# Patient Record
Sex: Female | Born: 1975
Health system: Southern US, Community
[De-identification: ages and names within clinical notes are randomized; demographics above are authoritative.]

## PROBLEM LIST (undated history)

## (undated) DIAGNOSIS — M8580 Other specified disorders of bone density and structure, unspecified site: Secondary | ICD-10-CM

## (undated) DIAGNOSIS — G8929 Other chronic pain: Secondary | ICD-10-CM

## (undated) DIAGNOSIS — F32A Depression, unspecified: Secondary | ICD-10-CM

## (undated) DIAGNOSIS — R32 Unspecified urinary incontinence: Secondary | ICD-10-CM

## (undated) DIAGNOSIS — N319 Neuromuscular dysfunction of bladder, unspecified: Secondary | ICD-10-CM

## (undated) DIAGNOSIS — M549 Dorsalgia, unspecified: Secondary | ICD-10-CM

## (undated) DIAGNOSIS — F329 Major depressive disorder, single episode, unspecified: Secondary | ICD-10-CM

## (undated) DIAGNOSIS — G8220 Paraplegia, unspecified: Secondary | ICD-10-CM

## (undated) DIAGNOSIS — F419 Anxiety disorder, unspecified: Secondary | ICD-10-CM

## (undated) DIAGNOSIS — S3992XA Unspecified injury of lower back, initial encounter: Secondary | ICD-10-CM

## (undated) HISTORY — PX: KNEE ARTHROSCOPY: SUR90

## (undated) HISTORY — DX: Other specified disorders of bone density and structure, unspecified site: M85.80

## (undated) HISTORY — DX: Unspecified urinary incontinence: R32

## (undated) HISTORY — PX: JOINT REPLACEMENT: SHX530

---

## 2001-02-01 ENCOUNTER — Ambulatory Visit (HOSPITAL_COMMUNITY): Admission: RE | Admit: 2001-02-01 | Discharge: 2001-02-01 | Payer: Self-pay | Admitting: *Deleted

## 2001-02-01 ENCOUNTER — Encounter: Payer: Self-pay | Admitting: *Deleted

## 2001-02-21 ENCOUNTER — Emergency Department (HOSPITAL_COMMUNITY): Admission: EM | Admit: 2001-02-21 | Discharge: 2001-02-21 | Payer: Self-pay | Admitting: Emergency Medicine

## 2001-03-11 ENCOUNTER — Other Ambulatory Visit: Admission: RE | Admit: 2001-03-11 | Discharge: 2001-03-11 | Payer: Self-pay | Admitting: Obstetrics and Gynecology

## 2001-08-19 ENCOUNTER — Inpatient Hospital Stay (HOSPITAL_COMMUNITY): Admission: AD | Admit: 2001-08-19 | Discharge: 2001-08-21 | Payer: Self-pay | Admitting: Obstetrics and Gynecology

## 2002-03-21 ENCOUNTER — Encounter: Payer: Self-pay | Admitting: *Deleted

## 2002-03-21 ENCOUNTER — Emergency Department (HOSPITAL_COMMUNITY): Admission: EM | Admit: 2002-03-21 | Discharge: 2002-03-21 | Payer: Self-pay | Admitting: *Deleted

## 2003-03-29 ENCOUNTER — Emergency Department (HOSPITAL_COMMUNITY): Admission: EM | Admit: 2003-03-29 | Discharge: 2003-03-29 | Payer: Self-pay | Admitting: Emergency Medicine

## 2003-07-18 ENCOUNTER — Encounter (HOSPITAL_COMMUNITY): Admission: RE | Admit: 2003-07-18 | Discharge: 2003-08-17 | Payer: Self-pay | Admitting: Emergency Medicine

## 2003-07-18 ENCOUNTER — Emergency Department (HOSPITAL_COMMUNITY): Admission: EM | Admit: 2003-07-18 | Discharge: 2003-07-18 | Payer: Self-pay | Admitting: Emergency Medicine

## 2003-08-03 ENCOUNTER — Encounter (HOSPITAL_COMMUNITY): Admission: RE | Admit: 2003-08-03 | Discharge: 2003-09-02 | Payer: Self-pay | Admitting: Podiatry

## 2003-09-05 ENCOUNTER — Encounter (HOSPITAL_COMMUNITY): Admission: RE | Admit: 2003-09-05 | Discharge: 2003-10-05 | Payer: Self-pay | Admitting: Podiatry

## 2003-10-26 ENCOUNTER — Emergency Department (HOSPITAL_COMMUNITY): Admission: EM | Admit: 2003-10-26 | Discharge: 2003-10-27 | Payer: Self-pay | Admitting: Emergency Medicine

## 2003-11-22 ENCOUNTER — Inpatient Hospital Stay (HOSPITAL_COMMUNITY): Admission: EM | Admit: 2003-11-22 | Discharge: 2003-11-26 | Payer: Self-pay | Admitting: Psychiatry

## 2005-05-05 ENCOUNTER — Ambulatory Visit: Admission: RE | Admit: 2005-05-05 | Discharge: 2005-05-05 | Payer: Self-pay | Admitting: Internal Medicine

## 2005-07-27 ENCOUNTER — Ambulatory Visit: Payer: Self-pay | Admitting: Orthopedic Surgery

## 2005-08-14 ENCOUNTER — Ambulatory Visit: Payer: Self-pay | Admitting: Orthopedic Surgery

## 2005-08-14 ENCOUNTER — Ambulatory Visit (HOSPITAL_COMMUNITY): Admission: RE | Admit: 2005-08-14 | Discharge: 2005-08-14 | Payer: Self-pay | Admitting: Orthopedic Surgery

## 2005-08-17 ENCOUNTER — Ambulatory Visit: Payer: Self-pay | Admitting: Orthopedic Surgery

## 2005-08-19 ENCOUNTER — Ambulatory Visit: Payer: Self-pay | Admitting: Orthopedic Surgery

## 2005-08-21 ENCOUNTER — Ambulatory Visit: Payer: Self-pay | Admitting: Orthopedic Surgery

## 2005-08-21 ENCOUNTER — Ambulatory Visit (HOSPITAL_COMMUNITY): Admission: RE | Admit: 2005-08-21 | Discharge: 2005-08-21 | Payer: Self-pay | Admitting: Orthopedic Surgery

## 2005-08-24 ENCOUNTER — Ambulatory Visit: Payer: Self-pay | Admitting: Orthopedic Surgery

## 2005-09-07 ENCOUNTER — Ambulatory Visit: Payer: Self-pay | Admitting: Orthopedic Surgery

## 2005-09-17 ENCOUNTER — Ambulatory Visit: Payer: Self-pay | Admitting: Orthopedic Surgery

## 2005-09-28 ENCOUNTER — Encounter: Admission: RE | Admit: 2005-09-28 | Discharge: 2005-10-27 | Payer: Self-pay | Admitting: Orthopedic Surgery

## 2005-11-16 ENCOUNTER — Ambulatory Visit: Payer: Self-pay | Admitting: Orthopedic Surgery

## 2005-11-27 ENCOUNTER — Ambulatory Visit (HOSPITAL_COMMUNITY): Admission: RE | Admit: 2005-11-27 | Discharge: 2005-11-27 | Payer: Self-pay | Admitting: Orthopedic Surgery

## 2006-02-25 ENCOUNTER — Emergency Department (HOSPITAL_COMMUNITY): Admission: EM | Admit: 2006-02-25 | Discharge: 2006-02-25 | Payer: Self-pay | Admitting: Emergency Medicine

## 2006-03-25 ENCOUNTER — Ambulatory Visit: Payer: Self-pay | Admitting: Orthopedic Surgery

## 2006-06-14 ENCOUNTER — Emergency Department (HOSPITAL_COMMUNITY): Admission: EM | Admit: 2006-06-14 | Discharge: 2006-06-14 | Payer: Self-pay | Admitting: Emergency Medicine

## 2007-02-26 ENCOUNTER — Emergency Department (HOSPITAL_COMMUNITY): Admission: EM | Admit: 2007-02-26 | Discharge: 2007-02-26 | Payer: Self-pay | Admitting: Emergency Medicine

## 2007-04-24 ENCOUNTER — Encounter: Payer: Self-pay | Admitting: Orthopedic Surgery

## 2007-05-23 ENCOUNTER — Ambulatory Visit: Payer: Self-pay | Admitting: Orthopedic Surgery

## 2007-05-23 DIAGNOSIS — M25569 Pain in unspecified knee: Secondary | ICD-10-CM | POA: Insufficient documentation

## 2007-05-23 DIAGNOSIS — M4716 Other spondylosis with myelopathy, lumbar region: Secondary | ICD-10-CM | POA: Insufficient documentation

## 2007-06-20 ENCOUNTER — Telehealth: Payer: Self-pay | Admitting: Orthopedic Surgery

## 2007-09-06 ENCOUNTER — Encounter: Admission: RE | Admit: 2007-09-06 | Discharge: 2007-11-08 | Payer: Self-pay | Admitting: Orthopedic Surgery

## 2007-09-08 ENCOUNTER — Telehealth: Payer: Self-pay | Admitting: Orthopedic Surgery

## 2007-10-12 ENCOUNTER — Other Ambulatory Visit: Admission: RE | Admit: 2007-10-12 | Discharge: 2007-10-12 | Payer: Self-pay | Admitting: Obstetrics and Gynecology

## 2008-01-03 ENCOUNTER — Inpatient Hospital Stay (HOSPITAL_COMMUNITY): Admission: AD | Admit: 2008-01-03 | Discharge: 2008-01-03 | Payer: Self-pay | Admitting: Obstetrics & Gynecology

## 2008-05-09 ENCOUNTER — Ambulatory Visit: Payer: Self-pay | Admitting: Obstetrics and Gynecology

## 2008-05-09 ENCOUNTER — Inpatient Hospital Stay (HOSPITAL_COMMUNITY): Admission: AD | Admit: 2008-05-09 | Discharge: 2008-05-09 | Payer: Self-pay | Admitting: Obstetrics and Gynecology

## 2008-05-10 ENCOUNTER — Inpatient Hospital Stay (HOSPITAL_COMMUNITY): Admission: AD | Admit: 2008-05-10 | Discharge: 2008-05-12 | Payer: Self-pay | Admitting: Obstetrics & Gynecology

## 2008-05-10 ENCOUNTER — Ambulatory Visit: Payer: Self-pay | Admitting: Obstetrics & Gynecology

## 2009-09-22 ENCOUNTER — Ambulatory Visit: Payer: Self-pay | Admitting: Vascular Surgery

## 2009-09-22 ENCOUNTER — Emergency Department (HOSPITAL_COMMUNITY): Admission: EM | Admit: 2009-09-22 | Discharge: 2009-09-22 | Payer: Self-pay | Admitting: Emergency Medicine

## 2010-07-22 LAB — RH IMMUNE GLOB WKUP(>/=20WKS)(NOT WOMEN'S HOSP)

## 2010-07-22 LAB — CBC
HCT: 40.7 % (ref 36.0–46.0)
Hemoglobin: 13.8 g/dL (ref 12.0–15.0)
Platelets: 191 10*3/uL (ref 150–400)
RBC: 4 MIL/uL (ref 3.87–5.11)

## 2010-07-22 LAB — RAPID URINE DRUG SCREEN, HOSP PERFORMED
Benzodiazepines: NOT DETECTED
Opiates: NOT DETECTED

## 2010-07-22 LAB — RPR: RPR Ser Ql: NONREACTIVE

## 2010-08-22 NOTE — Op Note (Signed)
NAME:  Kim [name], Kim [name] NO.:  [id number]   MEDICAL RECORD NO.:  [id number]          PATIENT TYPE:  AMB   LOCATION:  DAY                           FACILITY:  [facility]   PHYSICIAN:  Vickki Hearing, M.D.DATE OF BIRTH:  02-18-1976   DATE OF PROCEDURE:  08/21/2005  DATE OF DISCHARGE:                                 OPERATIVE REPORT   PREOPERATIVE DIAGNOSIS:  Wound dehiscence and hematoma left knee.   POSTOPERATIVE DIAGNOSIS:  Wound dehiscence and hematoma left knee.   PROCEDURE:  Evacuation of hematoma, closure of wound left knee.   SURGEON:  Vickki Hearing, M.D.   ASSISTANT:  No assistants.   ANESTHETIC:  Local lidocaine 1% with epinephrine.   INDICATIONS FOR PROCEDURE AND HISTORY:  Ms. Cagley had a left knee  arthroscopy.  She had two large loose bodies removed from the left knee  through a suprapatellar lateral portal.  The sutures were removed in the  office, and 2 days later the wound started to drain dark clotted hematoma,  so we booked her for surgery to have the hematoma evacuated and the wound  closed.   The patient was identified as [name].  Her left knee was confirmed  as the surgical site.  The wound was prepped and draped with Betadine.  Local 1% lidocaine with epinephrine was used to anesthetize the wound.  Clot  was evacuated from the wound. and the wound was closed in layered fashion  with 2-0 Monocryl and 3-0 nylon in interrupted fashion, and a dressing was  applied with a knee immobilizer, and the patient was discharged home with  Percocet and Levaquin 500 mg 1 a day for 7 days.  The Percocet is a 5 mg  tablet, #40. The patient will follow up on Monday for wound check. Sutures  should stay in for approximately 13 days.      Vickki Hearing, M.D.  Electronically Signed     SEH/MEDQ  D:  08/21/2005  T:  08/21/2005  Job:  [id number]

## 2010-08-22 NOTE — H&P (Signed)
NAME:  Kim [name], Kim [name] NO.:  [id number]   MEDICAL RECORD NO.:  [id number]          PATIENT TYPE:  AMB   LOCATION:  DAY                           FACILITY:  [facility]   PHYSICIAN:  Vickki Hearing, M.D.DATE OF BIRTH:  05-Nov-1975   DATE OF ADMISSION:  DATE OF DISCHARGE:  LH                                HISTORY & PHYSICAL   CHIEF COMPLAINT:  Drainage from left knee.   MEDICAL HISTORY:  She is 35 years old. She is a L3 level paraplegic. She had  an arthroscopic to remove two loose bodies from the left knee and develop  postoperative wound drainage and hematoma formation. She is coming in today  for evacuation of the hematoma manually and closure of the wound.   OTHER HISTORY:  Osteoporosis. No allergies. Spinal cord injury in 1990. No  other previous surgery. She takes oxybutynin for her bladder. Takes Flexeril  and Percocet.   FAMILY HISTORY:  Arthritis, cancer and diabetes.   FAMILY PHYSICIAN:  Dr. Olena Leatherwood.   SOCIAL HISTORY:  The patient is single, mother of five. She is a Consulting civil engineer.   PHYSICAL EXAMINATION:  VITAL SIGNS:  Weight approximately 150, pulse 92,  respiratory rate 16.  EXTREMITIES:  Her left knee arthrotomy incision is opened up. There is  drainage from the wound.   I had left it open purposely to allow it to drain. I will clean it up and  numb and close the wound on  Friday. She came to the office on Wednesday.   There are no other surrounding sounds of the infection of the knee joint  itself. Passive range of motion is uncomfortable but not rigid or causing  extreme pain. She does have some narcotic tolerance.   She has a large bruise over the lateral portion of the leg which is related  to the hematoma.   IMPRESSION:  Hematoma and wound opening. Recommend closure and evacuation of  hematoma.      Vickki Hearing, M.D.  Electronically Signed     SEH/MEDQ  D:  08/19/2005  T:  08/19/2005  Job:  [id number]

## 2010-08-22 NOTE — Op Note (Signed)
NAME:  Kim [name], Kim [name] NO.:  [id number]   MEDICAL RECORD NO.:  [id number]          PATIENT TYPE:  AMB   LOCATION:  DAY                           FACILITY:  [facility]   PHYSICIAN:  Vickki Hearing, M.D.DATE OF BIRTH:  [birth year]   DATE OF PROCEDURE:  08/14/2005  DATE OF DISCHARGE:                                 OPERATIVE REPORT   PREOPERATIVE DIAGNOSIS:  Loose bodies left knee, degenerative joint disease.   POSTOPERATIVE DIAGNOSIS:  Loose bodies left knee, degenerative joint  disease.   PROCEDURE:  Arthroscopy left knee, removal of loose bodies, chondroplasty  medial femoral condyle.   OPERATIVE FINDINGS:  There were two large loose bodies in the lateral  gutter. There was a pedunculated soft tissue and the notch appeared to be  coming from the ACL.  There was a chondral blister over the medial femoral  condyle which was debrided. Underneath this blister was exposed bone.  There  was degeneration of the joint, I believe this to be a neuropathic joint.   SURGEON:  Vickki Hearing, M.D.   ASSISTANT:  None.   ANESTHESIA:  General.   SPECIMENS:  No specimens.   BLOOD LOSS:  Minimal.   COMPLICATIONS:  None.   DISPOSITION:  The patient went to PACU in good condition.   INDICATIONS FOR PROCEDURE:  This patient, who is an L3 level spinal cord  injury patient, still had intact knee extension, was having a feeling that  something was moving around and getting caught in the joint when she played  on the floor with her son.  She was initially told she needed a knee  replacement and came to me for a second opinion. Radiographs showed loose  bodies and I recommended arthroscopic removal.   DESCRIPTION OF PROCEDURE:  This patient was identified in the preop holding  area. She marked her left knee as a surgical site, I counter signed that. I  updated her history and physical and she was given Ancef.  She was taken to  the operating room for general anesthesia  via LMA.  After successful  anesthesia, the patient was placed supine on the operating table where her  left leg was prepped with DuraPrep. After sterile draping, the time-out was  taken and completed successfully.   We then performed a diagnostic arthroscopy via lateral portal. After finding  the loose body and pedunculated lesion, we made an initial attempt to remove  the loose bodies through the medial portal.  However, the loose bodies were  too big.  So a suprapatellar lateral portal mini-arthrotomy was performed  and loose bodies were removed via that portal.  The knee was irrigated,  inspected for any further loose pieces of cartilage or bone, there were  none.  We debrided the pedunculated lesion in the notch, controlled  hemostasis with an ArthroCare wand, and irrigated the knee.  We closed the  arthrotomy portal with 3-0 nylon as well as closing the portals with 3-0  nylon.  We applied sterile dressings. We placed Ace bandage and a Cryo/Cuff.  She was extubated and taken to the recovery  room in stable condition.  She  will be followed on Monday, to use a Cryo/Cuff over the weekend, no change  of dressing is needed.      Vickki Hearing, M.D.  Electronically Signed     SEH/MEDQ  D:  08/14/2005  T:  08/14/2005  Job:  [id number]
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
## 2010-12-03 ENCOUNTER — Encounter: Payer: Self-pay | Admitting: *Deleted

## 2010-12-03 ENCOUNTER — Emergency Department (HOSPITAL_COMMUNITY)
Admission: EM | Admit: 2010-12-03 | Discharge: 2010-12-03 | Disposition: A | Discharge status: Home / Self Care | Payer: Medicare Other | Attending: Emergency Medicine | Admitting: Emergency Medicine

## 2010-12-03 DIAGNOSIS — M25569 Pain in unspecified knee: Secondary | ICD-10-CM | POA: Insufficient documentation

## 2010-12-03 DIAGNOSIS — F17200 Nicotine dependence, unspecified, uncomplicated: Secondary | ICD-10-CM | POA: Insufficient documentation

## 2010-12-03 DIAGNOSIS — M79609 Pain in unspecified limb: Secondary | ICD-10-CM | POA: Insufficient documentation

## 2010-12-03 DIAGNOSIS — W050XXA Fall from non-moving wheelchair, initial encounter: Secondary | ICD-10-CM | POA: Insufficient documentation

## 2010-12-03 DIAGNOSIS — M549 Dorsalgia, unspecified: Secondary | ICD-10-CM | POA: Insufficient documentation

## 2010-12-03 DIAGNOSIS — Y92009 Unspecified place in unspecified non-institutional (private) residence as the place of occurrence of the external cause: Secondary | ICD-10-CM | POA: Insufficient documentation

## 2010-12-03 DIAGNOSIS — T148XXA Other injury of unspecified body region, initial encounter: Secondary | ICD-10-CM

## 2010-12-03 HISTORY — DX: Paraplegia, unspecified: G82.20

## 2010-12-03 HISTORY — DX: Neuromuscular dysfunction of bladder, unspecified: N31.9

## 2010-12-03 HISTORY — DX: Unspecified injury of lower back, initial encounter: S39.92XA

## 2010-12-03 MED ORDER — HYDROCODONE-ACETAMINOPHEN 5-325 MG PO TABS: ORAL_TABLET | ORAL | Status: DC

## 2010-12-03 MED ORDER — IBUPROFEN 800 MG PO TABS: 800.0000 mg | ORAL_TABLET | Freq: Three times a day (TID) | ORAL | Status: AC

## 2010-12-03 NOTE — ED Notes (Signed)
Pt states that she fell yesterday, states that she has recently moved to a new residence and the cabinets are higher, pt was attempting to stand up when she fell, pt c/o pain to lower back, left knee, right thigh and spasms to left foot.
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
## 2010-12-04 NOTE — ED Provider Notes (Signed)
Medical screening examination/treatment/procedure(s) were performed by non-physician practitioner and as supervising physician I was immediately available for consultation/collaboration.   Vida Roller, MD 12/04/10 [phone]

## 2010-12-16 ENCOUNTER — Other Ambulatory Visit: Payer: Self-pay | Admitting: Advanced Practice Midwife

## 2010-12-16 DIAGNOSIS — IMO0002 Reserved for concepts with insufficient information to code with codable children: Secondary | ICD-10-CM

## 2010-12-30 ENCOUNTER — Encounter (HOSPITAL_COMMUNITY): Payer: Self-pay

## 2010-12-30 ENCOUNTER — Emergency Department (HOSPITAL_COMMUNITY)
Admission: EM | Admit: 2010-12-30 | Discharge: 2010-12-30 | Disposition: A | Discharge status: Home / Self Care | Payer: Medicare Other | Attending: Emergency Medicine | Admitting: Emergency Medicine

## 2010-12-30 ENCOUNTER — Emergency Department (HOSPITAL_COMMUNITY): Payer: Medicare Other

## 2010-12-30 DIAGNOSIS — R296 Repeated falls: Secondary | ICD-10-CM | POA: Insufficient documentation

## 2010-12-30 DIAGNOSIS — F17200 Nicotine dependence, unspecified, uncomplicated: Secondary | ICD-10-CM | POA: Insufficient documentation

## 2010-12-30 DIAGNOSIS — G839 Paralytic syndrome, unspecified: Secondary | ICD-10-CM | POA: Insufficient documentation

## 2010-12-30 DIAGNOSIS — M25561 Pain in right knee: Secondary | ICD-10-CM

## 2010-12-30 DIAGNOSIS — S8000XA Contusion of unspecified knee, initial encounter: Secondary | ICD-10-CM | POA: Insufficient documentation

## 2010-12-30 DIAGNOSIS — W19XXXA Unspecified fall, initial encounter: Secondary | ICD-10-CM

## 2010-12-30 DIAGNOSIS — M25569 Pain in unspecified knee: Secondary | ICD-10-CM | POA: Insufficient documentation

## 2010-12-30 DIAGNOSIS — Y92009 Unspecified place in unspecified non-institutional (private) residence as the place of occurrence of the external cause: Secondary | ICD-10-CM | POA: Insufficient documentation

## 2010-12-30 MED ORDER — HYDROCODONE-ACETAMINOPHEN 5-325 MG PO TABS
1.0000 | ORAL_TABLET | Freq: Once | ORAL | Status: AC
  Administered 2010-12-30: 1 via ORAL
  Filled 2010-12-30: qty 1

## 2010-12-30 MED ORDER — IBUPROFEN 800 MG PO TABS
800.0000 mg | ORAL_TABLET | Freq: Once | ORAL | Status: AC
  Administered 2010-12-30: 800 mg via ORAL
  Filled 2010-12-30: qty 1

## 2010-12-30 MED ORDER — HYDROCODONE-ACETAMINOPHEN 5-325 MG PO TABS: ORAL_TABLET | ORAL | Status: AC
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
## 2010-12-30 NOTE — ED Notes (Signed)
Pt seen here 8/29 for same injury. Referred to Dr. Phillips Odor.

## 2010-12-31 ENCOUNTER — Other Ambulatory Visit: Payer: Self-pay | Admitting: Advanced Practice Midwife

## 2010-12-31 ENCOUNTER — Ambulatory Visit (HOSPITAL_COMMUNITY)
Admission: RE | Admit: 2010-12-31 | Discharge: 2010-12-31 | Disposition: A | Discharge status: Home / Self Care | Payer: Medicare Other | Source: Ambulatory Visit | Attending: Advanced Practice Midwife | Admitting: Advanced Practice Midwife

## 2010-12-31 DIAGNOSIS — IMO0002 Reserved for concepts with insufficient information to code with codable children: Secondary | ICD-10-CM

## 2010-12-31 DIAGNOSIS — N63 Unspecified lump in unspecified breast: Secondary | ICD-10-CM | POA: Insufficient documentation

## 2011-01-01 ENCOUNTER — Other Ambulatory Visit (HOSPITAL_COMMUNITY): Payer: Self-pay | Admitting: Orthopaedic Surgery

## 2011-01-01 DIAGNOSIS — W19XXXA Unspecified fall, initial encounter: Secondary | ICD-10-CM

## 2011-01-01 DIAGNOSIS — M25469 Effusion, unspecified knee: Secondary | ICD-10-CM

## 2011-01-01 DIAGNOSIS — M25562 Pain in left knee: Secondary | ICD-10-CM

## 2011-01-06 ENCOUNTER — Ambulatory Visit (HOSPITAL_COMMUNITY)
Admission: RE | Admit: 2011-01-06 | Discharge: 2011-01-06 | Disposition: A | Discharge status: Home / Self Care | Payer: Medicare Other | Source: Ambulatory Visit | Attending: Orthopaedic Surgery | Admitting: Orthopaedic Surgery

## 2011-01-06 DIAGNOSIS — M25469 Effusion, unspecified knee: Secondary | ICD-10-CM

## 2011-01-06 DIAGNOSIS — W19XXXA Unspecified fall, initial encounter: Secondary | ICD-10-CM

## 2011-01-06 DIAGNOSIS — M25562 Pain in left knee: Secondary | ICD-10-CM

## 2011-01-13 ENCOUNTER — Ambulatory Visit (HOSPITAL_COMMUNITY)
Admission: RE | Admit: 2011-01-13 | Discharge: 2011-01-13 | Disposition: A | Discharge status: Home / Self Care | Payer: Medicare Other | Source: Ambulatory Visit | Attending: Orthopaedic Surgery | Admitting: Orthopaedic Surgery

## 2011-01-13 DIAGNOSIS — S8990XA Unspecified injury of unspecified lower leg, initial encounter: Secondary | ICD-10-CM | POA: Insufficient documentation

## 2011-01-13 DIAGNOSIS — W19XXXA Unspecified fall, initial encounter: Secondary | ICD-10-CM | POA: Insufficient documentation

## 2011-01-13 DIAGNOSIS — S99929A Unspecified injury of unspecified foot, initial encounter: Secondary | ICD-10-CM | POA: Insufficient documentation

## 2011-01-13 DIAGNOSIS — M2340 Loose body in knee, unspecified knee: Secondary | ICD-10-CM | POA: Insufficient documentation

## 2011-01-13 DIAGNOSIS — M25569 Pain in unspecified knee: Secondary | ICD-10-CM | POA: Insufficient documentation

## 2011-02-08 DIAGNOSIS — M25562 Pain in left knee: Secondary | ICD-10-CM | POA: Insufficient documentation

## 2011-03-23 DIAGNOSIS — M47815 Spondylosis without myelopathy or radiculopathy, thoracolumbar region: Secondary | ICD-10-CM | POA: Insufficient documentation

## 2011-03-23 DIAGNOSIS — M179 Osteoarthritis of knee, unspecified: Secondary | ICD-10-CM | POA: Insufficient documentation

## 2011-04-18 ENCOUNTER — Emergency Department (HOSPITAL_COMMUNITY): Payer: Medicare Other

## 2011-04-18 ENCOUNTER — Emergency Department (HOSPITAL_COMMUNITY)
Admission: EM | Admit: 2011-04-18 | Discharge: 2011-04-18 | Disposition: A | Discharge status: Home / Self Care | Payer: Medicare Other | Attending: Emergency Medicine | Admitting: Emergency Medicine

## 2011-04-18 ENCOUNTER — Encounter (HOSPITAL_COMMUNITY): Payer: Self-pay | Admitting: Emergency Medicine

## 2011-04-18 DIAGNOSIS — G8929 Other chronic pain: Secondary | ICD-10-CM | POA: Diagnosis not present

## 2011-04-18 DIAGNOSIS — F341 Dysthymic disorder: Secondary | ICD-10-CM | POA: Diagnosis not present

## 2011-04-18 DIAGNOSIS — M25559 Pain in unspecified hip: Secondary | ICD-10-CM | POA: Insufficient documentation

## 2011-04-18 DIAGNOSIS — M545 Low back pain, unspecified: Secondary | ICD-10-CM | POA: Insufficient documentation

## 2011-04-18 DIAGNOSIS — M1610 Unilateral primary osteoarthritis, unspecified hip: Secondary | ICD-10-CM | POA: Diagnosis not present

## 2011-04-18 DIAGNOSIS — M810 Age-related osteoporosis without current pathological fracture: Secondary | ICD-10-CM | POA: Insufficient documentation

## 2011-04-18 DIAGNOSIS — M25551 Pain in right hip: Secondary | ICD-10-CM

## 2011-04-18 DIAGNOSIS — M169 Osteoarthritis of hip, unspecified: Secondary | ICD-10-CM | POA: Diagnosis not present

## 2011-04-18 DIAGNOSIS — M1990 Unspecified osteoarthritis, unspecified site: Secondary | ICD-10-CM

## 2011-04-18 HISTORY — DX: Other chronic pain: G89.29

## 2011-04-18 HISTORY — DX: Anxiety disorder, unspecified: F41.9

## 2011-04-18 HISTORY — DX: Major depressive disorder, single episode, unspecified: F32.9

## 2011-04-18 HISTORY — DX: Dorsalgia, unspecified: M54.9

## 2011-04-18 HISTORY — DX: Depression, unspecified: F32.A

## 2011-04-18 MED ORDER — ONDANSETRON HCL 4 MG PO TABS
4.0000 mg | ORAL_TABLET | Freq: Once | ORAL | Status: AC
  Administered 2011-04-18: 4 mg via ORAL
  Filled 2011-04-18: qty 1

## 2011-04-18 MED ORDER — HYDROCODONE-ACETAMINOPHEN 5-325 MG PO TABS: 1.0000 | ORAL_TABLET | ORAL | Status: AC | PRN

## 2011-04-18 MED ORDER — HYDROCODONE-ACETAMINOPHEN 5-325 MG PO TABS
2.0000 | ORAL_TABLET | Freq: Once | ORAL | Status: AC
  Administered 2011-04-18: 2 via ORAL
  Filled 2011-04-18: qty 2

## 2011-04-18 MED ORDER — IBUPROFEN 800 MG PO TABS
800.0000 mg | ORAL_TABLET | Freq: Once | ORAL | Status: AC
  Administered 2011-04-18: 800 mg via ORAL
  Filled 2011-04-18: qty 1

## 2011-04-18 MED ORDER — DICLOFENAC SODIUM 75 MG PO TBEC: DELAYED_RELEASE_TABLET | ORAL | Status: DC

## 2011-04-18 NOTE — ED Notes (Signed)
Pt a/ox4. Resp even and unlabored. NAD at this time. D/C instructions reviewed with pt. Pt verbalized understanding. Pt to lobby via w/c.  

## 2011-04-18 NOTE — ED Notes (Signed)
Pt presents with lower right sided back pain that radiates to right hip. Pt denies injury. Pt states she has an appointment with a spine specialist in Feb. Pt ambulated to treatment room with steady gate. NAD at this time.

## 2011-04-18 NOTE — ED Notes (Signed)
Patient c/o lower right back that radiates into right hip. Patient reports appointment with spinal specialist on Feb 19.
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
## 2011-04-19 NOTE — ED Provider Notes (Signed)
Medical screening examination/treatment/procedure(s) were performed by non-physician practitioner and as supervising physician I was immediately available for consultation/collaboration.  Caylan Chenard L Dawne Casali, MD 04/19/11 0033 

## 2011-05-26 DIAGNOSIS — IMO0002 Reserved for concepts with insufficient information to code with codable children: Secondary | ICD-10-CM | POA: Diagnosis not present

## 2011-05-26 DIAGNOSIS — M545 Low back pain, unspecified: Secondary | ICD-10-CM | POA: Insufficient documentation

## 2011-05-26 DIAGNOSIS — S343XXA Injury of cauda equina, initial encounter: Secondary | ICD-10-CM | POA: Insufficient documentation

## 2011-06-08 ENCOUNTER — Other Ambulatory Visit: Payer: Self-pay

## 2011-06-08 ENCOUNTER — Other Ambulatory Visit (HOSPITAL_COMMUNITY): Payer: Self-pay | Admitting: Orthopedic Surgery

## 2011-06-08 DIAGNOSIS — M545 Low back pain: Secondary | ICD-10-CM

## 2011-06-09 ENCOUNTER — Other Ambulatory Visit (HOSPITAL_COMMUNITY): Payer: Medicare Other

## 2011-06-23 DIAGNOSIS — Z3202 Encounter for pregnancy test, result negative: Secondary | ICD-10-CM | POA: Diagnosis not present

## 2011-06-23 DIAGNOSIS — N915 Oligomenorrhea, unspecified: Secondary | ICD-10-CM | POA: Diagnosis not present

## 2011-06-23 DIAGNOSIS — N946 Dysmenorrhea, unspecified: Secondary | ICD-10-CM | POA: Diagnosis not present

## 2011-06-23 DIAGNOSIS — Z1389 Encounter for screening for other disorder: Secondary | ICD-10-CM | POA: Diagnosis not present

## 2011-06-24 ENCOUNTER — Other Ambulatory Visit (HOSPITAL_COMMUNITY): Payer: Self-pay | Admitting: *Deleted

## 2011-06-24 DIAGNOSIS — M545 Low back pain: Secondary | ICD-10-CM

## 2011-06-29 ENCOUNTER — Inpatient Hospital Stay (HOSPITAL_COMMUNITY): Admission: RE | Admit: 2011-06-29 | Payer: Medicare Other | Source: Ambulatory Visit

## 2011-06-29 ENCOUNTER — Other Ambulatory Visit (HOSPITAL_COMMUNITY): Payer: Self-pay

## 2011-07-23 ENCOUNTER — Encounter (HOSPITAL_COMMUNITY): Payer: Self-pay | Admitting: *Deleted

## 2011-07-23 ENCOUNTER — Emergency Department (HOSPITAL_COMMUNITY): Payer: Medicare Other

## 2011-07-23 ENCOUNTER — Emergency Department (HOSPITAL_COMMUNITY)
Admission: EM | Admit: 2011-07-23 | Discharge: 2011-07-23 | Disposition: A | Discharge status: Home / Self Care | Payer: Medicare Other | Attending: Emergency Medicine | Admitting: Emergency Medicine

## 2011-07-23 DIAGNOSIS — G839 Paralytic syndrome, unspecified: Secondary | ICD-10-CM | POA: Insufficient documentation

## 2011-07-23 DIAGNOSIS — S81009A Unspecified open wound, unspecified knee, initial encounter: Secondary | ICD-10-CM | POA: Insufficient documentation

## 2011-07-23 DIAGNOSIS — Z87828 Personal history of other (healed) physical injury and trauma: Secondary | ICD-10-CM | POA: Diagnosis not present

## 2011-07-23 DIAGNOSIS — M545 Low back pain, unspecified: Secondary | ICD-10-CM | POA: Diagnosis not present

## 2011-07-23 DIAGNOSIS — L97409 Non-pressure chronic ulcer of unspecified heel and midfoot with unspecified severity: Secondary | ICD-10-CM | POA: Insufficient documentation

## 2011-07-23 DIAGNOSIS — F17200 Nicotine dependence, unspecified, uncomplicated: Secondary | ICD-10-CM | POA: Diagnosis not present

## 2011-07-23 DIAGNOSIS — W050XXA Fall from non-moving wheelchair, initial encounter: Secondary | ICD-10-CM | POA: Insufficient documentation

## 2011-07-23 DIAGNOSIS — S91009A Unspecified open wound, unspecified ankle, initial encounter: Secondary | ICD-10-CM | POA: Diagnosis not present

## 2011-07-23 DIAGNOSIS — M7989 Other specified soft tissue disorders: Secondary | ICD-10-CM | POA: Diagnosis not present

## 2011-07-23 DIAGNOSIS — M533 Sacrococcygeal disorders, not elsewhere classified: Secondary | ICD-10-CM | POA: Diagnosis not present

## 2011-07-23 DIAGNOSIS — S81809A Unspecified open wound, unspecified lower leg, initial encounter: Secondary | ICD-10-CM | POA: Diagnosis not present

## 2011-07-23 DIAGNOSIS — S81819A Laceration without foreign body, unspecified lower leg, initial encounter: Secondary | ICD-10-CM

## 2011-07-23 MED ORDER — TRAMADOL HCL 50 MG PO TABS: ORAL_TABLET | ORAL | Status: DC

## 2011-07-23 NOTE — Discharge Instructions (Signed)
Your xrays do not show any fractures. Your skin loss on your right heel should heal well, use the triple antibiotic ointment on it as we discussed and elevate your heel in the air by placing a pillow under your ankle so the skin doesn't keep getting rubbed off. Recheck if you get a fever, see a red streak, increased redness of swelling of your heal/lower leg or the area starts to drain pus.

## 2011-07-23 NOTE — ED Notes (Signed)
Pt alert and oriented x 3. Skin warm and dry. Color pink. Breath sounds clear and equal bilaterally. Pt has large abrasion on right heel that is red and oozing small amount serous drainage. Pt c/o pain in her lower back radiating to her right hip. Pt states that she fell out of her wheelchair while going backwards down the steps. Pt denies hitting her head or loss of consciousness.
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
## 2011-08-26 ENCOUNTER — Other Ambulatory Visit (HOSPITAL_COMMUNITY): Payer: Self-pay | Admitting: Orthopedic Surgery

## 2011-08-26 DIAGNOSIS — M545 Low back pain: Secondary | ICD-10-CM

## 2011-09-01 ENCOUNTER — Ambulatory Visit (HOSPITAL_COMMUNITY): Admission: RE | Admit: 2011-09-01 | Payer: Medicare Other | Source: Ambulatory Visit

## 2011-09-04 DIAGNOSIS — M545 Low back pain: Secondary | ICD-10-CM | POA: Diagnosis not present

## 2011-09-04 DIAGNOSIS — Z79899 Other long term (current) drug therapy: Secondary | ICD-10-CM | POA: Diagnosis not present

## 2011-09-04 DIAGNOSIS — G608 Other hereditary and idiopathic neuropathies: Secondary | ICD-10-CM | POA: Diagnosis not present

## 2011-09-04 DIAGNOSIS — M629 Disorder of muscle, unspecified: Secondary | ICD-10-CM | POA: Diagnosis not present

## 2011-09-04 DIAGNOSIS — H81399 Other peripheral vertigo, unspecified ear: Secondary | ICD-10-CM | POA: Diagnosis not present

## 2011-09-04 DIAGNOSIS — M533 Sacrococcygeal disorders, not elsewhere classified: Secondary | ICD-10-CM | POA: Diagnosis not present

## 2011-09-19 ENCOUNTER — Encounter (HOSPITAL_COMMUNITY): Payer: Self-pay | Admitting: *Deleted

## 2011-09-19 ENCOUNTER — Emergency Department (HOSPITAL_COMMUNITY): Payer: Medicare Other

## 2011-09-19 ENCOUNTER — Emergency Department (HOSPITAL_COMMUNITY)
Admission: EM | Admit: 2011-09-19 | Discharge: 2011-09-19 | Disposition: A | Discharge status: Home / Self Care | Payer: Medicare Other | Attending: Emergency Medicine | Admitting: Emergency Medicine

## 2011-09-19 DIAGNOSIS — M25562 Pain in left knee: Secondary | ICD-10-CM

## 2011-09-19 DIAGNOSIS — F411 Generalized anxiety disorder: Secondary | ICD-10-CM | POA: Diagnosis not present

## 2011-09-19 DIAGNOSIS — M1710 Unilateral primary osteoarthritis, unspecified knee: Secondary | ICD-10-CM | POA: Diagnosis not present

## 2011-09-19 DIAGNOSIS — G8929 Other chronic pain: Secondary | ICD-10-CM | POA: Diagnosis not present

## 2011-09-19 DIAGNOSIS — F329 Major depressive disorder, single episode, unspecified: Secondary | ICD-10-CM | POA: Diagnosis not present

## 2011-09-19 DIAGNOSIS — M810 Age-related osteoporosis without current pathological fracture: Secondary | ICD-10-CM | POA: Insufficient documentation

## 2011-09-19 DIAGNOSIS — M25569 Pain in unspecified knee: Secondary | ICD-10-CM | POA: Diagnosis not present

## 2011-09-19 DIAGNOSIS — F17200 Nicotine dependence, unspecified, uncomplicated: Secondary | ICD-10-CM | POA: Insufficient documentation

## 2011-09-19 DIAGNOSIS — F3289 Other specified depressive episodes: Secondary | ICD-10-CM | POA: Insufficient documentation

## 2011-09-19 MED ORDER — HYDROMORPHONE HCL PF 1 MG/ML IJ SOLN
1.0000 mg | Freq: Once | INTRAMUSCULAR | Status: AC
  Administered 2011-09-19: 1 mg via INTRAMUSCULAR
  Filled 2011-09-19: qty 1

## 2011-09-19 MED ORDER — OXYCODONE-ACETAMINOPHEN 5-325 MG PO TABS: 1.0000 | ORAL_TABLET | Freq: Four times a day (QID) | ORAL | Status: AC | PRN

## 2011-09-19 NOTE — Discharge Instructions (Signed)
Xray showed no fracture.  Ice.  Pain meds.  F/u your orthopedic dr.  Donivan Scull wrap
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
## 2011-09-19 NOTE — ED Notes (Signed)
Pain in left knee, states she is usually able to crawl and today she cannot crawl due to pain in left knee, paralyzed in both feet , history of osteoporosis

## 2011-09-23 ENCOUNTER — Ambulatory Visit (HOSPITAL_COMMUNITY): Payer: Medicare Other

## 2011-09-24 ENCOUNTER — Ambulatory Visit (HOSPITAL_COMMUNITY)
Admission: RE | Admit: 2011-09-24 | Discharge: 2011-09-24 | Disposition: A | Discharge status: Home / Self Care | Payer: Medicare Other | Source: Ambulatory Visit | Attending: Orthopedic Surgery | Admitting: Orthopedic Surgery

## 2011-09-24 DIAGNOSIS — M545 Low back pain, unspecified: Secondary | ICD-10-CM | POA: Diagnosis not present

## 2011-09-24 DIAGNOSIS — G839 Paralytic syndrome, unspecified: Secondary | ICD-10-CM | POA: Insufficient documentation

## 2011-09-24 DIAGNOSIS — M5137 Other intervertebral disc degeneration, lumbosacral region: Secondary | ICD-10-CM | POA: Insufficient documentation

## 2011-09-24 DIAGNOSIS — M51379 Other intervertebral disc degeneration, lumbosacral region without mention of lumbar back pain or lower extremity pain: Secondary | ICD-10-CM | POA: Insufficient documentation

## 2011-10-02 DIAGNOSIS — G608 Other hereditary and idiopathic neuropathies: Secondary | ICD-10-CM | POA: Diagnosis not present

## 2011-10-02 DIAGNOSIS — M545 Low back pain: Secondary | ICD-10-CM | POA: Diagnosis not present

## 2011-10-02 DIAGNOSIS — M533 Sacrococcygeal disorders, not elsewhere classified: Secondary | ICD-10-CM | POA: Diagnosis not present

## 2011-10-02 DIAGNOSIS — H81399 Other peripheral vertigo, unspecified ear: Secondary | ICD-10-CM | POA: Diagnosis not present

## 2011-10-02 DIAGNOSIS — Z79899 Other long term (current) drug therapy: Secondary | ICD-10-CM | POA: Diagnosis not present

## 2011-11-06 DIAGNOSIS — H81399 Other peripheral vertigo, unspecified ear: Secondary | ICD-10-CM | POA: Diagnosis not present

## 2011-11-06 DIAGNOSIS — G608 Other hereditary and idiopathic neuropathies: Secondary | ICD-10-CM | POA: Diagnosis not present

## 2011-11-06 DIAGNOSIS — M545 Low back pain: Secondary | ICD-10-CM | POA: Diagnosis not present

## 2011-11-06 DIAGNOSIS — M533 Sacrococcygeal disorders, not elsewhere classified: Secondary | ICD-10-CM | POA: Diagnosis not present

## 2011-11-12 ENCOUNTER — Emergency Department (HOSPITAL_COMMUNITY)
Admission: EM | Admit: 2011-11-12 | Discharge: 2011-11-12 | Disposition: A | Discharge status: Home / Self Care | Payer: Medicare Other | Attending: Emergency Medicine | Admitting: Emergency Medicine

## 2011-11-12 ENCOUNTER — Emergency Department (HOSPITAL_COMMUNITY): Payer: Medicare Other

## 2011-11-12 ENCOUNTER — Encounter (HOSPITAL_COMMUNITY): Payer: Self-pay | Admitting: *Deleted

## 2011-11-12 DIAGNOSIS — G8929 Other chronic pain: Secondary | ICD-10-CM | POA: Insufficient documentation

## 2011-11-12 DIAGNOSIS — L97509 Non-pressure chronic ulcer of other part of unspecified foot with unspecified severity: Secondary | ICD-10-CM | POA: Insufficient documentation

## 2011-11-12 DIAGNOSIS — F17200 Nicotine dependence, unspecified, uncomplicated: Secondary | ICD-10-CM | POA: Insufficient documentation

## 2011-11-12 DIAGNOSIS — M810 Age-related osteoporosis without current pathological fracture: Secondary | ICD-10-CM | POA: Insufficient documentation

## 2011-11-12 DIAGNOSIS — L8990 Pressure ulcer of unspecified site, unspecified stage: Secondary | ICD-10-CM | POA: Diagnosis not present

## 2011-11-12 DIAGNOSIS — L89609 Pressure ulcer of unspecified heel, unspecified stage: Secondary | ICD-10-CM | POA: Diagnosis not present

## 2011-11-12 DIAGNOSIS — L97519 Non-pressure chronic ulcer of other part of right foot with unspecified severity: Secondary | ICD-10-CM

## 2011-11-12 DIAGNOSIS — L97409 Non-pressure chronic ulcer of unspecified heel and midfoot with unspecified severity: Secondary | ICD-10-CM | POA: Diagnosis not present

## 2011-11-12 MED ORDER — CLINDAMYCIN HCL 300 MG PO CAPS: 300.0000 mg | ORAL_CAPSULE | Freq: Four times a day (QID) | ORAL | Status: AC

## 2011-11-12 NOTE — ED Notes (Addendum)
Infection, malodorous for 2-3 days.  Rt foot.  Heel is black.  No feeling in feet.

## 2011-11-12 NOTE — ED Notes (Signed)
Pt stable at discharge Pt instructed to take all antibotics till finished Pt verbalized understanding

## 2011-11-12 NOTE — ED Notes (Signed)
Right heel is back with odor present; Pt states that she hit it a month ago, and that the infection just started.
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
## 2011-12-03 ENCOUNTER — Encounter: Payer: Self-pay | Admitting: Family Medicine

## 2011-12-03 ENCOUNTER — Ambulatory Visit: Payer: Medicare Other | Admitting: Family Medicine

## 2011-12-04 DIAGNOSIS — Z79899 Other long term (current) drug therapy: Secondary | ICD-10-CM | POA: Diagnosis not present

## 2011-12-04 DIAGNOSIS — M542 Cervicalgia: Secondary | ICD-10-CM | POA: Diagnosis not present

## 2011-12-04 DIAGNOSIS — H81399 Other peripheral vertigo, unspecified ear: Secondary | ICD-10-CM | POA: Diagnosis not present

## 2011-12-04 DIAGNOSIS — M533 Sacrococcygeal disorders, not elsewhere classified: Secondary | ICD-10-CM | POA: Diagnosis not present

## 2011-12-04 DIAGNOSIS — M545 Low back pain: Secondary | ICD-10-CM | POA: Diagnosis not present

## 2011-12-04 DIAGNOSIS — G608 Other hereditary and idiopathic neuropathies: Secondary | ICD-10-CM | POA: Diagnosis not present

## 2011-12-22 DIAGNOSIS — F329 Major depressive disorder, single episode, unspecified: Secondary | ICD-10-CM | POA: Diagnosis not present

## 2011-12-22 DIAGNOSIS — N319 Neuromuscular dysfunction of bladder, unspecified: Secondary | ICD-10-CM | POA: Diagnosis not present

## 2011-12-22 DIAGNOSIS — Z79899 Other long term (current) drug therapy: Secondary | ICD-10-CM | POA: Diagnosis not present

## 2011-12-22 DIAGNOSIS — L97509 Non-pressure chronic ulcer of other part of unspecified foot with unspecified severity: Secondary | ICD-10-CM | POA: Diagnosis not present

## 2011-12-22 DIAGNOSIS — L8992 Pressure ulcer of unspecified site, stage 2: Secondary | ICD-10-CM | POA: Diagnosis not present

## 2011-12-22 DIAGNOSIS — M810 Age-related osteoporosis without current pathological fracture: Secondary | ICD-10-CM | POA: Diagnosis not present

## 2011-12-22 DIAGNOSIS — M2459 Contracture, other specified joint: Secondary | ICD-10-CM | POA: Diagnosis not present

## 2011-12-22 DIAGNOSIS — G8220 Paraplegia, unspecified: Secondary | ICD-10-CM | POA: Diagnosis not present

## 2011-12-22 DIAGNOSIS — L89609 Pressure ulcer of unspecified heel, unspecified stage: Secondary | ICD-10-CM | POA: Diagnosis not present

## 2012-01-04 ENCOUNTER — Ambulatory Visit: Payer: Medicare Other | Admitting: Family Medicine

## 2012-01-06 DIAGNOSIS — R0989 Other specified symptoms and signs involving the circulatory and respiratory systems: Secondary | ICD-10-CM | POA: Diagnosis not present

## 2012-01-06 DIAGNOSIS — L8992 Pressure ulcer of unspecified site, stage 2: Secondary | ICD-10-CM | POA: Diagnosis not present

## 2012-01-06 DIAGNOSIS — L97409 Non-pressure chronic ulcer of unspecified heel and midfoot with unspecified severity: Secondary | ICD-10-CM | POA: Diagnosis not present

## 2012-01-06 DIAGNOSIS — L8990 Pressure ulcer of unspecified site, unspecified stage: Secondary | ICD-10-CM | POA: Diagnosis not present

## 2012-01-06 DIAGNOSIS — L89609 Pressure ulcer of unspecified heel, unspecified stage: Secondary | ICD-10-CM | POA: Diagnosis not present

## 2012-01-06 DIAGNOSIS — L84 Corns and callosities: Secondary | ICD-10-CM | POA: Diagnosis not present

## 2012-01-06 DIAGNOSIS — I739 Peripheral vascular disease, unspecified: Secondary | ICD-10-CM | POA: Diagnosis not present

## 2012-01-06 DIAGNOSIS — G8220 Paraplegia, unspecified: Secondary | ICD-10-CM | POA: Diagnosis not present

## 2012-01-11 DIAGNOSIS — R42 Dizziness and giddiness: Secondary | ICD-10-CM | POA: Diagnosis not present

## 2012-01-11 DIAGNOSIS — M542 Cervicalgia: Secondary | ICD-10-CM | POA: Diagnosis not present

## 2012-01-11 DIAGNOSIS — G608 Other hereditary and idiopathic neuropathies: Secondary | ICD-10-CM | POA: Diagnosis not present

## 2012-01-11 DIAGNOSIS — H8190 Unspecified disorder of vestibular function, unspecified ear: Secondary | ICD-10-CM | POA: Diagnosis not present

## 2012-01-11 DIAGNOSIS — Z79899 Other long term (current) drug therapy: Secondary | ICD-10-CM | POA: Diagnosis not present

## 2012-01-11 DIAGNOSIS — M545 Low back pain: Secondary | ICD-10-CM | POA: Diagnosis not present

## 2012-01-11 DIAGNOSIS — H81399 Other peripheral vertigo, unspecified ear: Secondary | ICD-10-CM | POA: Diagnosis not present

## 2012-01-11 DIAGNOSIS — M533 Sacrococcygeal disorders, not elsewhere classified: Secondary | ICD-10-CM | POA: Diagnosis not present

## 2012-02-08 DIAGNOSIS — H81399 Other peripheral vertigo, unspecified ear: Secondary | ICD-10-CM | POA: Diagnosis not present

## 2012-02-08 DIAGNOSIS — H8190 Unspecified disorder of vestibular function, unspecified ear: Secondary | ICD-10-CM | POA: Diagnosis not present

## 2012-02-08 DIAGNOSIS — Z79899 Other long term (current) drug therapy: Secondary | ICD-10-CM | POA: Diagnosis not present

## 2012-02-08 DIAGNOSIS — M545 Low back pain: Secondary | ICD-10-CM | POA: Diagnosis not present

## 2012-02-08 DIAGNOSIS — G608 Other hereditary and idiopathic neuropathies: Secondary | ICD-10-CM | POA: Diagnosis not present

## 2012-03-09 DIAGNOSIS — H81399 Other peripheral vertigo, unspecified ear: Secondary | ICD-10-CM | POA: Diagnosis not present

## 2012-03-09 DIAGNOSIS — G608 Other hereditary and idiopathic neuropathies: Secondary | ICD-10-CM | POA: Diagnosis not present

## 2012-03-09 DIAGNOSIS — Z79899 Other long term (current) drug therapy: Secondary | ICD-10-CM | POA: Diagnosis not present

## 2012-03-09 DIAGNOSIS — H8190 Unspecified disorder of vestibular function, unspecified ear: Secondary | ICD-10-CM | POA: Diagnosis not present

## 2012-03-09 DIAGNOSIS — M545 Low back pain: Secondary | ICD-10-CM | POA: Diagnosis not present

## 2012-04-11 DIAGNOSIS — Z79899 Other long term (current) drug therapy: Secondary | ICD-10-CM | POA: Diagnosis not present

## 2012-04-11 DIAGNOSIS — M542 Cervicalgia: Secondary | ICD-10-CM | POA: Diagnosis not present

## 2012-04-11 DIAGNOSIS — G608 Other hereditary and idiopathic neuropathies: Secondary | ICD-10-CM | POA: Diagnosis not present

## 2012-04-11 DIAGNOSIS — H8190 Unspecified disorder of vestibular function, unspecified ear: Secondary | ICD-10-CM | POA: Diagnosis not present

## 2012-04-11 DIAGNOSIS — H81399 Other peripheral vertigo, unspecified ear: Secondary | ICD-10-CM | POA: Diagnosis not present

## 2012-04-11 DIAGNOSIS — M545 Low back pain: Secondary | ICD-10-CM | POA: Diagnosis not present

## 2012-05-16 DIAGNOSIS — G608 Other hereditary and idiopathic neuropathies: Secondary | ICD-10-CM | POA: Diagnosis not present

## 2012-05-16 DIAGNOSIS — Z79899 Other long term (current) drug therapy: Secondary | ICD-10-CM | POA: Diagnosis not present

## 2012-05-16 DIAGNOSIS — H81399 Other peripheral vertigo, unspecified ear: Secondary | ICD-10-CM | POA: Diagnosis not present

## 2012-05-16 DIAGNOSIS — H8190 Unspecified disorder of vestibular function, unspecified ear: Secondary | ICD-10-CM | POA: Diagnosis not present

## 2012-05-16 DIAGNOSIS — M542 Cervicalgia: Secondary | ICD-10-CM | POA: Diagnosis not present

## 2012-05-16 DIAGNOSIS — M545 Low back pain: Secondary | ICD-10-CM | POA: Diagnosis not present

## 2012-06-13 DIAGNOSIS — H81399 Other peripheral vertigo, unspecified ear: Secondary | ICD-10-CM | POA: Diagnosis not present

## 2012-06-13 DIAGNOSIS — G608 Other hereditary and idiopathic neuropathies: Secondary | ICD-10-CM | POA: Diagnosis not present

## 2012-07-25 DIAGNOSIS — M545 Low back pain: Secondary | ICD-10-CM | POA: Diagnosis not present

## 2012-07-25 DIAGNOSIS — H8190 Unspecified disorder of vestibular function, unspecified ear: Secondary | ICD-10-CM | POA: Diagnosis not present

## 2012-07-25 DIAGNOSIS — H81399 Other peripheral vertigo, unspecified ear: Secondary | ICD-10-CM | POA: Diagnosis not present

## 2012-07-25 DIAGNOSIS — G608 Other hereditary and idiopathic neuropathies: Secondary | ICD-10-CM | POA: Diagnosis not present

## 2012-07-25 DIAGNOSIS — Z79899 Other long term (current) drug therapy: Secondary | ICD-10-CM | POA: Diagnosis not present

## 2012-08-22 DIAGNOSIS — H81399 Other peripheral vertigo, unspecified ear: Secondary | ICD-10-CM | POA: Diagnosis not present

## 2012-08-22 DIAGNOSIS — M542 Cervicalgia: Secondary | ICD-10-CM | POA: Diagnosis not present

## 2012-08-22 DIAGNOSIS — H8190 Unspecified disorder of vestibular function, unspecified ear: Secondary | ICD-10-CM | POA: Diagnosis not present

## 2012-08-22 DIAGNOSIS — M545 Low back pain: Secondary | ICD-10-CM | POA: Diagnosis not present

## 2012-08-22 DIAGNOSIS — G608 Other hereditary and idiopathic neuropathies: Secondary | ICD-10-CM | POA: Diagnosis not present

## 2012-08-22 DIAGNOSIS — Z79899 Other long term (current) drug therapy: Secondary | ICD-10-CM | POA: Diagnosis not present

## 2012-09-20 DIAGNOSIS — M545 Low back pain: Secondary | ICD-10-CM | POA: Diagnosis not present

## 2012-09-20 DIAGNOSIS — H8190 Unspecified disorder of vestibular function, unspecified ear: Secondary | ICD-10-CM | POA: Diagnosis not present

## 2012-09-20 DIAGNOSIS — G608 Other hereditary and idiopathic neuropathies: Secondary | ICD-10-CM | POA: Diagnosis not present

## 2012-09-20 DIAGNOSIS — H81399 Other peripheral vertigo, unspecified ear: Secondary | ICD-10-CM | POA: Diagnosis not present

## 2012-10-21 DIAGNOSIS — G608 Other hereditary and idiopathic neuropathies: Secondary | ICD-10-CM | POA: Diagnosis not present

## 2012-10-21 DIAGNOSIS — H8190 Unspecified disorder of vestibular function, unspecified ear: Secondary | ICD-10-CM | POA: Diagnosis not present

## 2012-10-21 DIAGNOSIS — Z79899 Other long term (current) drug therapy: Secondary | ICD-10-CM | POA: Diagnosis not present

## 2012-10-21 DIAGNOSIS — M545 Low back pain: Secondary | ICD-10-CM | POA: Diagnosis not present

## 2012-10-21 DIAGNOSIS — H81399 Other peripheral vertigo, unspecified ear: Secondary | ICD-10-CM | POA: Diagnosis not present

## 2012-11-18 DIAGNOSIS — Z79899 Other long term (current) drug therapy: Secondary | ICD-10-CM | POA: Diagnosis not present

## 2012-11-18 DIAGNOSIS — M545 Low back pain: Secondary | ICD-10-CM | POA: Diagnosis not present

## 2012-11-18 DIAGNOSIS — G608 Other hereditary and idiopathic neuropathies: Secondary | ICD-10-CM | POA: Diagnosis not present

## 2012-11-18 DIAGNOSIS — H8190 Unspecified disorder of vestibular function, unspecified ear: Secondary | ICD-10-CM | POA: Diagnosis not present

## 2012-11-18 DIAGNOSIS — H81399 Other peripheral vertigo, unspecified ear: Secondary | ICD-10-CM | POA: Diagnosis not present

## 2012-12-13 ENCOUNTER — Ambulatory Visit: Payer: Self-pay | Admitting: Family Medicine

## 2012-12-21 DIAGNOSIS — G608 Other hereditary and idiopathic neuropathies: Secondary | ICD-10-CM | POA: Diagnosis not present

## 2012-12-21 DIAGNOSIS — H8190 Unspecified disorder of vestibular function, unspecified ear: Secondary | ICD-10-CM | POA: Diagnosis not present

## 2012-12-21 DIAGNOSIS — H81399 Other peripheral vertigo, unspecified ear: Secondary | ICD-10-CM | POA: Diagnosis not present

## 2012-12-21 DIAGNOSIS — M545 Low back pain: Secondary | ICD-10-CM | POA: Diagnosis not present

## 2013-01-11 ENCOUNTER — Encounter: Payer: Self-pay | Admitting: Family Medicine

## 2013-01-11 ENCOUNTER — Ambulatory Visit (INDEPENDENT_AMBULATORY_CARE_PROVIDER_SITE_OTHER): Payer: Medicare Other | Admitting: Family Medicine

## 2013-01-11 VITALS — BP 101/68 | HR 79 | Temp 96.8°F

## 2013-01-11 DIAGNOSIS — Z0000 Encounter for general adult medical examination without abnormal findings: Secondary | ICD-10-CM | POA: Diagnosis not present

## 2013-01-11 DIAGNOSIS — R259 Unspecified abnormal involuntary movements: Secondary | ICD-10-CM

## 2013-01-11 DIAGNOSIS — M21379 Foot drop, unspecified foot: Secondary | ICD-10-CM

## 2013-01-11 DIAGNOSIS — M810 Age-related osteoporosis without current pathological fracture: Secondary | ICD-10-CM | POA: Diagnosis not present

## 2013-01-11 DIAGNOSIS — E785 Hyperlipidemia, unspecified: Secondary | ICD-10-CM | POA: Diagnosis not present

## 2013-01-11 DIAGNOSIS — G8220 Paraplegia, unspecified: Secondary | ICD-10-CM | POA: Diagnosis not present

## 2013-01-11 DIAGNOSIS — R5381 Other malaise: Secondary | ICD-10-CM | POA: Diagnosis not present

## 2013-01-11 DIAGNOSIS — M216X9 Other acquired deformities of unspecified foot: Secondary | ICD-10-CM

## 2013-01-11 DIAGNOSIS — E559 Vitamin D deficiency, unspecified: Secondary | ICD-10-CM | POA: Diagnosis not present

## 2013-01-11 DIAGNOSIS — R252 Cramp and spasm: Secondary | ICD-10-CM

## 2013-01-11 LAB — POCT CBC
Granulocyte percent: 55.1 %G (ref 37–80)
HCT, POC: 41.5 % (ref 37.7–47.9)
Hemoglobin: 13.9 g/dL (ref 12.2–16.2)
Lymph, poc: 2.3 (ref 0.6–3.4)
MCH, POC: 31.3 pg — AB (ref 27–31.2)
MCHC: 33.6 g/dL (ref 31.8–35.4)
MCV: 93.2 fL (ref 80–97)
MPV: 7.6 fL (ref 0–99.8)
POC Granulocyte: 3.2 (ref 2–6.9)
POC LYMPH PERCENT: 40.2 %L (ref 10–50)
Platelet Count, POC: 219 10*3/uL (ref 142–424)
RBC: 4.5 M/uL (ref 4.04–5.48)
RDW, POC: 13.6 %
WBC: 5.8 10*3/uL (ref 4.6–10.2)

## 2013-01-11 MED ORDER — BACLOFEN 20 MG PO TABS: 20.0000 mg | ORAL_TABLET | Freq: Three times a day (TID) | ORAL | Status: DC
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
## 2013-01-11 NOTE — Patient Instructions (Signed)
Hereditary Spastic Paraplegia Hereditary spastic paraplegia (HSP) is a rare group of inherited disorders. This disorder is also called familial spastic paraparesis (FSP). This disorder can be passed from a parent to child through genes. These disorders are characterized by progressive weakness and stiffness of the legs. SYMPTOMS  The main problem is severe, progressive, lower extremity stiffness. Mild stiffness and problems with gait are typical, early in the disease process and progress to the point that assistive devices such as canes or wheelchairs are needed. Then the disorder can have other neurological problems. These include:  Damage of the optic nerve (optic neuropathy).  Diseases of the retina (retinopathy).  Cataracts.  Seizures.  Dementia.  Lack of muscle control (ataxia).  A skin disorder that causes dry, rough, scaly skin (icthyosis).  Mental retardation.  Problem with the nerves that connect to the brain and spinal cord (peripheral neuropathy).  Deafness. DIAGNOSIS  The diagnosis is mostly made by:  Neurological examination.  Testing to exclude other disorders. Specialized genetic testing and diagnosis are available at some medical centers. TREATMENT  There are no specific treatments to prevent, slow or reverse HSP. Symptomatic treatments used for other forms of longstanding (chronic) paraplegia are sometimes helpful. Regular physical therapy is important for:  Improving muscle strength.  Preserving range of motion. The likely outcome (prognosis) for individuals with HSP varies. Some cases are seriously disabling. Others are less disabling. They are compatible with a productive and full life. The majority of individuals with HSP have a normal life expectancy. It is not possible to predict how quickly or severe symptoms will progress. Once symptoms start, they slowly progress throughout life. In some childhood forms, there is progression that stabilizes once a child  reaches adolescence. HSP rarely results in total loss of mobility of limb. Document Released: 03/13/2002 Document Revised: 06/15/2011 Document Reviewed: 02/21/2009 [name] Patient Information 2014 [place], [place].

## 2013-01-12 LAB — LIPID PANEL
Chol/HDL Ratio: 2.9 ratio units (ref 0.0–4.4)
Cholesterol, Total: 155 mg/dL (ref 100–199)
HDL: 54 mg/dL (ref >39)
LDL Calculated: 90 mg/dL (ref 0–99)
Triglycerides: 56 mg/dL (ref 0–149)
VLDL Cholesterol Cal: 11 mg/dL (ref 5–40)

## 2013-01-12 LAB — CMP14+EGFR
ALT: 12 IU/L (ref 0–32)
AST: 13 IU/L (ref 0–40)
Albumin/Globulin Ratio: 2 (ref 1.1–2.5)
Albumin: 4.1 g/dL (ref 3.5–5.5)
Alkaline Phosphatase: 78 IU/L (ref 39–117)
BUN/Creatinine Ratio: 16 (ref 8–20)
BUN: 10 mg/dL (ref 6–20)
CO2: 31 mmol/L — ABNORMAL HIGH (ref 18–29)
Calcium: 8.9 mg/dL (ref 8.7–10.2)
Chloride: 102 mmol/L (ref 97–108)
Creatinine, Ser: 0.62 mg/dL (ref 0.57–1.00)
GFR calc Af Amer: 135 mL/min/{1.73_m2} (ref >59)
GFR calc non Af Amer: 117 mL/min/{1.73_m2} (ref >59)
Globulin, Total: 2.1 g/dL (ref 1.5–4.5)
Glucose: 77 mg/dL (ref 65–99)
Potassium: 4.7 mmol/L (ref 3.5–5.2)
Sodium: 140 mmol/L (ref 134–144)
Total Bilirubin: 0.3 mg/dL (ref 0.0–1.2)
Total Protein: 6.2 g/dL (ref 6.0–8.5)

## 2013-01-12 LAB — THYROID PANEL WITH TSH
Free Thyroxine Index: 2.2 (ref 1.2–4.9)
T3 Uptake Ratio: 29 % (ref 24–39)
T4, Total: 7.5 ug/dL (ref 4.5–12.0)
TSH: 0.959 u[IU]/mL (ref 0.450–4.500)

## 2013-01-12 LAB — VITAMIN D 25 HYDROXY (VIT D DEFICIENCY, FRACTURES): Vit D, 25-Hydroxy: 14.2 ng/mL — ABNORMAL LOW (ref 30.0–100.0)

## 2013-01-16 ENCOUNTER — Other Ambulatory Visit: Payer: Self-pay | Admitting: Family Medicine

## 2013-01-16 MED ORDER — VITAMIN D (ERGOCALCIFEROL) 1.25 MG (50000 UNIT) PO CAPS: 50000.0000 [IU] | ORAL_CAPSULE | ORAL | Status: DC

## 2013-01-19 ENCOUNTER — Telehealth: Payer: Self-pay | Admitting: Family Medicine

## 2013-01-25 NOTE — Telephone Encounter (Signed)
Documentation on lab result note

## 2013-01-26 ENCOUNTER — Telehealth: Payer: Self-pay | Admitting: Family Medicine

## 2013-02-08 ENCOUNTER — Ambulatory Visit: Payer: Medicaid Other | Admitting: Family Medicine

## 2013-02-22 DIAGNOSIS — Z79899 Other long term (current) drug therapy: Secondary | ICD-10-CM | POA: Diagnosis not present

## 2013-02-22 DIAGNOSIS — M545 Low back pain: Secondary | ICD-10-CM | POA: Diagnosis not present

## 2013-02-24 ENCOUNTER — Encounter: Payer: Self-pay | Admitting: Family Medicine

## 2013-02-24 ENCOUNTER — Ambulatory Visit (INDEPENDENT_AMBULATORY_CARE_PROVIDER_SITE_OTHER): Payer: Medicare Other | Admitting: Family Medicine

## 2013-02-24 VITALS — BP 94/55 | HR 65 | Temp 97.8°F

## 2013-02-24 DIAGNOSIS — J069 Acute upper respiratory infection, unspecified: Secondary | ICD-10-CM | POA: Diagnosis not present

## 2013-02-24 DIAGNOSIS — G894 Chronic pain syndrome: Secondary | ICD-10-CM

## 2013-02-24 MED ORDER — AZITHROMYCIN 250 MG PO TABS: ORAL_TABLET | ORAL | Status: DC

## 2013-02-24 NOTE — Progress Notes (Signed)
  Subjective:    Patient ID: Kim [name], female    DOB: [birth year], 37 y.o.   MRN: [id number]  HPI  This 37 y.o. female presents for evaluation of ear ache and uri sx's.  She States she was dc'd from the subaxone clinic because she was smoking Mj and she popped positive on her urinalysis for MJ.  She is having some URI  Sx's.  Review of Systems    No chest pain, SOB, HA, dizziness, vision change, N/V, diarrhea, constipation, dysuria, urinary urgency or frequency, myalgias, arthralgias or rash.  Objective:   Physical Exam  Vital signs noted  Wheel chair bound female  HEENT - Head atraumatic Normocephalic                Eyes - PERRLA, Conjuctiva - clear Sclera- Clear EOMI                Ears - EAC's Wnl TM's Wnl Gross Hearing. Respiratory - Lungs CTA bilateral Cardiac - RRR S1 and S2 without murmur GI - Abdomen soft Nontender and bowel sounds active x 4 Extremities - No edema. Neuro - Paraplegia      Assessment & Plan:  URI (upper respiratory infection) - Plan: azithromycin (ZITHROMAX) 250 MG tablet  Chronic pain syndrome - Plan: Ambulatory referral to Pain Clinic  Deatra Canter FNP

## 2013-03-07 ENCOUNTER — Telehealth: Payer: Self-pay | Admitting: Family Medicine

## 2013-03-07 DIAGNOSIS — G894 Chronic pain syndrome: Secondary | ICD-10-CM

## 2013-03-13 NOTE — Telephone Encounter (Signed)
Referral set up per [facility]

## 2013-03-15 ENCOUNTER — Ambulatory Visit (INDEPENDENT_AMBULATORY_CARE_PROVIDER_SITE_OTHER): Payer: Medicare Other | Admitting: Pharmacist

## 2013-03-15 ENCOUNTER — Encounter (INDEPENDENT_AMBULATORY_CARE_PROVIDER_SITE_OTHER): Payer: Self-pay

## 2013-03-15 ENCOUNTER — Ambulatory Visit (INDEPENDENT_AMBULATORY_CARE_PROVIDER_SITE_OTHER): Payer: Medicare Other

## 2013-03-15 VITALS — Ht 62.0 in | Wt 110.0 lb

## 2013-03-15 DIAGNOSIS — M810 Age-related osteoporosis without current pathological fracture: Secondary | ICD-10-CM | POA: Diagnosis not present
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
## 2013-03-15 NOTE — Patient Instructions (Signed)
Get over the counter vitamin D 1000IU to 2000IU daily until you can get prescription strength.  Fall Prevention and Home Safety Falls cause injuries and can affect all age groups. It is possible to use preventive measures to significantly decrease the likelihood of falls. There are many simple measures which can make your home safer and prevent falls. OUTDOORS  Repair cracks and edges of walkways and driveways.  Remove high doorway thresholds.  Trim shrubbery on the main path into your home.  Have good outside lighting.  Clear walkways of tools, rocks, debris, and clutter.  Check that handrails are not broken and are securely fastened. Both sides of steps should have handrails.  Have leaves, snow, and ice cleared regularly.  Use sand or salt on walkways during winter months.  In the garage, clean up grease or oil spills. BATHROOM  Install night lights.  Install grab bars by the toilet and in the tub and shower.  Use non-skid mats or decals in the tub or shower.  Place a plastic non-slip stool in the shower to sit on, if needed.  Keep floors dry and clean up all water on the floor immediately.  Remove soap buildup in the tub or shower on a regular basis.  Secure bath mats with non-slip, double-sided rug tape.  Remove throw rugs and tripping hazards from the floors. BEDROOMS  Install night lights.  Make sure a bedside light is easy to reach.  Do not use oversized bedding.  Keep a telephone by your bedside.  Have a firm chair with side arms to use for getting dressed.  Remove throw rugs and tripping hazards from the floor. KITCHEN  Keep handles on pots and pans turned toward the center of the stove. Use back burners when possible.  Clean up spills quickly and allow time for drying.  Avoid walking on wet floors.  Avoid hot utensils and knives.  Position shelves so they are not too high or low.  Place commonly used objects within easy reach.  If  necessary, use a sturdy step stool with a grab bar when reaching.  Keep electrical cables out of the way.  Do not use floor polish or wax that makes floors slippery. If you must use wax, use non-skid floor wax.  Remove throw rugs and tripping hazards from the floor. STAIRWAYS  Never leave objects on stairs.  Place handrails on both sides of stairways and use them. Fix any loose handrails. Make sure handrails on both sides of the stairways are as long as the stairs.  Check carpeting to make sure it is firmly attached along stairs. Make repairs to worn or loose carpet promptly.  Avoid placing throw rugs at the top or bottom of stairways, or properly secure the rug with carpet tape to prevent slippage. Get rid of throw rugs, if possible.  Have an electrician put in a light switch at the top and bottom of the stairs. OTHER FALL PREVENTION TIPS  Wear low-heel or rubber-soled shoes that are supportive and fit well. Wear closed toe shoes.  When using a stepladder, make sure it is fully opened and both spreaders are firmly locked. Do not climb a closed stepladder.  Add color or contrast paint or tape to grab bars and handrails in your home. Place contrasting color strips on first and last steps.  Learn and use mobility aids as needed. Install an electrical emergency response system.  Turn on lights to avoid dark areas. Replace light bulbs that burn out immediately. Get  light switches that glow.  Arrange furniture to create clear pathways. Keep furniture in the same place.  Firmly attach carpet with non-skid or double-sided tape.  Eliminate uneven floor surfaces.  Select a carpet pattern that does not visually hide the edge of steps.  Be aware of all pets. OTHER HOME SAFETY TIPS  Set the water temperature for 120 F (48.8 C).  Keep emergency numbers on or near the telephone.  Keep smoke detectors on every level of the home and near sleeping areas. Document Released: 03/13/2002  Document Revised: 09/22/2011 Document Reviewed: 06/12/2011 [facility] Patient Information 2014 [place], [place].

## 2013-03-28 ENCOUNTER — Encounter: Payer: Self-pay | Admitting: Physical Medicine & Rehabilitation

## 2013-05-10 ENCOUNTER — Encounter: Payer: Medicare Other | Admitting: Physical Medicine & Rehabilitation

## 2013-06-12 ENCOUNTER — Telehealth: Payer: Self-pay | Admitting: Family Medicine

## 2013-06-12 NOTE — Telephone Encounter (Signed)
appt made

## 2013-06-13 ENCOUNTER — Ambulatory Visit: Payer: Medicare Other | Admitting: Nurse Practitioner

## 2013-06-16 ENCOUNTER — Encounter: Payer: Medicare Other | Attending: Physical Medicine & Rehabilitation | Admitting: Physical Medicine & Rehabilitation

## 2013-06-16 ENCOUNTER — Encounter: Payer: Self-pay | Admitting: Physical Medicine & Rehabilitation

## 2013-06-16 VITALS — BP 133/76 | HR 121 | Resp 14 | Ht 63.0 in | Wt 120.0 lb

## 2013-06-16 DIAGNOSIS — S343XXA Injury of cauda equina, initial encounter: Secondary | ICD-10-CM | POA: Insufficient documentation

## 2013-06-16 DIAGNOSIS — M545 Low back pain, unspecified: Secondary | ICD-10-CM | POA: Diagnosis not present

## 2013-06-16 DIAGNOSIS — M25569 Pain in unspecified knee: Secondary | ICD-10-CM | POA: Diagnosis not present

## 2013-06-16 DIAGNOSIS — Z5181 Encounter for therapeutic drug level monitoring: Secondary | ICD-10-CM | POA: Diagnosis not present

## 2013-06-16 DIAGNOSIS — M51379 Other intervertebral disc degeneration, lumbosacral region without mention of lumbar back pain or lower extremity pain: Secondary | ICD-10-CM | POA: Insufficient documentation

## 2013-06-16 DIAGNOSIS — Z79899 Other long term (current) drug therapy: Secondary | ICD-10-CM | POA: Diagnosis not present

## 2013-06-16 DIAGNOSIS — M4716 Other spondylosis with myelopathy, lumbar region: Secondary | ICD-10-CM

## 2013-06-16 DIAGNOSIS — M810 Age-related osteoporosis without current pathological fracture: Secondary | ICD-10-CM

## 2013-06-16 DIAGNOSIS — G834 Cauda equina syndrome: Secondary | ICD-10-CM | POA: Insufficient documentation

## 2013-06-16 DIAGNOSIS — M5137 Other intervertebral disc degeneration, lumbosacral region: Secondary | ICD-10-CM | POA: Insufficient documentation

## 2013-06-16 DIAGNOSIS — M25559 Pain in unspecified hip: Secondary | ICD-10-CM | POA: Diagnosis not present

## 2013-06-16 MED ORDER — PREGABALIN 25 MG PO CAPS: 25.0000 mg | ORAL_CAPSULE | Freq: Three times a day (TID) | ORAL | Status: DC

## 2013-06-16 MED ORDER — NAPROXEN 500 MG PO TBEC: 500.0000 mg | DELAYED_RELEASE_TABLET | Freq: Two times a day (BID) | ORAL | Status: DC
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
## 2013-06-16 NOTE — Patient Instructions (Signed)
PLEASE CALL ME WITH ANY PROBLEMS OR QUESTIONS (#[phone]).      

## 2013-06-20 ENCOUNTER — Telehealth: Payer: Self-pay

## 2013-06-20 NOTE — Telephone Encounter (Signed)
A rep. from [employer] called to ask questions about patient's Lyrica for a prior authorization. Rep stated we will get a decision by fax within 24-48 hours.

## 2013-06-26 ENCOUNTER — Ambulatory Visit (INDEPENDENT_AMBULATORY_CARE_PROVIDER_SITE_OTHER): Payer: Medicare Other | Admitting: Family Medicine

## 2013-06-26 VITALS — BP 122/69 | HR 79 | Temp 97.4°F

## 2013-06-26 DIAGNOSIS — M25569 Pain in unspecified knee: Secondary | ICD-10-CM

## 2013-06-26 DIAGNOSIS — R259 Unspecified abnormal involuntary movements: Secondary | ICD-10-CM | POA: Diagnosis not present

## 2013-06-26 DIAGNOSIS — N318 Other neuromuscular dysfunction of bladder: Secondary | ICD-10-CM

## 2013-06-26 DIAGNOSIS — M4716 Other spondylosis with myelopathy, lumbar region: Secondary | ICD-10-CM

## 2013-06-26 DIAGNOSIS — S343XXA Injury of cauda equina, initial encounter: Secondary | ICD-10-CM

## 2013-06-26 DIAGNOSIS — N3281 Overactive bladder: Secondary | ICD-10-CM

## 2013-06-26 DIAGNOSIS — R51 Headache: Secondary | ICD-10-CM

## 2013-06-26 DIAGNOSIS — M810 Age-related osteoporosis without current pathological fracture: Secondary | ICD-10-CM

## 2013-06-26 DIAGNOSIS — R252 Cramp and spasm: Secondary | ICD-10-CM

## 2013-06-26 MED ORDER — OXYBUTYNIN CHLORIDE ER 10 MG PO TB24: 10.0000 mg | ORAL_TABLET | Freq: Every day | ORAL | Status: DC

## 2013-06-26 MED ORDER — PREDNISONE 5 MG PO TABS: ORAL_TABLET | ORAL | Status: DC

## 2013-06-26 MED ORDER — BACLOFEN 20 MG PO TABS: 20.0000 mg | ORAL_TABLET | Freq: Three times a day (TID) | ORAL | Status: DC

## 2013-06-26 NOTE — Progress Notes (Signed)
   Subjective:    Patient ID: Kim [name], female    DOB: [birth year], 38 y.o.   MRN: [id number]  HPI  This 38 y.o. female presents for evaluation of left knee discomfort.  She denies any injury but has been having some left knee pain for a couple days.  She also has hx of cauda equina syndrome.  She states she needs refills on her medications.  She is seeing Physical medicine.  She is having oab sx's and she is having a headache surrounding her whole head.  Review of Systems C/o left knee pain, headache, OAB No chest pain, SOB, HA, dizziness, vision change, N/V, diarrhea, constipation, dysuria, urinary urgency  myalgias, arthralgias or rash.     Objective:   Physical Exam Vital signs noted  Well developed well nourished female in [facility].  HEENT - Head atraumatic Normocephalic                Eyes - PERRLA, Conjuctiva - clear Sclera- Clear EOMI                Ears - EAC's Wnl TM's Wnl Gross Hearing WNL Respiratory - Lungs CTA bilateral Cardiac - RRR S1 and S2 without murmur GI - Abdomen soft Nontender and bowel sounds active x4 MS - TTP left pre-tibial bursae       Assessment & Plan:  OAB (overactive bladder) - Plan: oxybutynin (DITROPAN XL) 10 MG 24 hr tablet  Spasticity - Plan: baclofen (LIORESAL) 20 MG tablet  Cauda equina spinal cord injury - Follow up with Physical Medicine.  KNEE PAIN - Plan: baclofen (LIORESAL) 20 MG tablet  SPONDYLOSIS WITH MYELOPATHY LUMBAR REGION - Follow up with Physical Medicine.  Headache(784.0) - Plan: predniSONE (DELTASONE) 5 MG tablet  Recommend she get her lyrica refilled by physical medicine since they are prescribing it to her.  Lysbeth Penner FNP

## 2013-07-28 ENCOUNTER — Ambulatory Visit (HOSPITAL_COMMUNITY)
Admission: RE | Admit: 2013-07-28 | Discharge: 2013-07-28 | Disposition: A | Discharge status: Home / Self Care | Payer: Medicare Other | Source: Ambulatory Visit | Attending: Physical Medicine & Rehabilitation | Admitting: Physical Medicine & Rehabilitation

## 2013-07-28 DIAGNOSIS — M545 Low back pain, unspecified: Secondary | ICD-10-CM | POA: Diagnosis not present

## 2013-07-28 DIAGNOSIS — M25569 Pain in unspecified knee: Secondary | ICD-10-CM | POA: Insufficient documentation

## 2013-07-28 DIAGNOSIS — IMO0002 Reserved for concepts with insufficient information to code with codable children: Secondary | ICD-10-CM | POA: Diagnosis not present

## 2013-07-28 DIAGNOSIS — M1710 Unilateral primary osteoarthritis, unspecified knee: Secondary | ICD-10-CM | POA: Diagnosis not present

## 2013-07-28 DIAGNOSIS — M4716 Other spondylosis with myelopathy, lumbar region: Secondary | ICD-10-CM

## 2013-07-31 ENCOUNTER — Telehealth: Payer: Self-pay | Admitting: Physical Medicine & Rehabilitation

## 2013-07-31 NOTE — Telephone Encounter (Signed)
Knee xr shows severe arthritis. Back xrays were unremarkable with coincidental finding of constipation.  Can review findings at next visit. Consider knee injections or other options.

## 2013-08-02 ENCOUNTER — Encounter: Payer: Medicare Other | Attending: Physical Medicine & Rehabilitation | Admitting: Physical Medicine & Rehabilitation

## 2013-08-02 ENCOUNTER — Encounter: Payer: Self-pay | Admitting: Physical Medicine & Rehabilitation

## 2013-08-02 VITALS — BP 131/69 | HR 81 | Resp 14 | Ht 63.0 in | Wt 120.0 lb

## 2013-08-02 DIAGNOSIS — Z5189 Encounter for other specified aftercare: Secondary | ICD-10-CM | POA: Diagnosis not present

## 2013-08-02 DIAGNOSIS — M1710 Unilateral primary osteoarthritis, unspecified knee: Secondary | ICD-10-CM

## 2013-08-02 DIAGNOSIS — M1712 Unilateral primary osteoarthritis, left knee: Secondary | ICD-10-CM | POA: Insufficient documentation

## 2013-08-02 DIAGNOSIS — M25559 Pain in unspecified hip: Secondary | ICD-10-CM | POA: Insufficient documentation

## 2013-08-02 DIAGNOSIS — M948X9 Other specified disorders of cartilage, unspecified sites: Secondary | ICD-10-CM

## 2013-08-02 DIAGNOSIS — M25551 Pain in right hip: Secondary | ICD-10-CM | POA: Insufficient documentation

## 2013-08-02 DIAGNOSIS — S343XXA Injury of cauda equina, initial encounter: Secondary | ICD-10-CM | POA: Diagnosis not present

## 2013-08-02 DIAGNOSIS — IMO0002 Reserved for concepts with insufficient information to code with codable children: Secondary | ICD-10-CM

## 2013-08-02 DIAGNOSIS — M25552 Pain in left hip: Secondary | ICD-10-CM

## 2013-08-02 DIAGNOSIS — M898X9 Other specified disorders of bone, unspecified site: Secondary | ICD-10-CM | POA: Insufficient documentation

## 2013-08-02 DIAGNOSIS — M25569 Pain in unspecified knee: Secondary | ICD-10-CM | POA: Diagnosis not present

## 2013-08-02 MED ORDER — PREGABALIN 50 MG PO CAPS: 50.0000 mg | ORAL_CAPSULE | Freq: Three times a day (TID) | ORAL | Status: DC
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
## 2013-08-02 NOTE — Patient Instructions (Signed)
PLEASE CALL ME WITH ANY PROBLEMS OR QUESTIONS (#[phone]).     I WILL MAKE A REFERRAL TO PT ONCE YOU RECEIVE YOUR ANKLE BRACES

## 2013-10-02 ENCOUNTER — Encounter: Payer: Medicare Other | Admitting: Registered Nurse

## 2013-10-18 ENCOUNTER — Encounter: Payer: Medicare Other | Attending: Physical Medicine & Rehabilitation | Admitting: Registered Nurse

## 2013-10-18 ENCOUNTER — Other Ambulatory Visit: Payer: Self-pay

## 2013-10-18 DIAGNOSIS — M948X9 Other specified disorders of cartilage, unspecified sites: Secondary | ICD-10-CM | POA: Insufficient documentation

## 2013-10-18 DIAGNOSIS — S343XXA Injury of cauda equina, initial encounter: Secondary | ICD-10-CM | POA: Insufficient documentation

## 2013-10-18 DIAGNOSIS — M25569 Pain in unspecified knee: Secondary | ICD-10-CM | POA: Insufficient documentation

## 2013-10-18 DIAGNOSIS — Z5189 Encounter for other specified aftercare: Secondary | ICD-10-CM | POA: Insufficient documentation

## 2013-10-18 DIAGNOSIS — M25559 Pain in unspecified hip: Secondary | ICD-10-CM | POA: Insufficient documentation

## 2013-11-08 ENCOUNTER — Ambulatory Visit: Payer: Medicare Other | Admitting: Registered Nurse

## 2013-12-05 ENCOUNTER — Encounter: Payer: Medicare Other | Attending: Physical Medicine & Rehabilitation | Admitting: Registered Nurse

## 2013-12-05 DIAGNOSIS — Z5189 Encounter for other specified aftercare: Secondary | ICD-10-CM | POA: Insufficient documentation

## 2013-12-05 DIAGNOSIS — M25569 Pain in unspecified knee: Secondary | ICD-10-CM | POA: Insufficient documentation

## 2013-12-05 DIAGNOSIS — S343XXA Injury of cauda equina, initial encounter: Secondary | ICD-10-CM | POA: Insufficient documentation

## 2013-12-05 DIAGNOSIS — M948X9 Other specified disorders of cartilage, unspecified sites: Secondary | ICD-10-CM | POA: Insufficient documentation

## 2013-12-05 DIAGNOSIS — M25559 Pain in unspecified hip: Secondary | ICD-10-CM | POA: Insufficient documentation

## 2014-01-31 ENCOUNTER — Ambulatory Visit (INDEPENDENT_AMBULATORY_CARE_PROVIDER_SITE_OTHER): Payer: Medicare Other

## 2014-01-31 DIAGNOSIS — Z23 Encounter for immunization: Secondary | ICD-10-CM

## 2014-02-05 ENCOUNTER — Encounter: Payer: Self-pay | Admitting: Physical Medicine & Rehabilitation

## 2014-03-05 ENCOUNTER — Telehealth: Payer: Self-pay | Admitting: Nurse Practitioner

## 2014-05-01 ENCOUNTER — Ambulatory Visit (INDEPENDENT_AMBULATORY_CARE_PROVIDER_SITE_OTHER): Payer: Medicare Other | Admitting: Family

## 2014-05-01 ENCOUNTER — Encounter: Payer: Self-pay | Admitting: Family

## 2014-05-01 VITALS — BP 111/76 | HR 75 | Temp 97.0°F | Ht 60.0 in

## 2014-05-01 DIAGNOSIS — M948X9 Other specified disorders of cartilage, unspecified sites: Secondary | ICD-10-CM

## 2014-05-01 DIAGNOSIS — Z418 Encounter for other procedures for purposes other than remedying health state: Secondary | ICD-10-CM | POA: Diagnosis not present

## 2014-05-01 DIAGNOSIS — E559 Vitamin D deficiency, unspecified: Secondary | ICD-10-CM

## 2014-05-01 DIAGNOSIS — Z0000 Encounter for general adult medical examination without abnormal findings: Secondary | ICD-10-CM | POA: Diagnosis not present

## 2014-05-01 DIAGNOSIS — M4716 Other spondylosis with myelopathy, lumbar region: Secondary | ICD-10-CM | POA: Diagnosis not present

## 2014-05-01 DIAGNOSIS — Z23 Encounter for immunization: Secondary | ICD-10-CM

## 2014-05-01 DIAGNOSIS — M898X9 Other specified disorders of bone, unspecified site: Secondary | ICD-10-CM

## 2014-05-01 DIAGNOSIS — Z299 Encounter for prophylactic measures, unspecified: Secondary | ICD-10-CM

## 2014-05-01 DIAGNOSIS — N3946 Mixed incontinence: Secondary | ICD-10-CM | POA: Diagnosis not present

## 2014-05-01 DIAGNOSIS — M179 Osteoarthritis of knee, unspecified: Secondary | ICD-10-CM | POA: Diagnosis not present

## 2014-05-01 DIAGNOSIS — M1712 Unilateral primary osteoarthritis, left knee: Secondary | ICD-10-CM

## 2014-05-01 MED ORDER — MIRABEGRON ER 25 MG PO TB24: 25.0000 mg | ORAL_TABLET | Freq: Every day | ORAL | Status: DC

## 2014-05-01 NOTE — Patient Instructions (Signed)
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
## 2014-05-02 ENCOUNTER — Other Ambulatory Visit: Payer: Self-pay | Admitting: Family

## 2014-05-02 ENCOUNTER — Telehealth: Payer: Self-pay | Admitting: Family

## 2014-05-02 LAB — LIPID PANEL
CHOL/HDL RATIO: 3.4 ratio (ref 0.0–4.4)
Cholesterol, Total: 144 mg/dL (ref 100–199)
HDL: 42 mg/dL (ref >39)
LDL Calculated: 86 mg/dL (ref 0–99)
TRIGLYCERIDES: 78 mg/dL (ref 0–149)
VLDL Cholesterol Cal: 16 mg/dL (ref 5–40)

## 2014-05-02 LAB — CMP14+EGFR
ALT: 7 IU/L (ref 0–32)
AST: 15 IU/L (ref 0–40)
Albumin/Globulin Ratio: 1.7 (ref 1.1–2.5)
Albumin: 3.9 g/dL (ref 3.5–5.5)
Alkaline Phosphatase: 100 IU/L (ref 39–117)
BILIRUBIN TOTAL: 0.3 mg/dL (ref 0.0–1.2)
BUN/Creatinine Ratio: 17 (ref 8–20)
BUN: 11 mg/dL (ref 6–20)
CO2: 26 mmol/L (ref 18–29)
CREATININE: 0.65 mg/dL (ref 0.57–1.00)
Calcium: 8.7 mg/dL (ref 8.7–10.2)
Chloride: 100 mmol/L (ref 97–108)
GFR calc Af Amer: 132 mL/min/{1.73_m2} (ref >59)
GFR calc non Af Amer: 115 mL/min/{1.73_m2} (ref >59)
GLUCOSE: 84 mg/dL (ref 65–99)
Globulin, Total: 2.3 g/dL (ref 1.5–4.5)
Potassium: 5 mmol/L (ref 3.5–5.2)
SODIUM: 139 mmol/L (ref 134–144)
TOTAL PROTEIN: 6.2 g/dL (ref 6.0–8.5)

## 2014-05-02 LAB — VITAMIN D 25 HYDROXY (VIT D DEFICIENCY, FRACTURES): Vit D, 25-Hydroxy: 6.3 ng/mL — ABNORMAL LOW (ref 30.0–100.0)

## 2014-05-02 MED ORDER — VITAMIN D (ERGOCALCIFEROL) 1.25 MG (50000 UNIT) PO CAPS: 50000.0000 [IU] | ORAL_CAPSULE | ORAL | Status: DC

## 2014-05-02 NOTE — Telephone Encounter (Signed)
-----   Message from Sharion Balloon, [place] sent at 05/02/2014  8:33 AM EST ----- Kidney and liver function stable Cholesterol WNL Vit D levels low-Rx sent to pharmacy

## 2014-07-04 ENCOUNTER — Ambulatory Visit: Payer: Medicare Other | Admitting: *Deleted

## 2014-07-13 ENCOUNTER — Ambulatory Visit: Payer: Medicare Other

## 2014-07-20 ENCOUNTER — Ambulatory Visit: Payer: Self-pay

## 2014-08-03 ENCOUNTER — Ambulatory Visit: Payer: Medicare Other

## 2014-08-31 ENCOUNTER — Ambulatory Visit: Payer: Medicare Other

## 2014-10-12 DIAGNOSIS — F331 Major depressive disorder, recurrent, moderate: Secondary | ICD-10-CM | POA: Diagnosis not present

## 2014-11-30 ENCOUNTER — Encounter: Payer: Self-pay | Admitting: Pharmacist

## 2014-11-30 ENCOUNTER — Ambulatory Visit (INDEPENDENT_AMBULATORY_CARE_PROVIDER_SITE_OTHER): Payer: Medicare Other | Admitting: Pharmacist

## 2014-11-30 VITALS — BP 108/70 | HR 74 | Ht 60.0 in | Wt 126.0 lb

## 2014-11-30 DIAGNOSIS — N926 Irregular menstruation, unspecified: Secondary | ICD-10-CM

## 2014-11-30 DIAGNOSIS — Z0000 Encounter for general adult medical examination without abnormal findings: Secondary | ICD-10-CM | POA: Diagnosis not present

## 2014-11-30 DIAGNOSIS — M8580 Other specified disorders of bone density and structure, unspecified site: Secondary | ICD-10-CM

## 2014-11-30 MED ORDER — BENZONATATE 100 MG PO CAPS: 100.0000 mg | ORAL_CAPSULE | Freq: Three times a day (TID) | ORAL | Status: DC | PRN

## 2014-11-30 MED ORDER — MIRABEGRON ER 50 MG PO TB24: 50.0000 mg | ORAL_TABLET | Freq: Every day | ORAL | Status: DC

## 2014-11-30 NOTE — Patient Instructions (Signed)
Kim [name] , Thank you for taking time to come for your Medicare Wellness Visit. I appreciate your ongoing commitment to your health goals. Please review the following plan we discussed and let me know if I can assist you in the future.   We will send referral for Bone Density and for appointment with OB/GYN.    This is a list of the screening recommended for you and due dates:  Health Maintenance  Topic Date Due  . HIV Screening  12/01/1992  . Flu Shot  11/05/2014  . Tetanus Vaccine  05/01/2024    Preventive Care for Adults A healthy lifestyle and preventive care can promote health and wellness. Preventive health guidelines for women include the following key practices.  A routine yearly physical is a good way to check with your health care provider about your health and preventive screening. It is a chance to share any concerns and updates on your health and to receive a thorough exam.  Visit your dentist for a routine exam and preventive care every 6 months. Brush your teeth twice a day and floss once a day. Good oral hygiene prevents tooth decay and gum disease.  The frequency of eye exams is based on your age, health, family medical history, use of contact lenses, and other factors. Follow your health care provider's recommendations for frequency of eye exams.  Eat a healthy diet. Foods like vegetables, fruits, whole grains, low-fat dairy products, and lean protein foods contain the nutrients you need without too many calories. Decrease your intake of foods high in solid fats, added sugars, and salt. Eat the right amount of calories for you.Get information about a proper diet from your health care provider, if necessary.  Regular physical exercise is one of the most important things you can do for your health. Most adults should get at least 150 minutes of moderate-intensity exercise (any activity that increases your heart rate and causes you to sweat) each week. In addition, most  adults need muscle-strengthening exercises on 2 or more days a week.  Maintain a healthy weight. The body mass index (BMI) is a screening tool to identify possible weight problems. It provides an estimate of body fat based on height and weight. Your health care provider can find your BMI and can help you achieve or maintain a healthy weight.For adults 20 years and older:  A BMI below 18.5 is considered underweight.  A BMI of 18.5 to 24.9 is normal.  A BMI of 25 to 29.9 is considered overweight.  A BMI of 30 and above is considered obese.  Maintain normal blood lipids and cholesterol levels by exercising and minimizing your intake of saturated fat. Eat a balanced diet with plenty of fruit and vegetables. Blood tests for lipids and cholesterol should begin at age 47 and be repeated every 5 years. If your lipid or cholesterol levels are high, you are over 50, or you are at high risk for heart disease, you may need your cholesterol levels checked more frequently.Ongoing high lipid and cholesterol levels should be treated with medicines if diet and exercise are not working.  If you smoke, find out from your health care provider how to quit. If you do not use tobacco, do not start.  Lung cancer screening is recommended for adults aged 28-80 years who are at high risk for developing lung cancer because of a history of smoking. A yearly low-dose CT scan of the lungs is recommended for people who have at least a 30-pack-year  history of smoking and are a current smoker or have quit within the past 15 years. A pack year of smoking is smoking an average of 1 pack of cigarettes a day for 1 year (for example: 1 pack a day for 30 years or 2 packs a day for 15 years). Yearly screening should continue until the smoker has stopped smoking for at least 15 years. Yearly screening should be stopped for people who develop a health problem that would prevent them from having lung cancer treatment.  If you are pregnant,  do not drink alcohol. If you are breastfeeding, be very cautious about drinking alcohol. If you are not pregnant and choose to drink alcohol, do not have more than 1 drink per day. One drink is considered to be 12 ounces (355 mL) of beer, 5 ounces (148 mL) of wine, or 1.5 ounces (44 mL) of liquor.  Avoid use of street drugs. Do not share needles with anyone. Ask for help if you need support or instructions about stopping the use of drugs.  High blood pressure causes heart disease and increases the risk of stroke. Your blood pressure should be checked at least every 1 to 2 years. Ongoing high blood pressure should be treated with medicines if weight loss and exercise do not work.  If you are 74-66 years old, ask your health care provider if you should take aspirin to prevent strokes.  Diabetes screening involves taking a blood sample to check your fasting blood sugar level. This should be done once every 3 years, after age 24, if you are within normal weight and without risk factors for diabetes. Testing should be considered at a younger age or be carried out more frequently if you are overweight and have at least 1 risk factor for diabetes.  Breast cancer screening is essential preventive care for women. You should practice "breast self-awareness." This means understanding the normal appearance and feel of your breasts and may include breast self-examination. Any changes detected, no matter how small, should be reported to a health care provider. Women in their 52s and 30s should have a clinical breast exam (CBE) by a health care provider as part of a regular health exam every 1 to 3 years. After age 13, women should have a CBE every year. Starting at age 78, women should consider having a mammogram (breast X-ray test) every year. Women who have a family history of breast cancer should talk to their health care provider about genetic screening. Women at a high risk of breast cancer should talk to their  health care providers about having an MRI and a mammogram every year.  Breast cancer gene (BRCA)-related cancer risk assessment is recommended for women who have family members with BRCA-related cancers. BRCA-related cancers include breast, ovarian, tubal, and peritoneal cancers. Having family members with these cancers may be associated with an increased risk for harmful changes (mutations) in the breast cancer genes BRCA1 and BRCA2. Results of the assessment will determine the need for genetic counseling and BRCA1 and BRCA2 testing.  Routine pelvic exams to screen for cancer are no longer recommended for nonpregnant women who are considered low risk for cancer of the pelvic organs (ovaries, uterus, and vagina) and who do not have symptoms. Ask your health care provider if a screening pelvic exam is right for you.  If you have had past treatment for cervical cancer or a condition that could lead to cancer, you need Pap tests and screening for cancer for at least  20 years after your treatment. If Pap tests have been discontinued, your risk factors (such as having a new sexual partner) need to be reassessed to determine if screening should be resumed. Some women have medical problems that increase the chance of getting cervical cancer. In these cases, your health care provider may recommend more frequent screening and Pap tests.  The HPV test is an additional test that may be used for cervical cancer screening. The HPV test looks for the virus that can cause the cell changes on the cervix. The cells collected during the Pap test can be tested for HPV. The HPV test could be used to screen women aged 49 years and older, and should be used in women of any age who have unclear Pap test results. After the age of 69, women should have HPV testing at the same frequency as a Pap test.  Colorectal cancer can be detected and often prevented. Most routine colorectal cancer screening begins at the age of 63 years and  continues through age 45 years. However, your health care provider may recommend screening at an earlier age if you have risk factors for colon cancer. On a yearly basis, your health care provider may provide home test kits to check for hidden blood in the stool. Use of a small camera at the end of a tube, to directly examine the colon (sigmoidoscopy or colonoscopy), can detect the earliest forms of colorectal cancer. Talk to your health care provider about this at age 84, when routine screening begins. Direct exam of the colon should be repeated every 5-10 years through age 20 years, unless early forms of pre-cancerous polyps or small growths are found.  People who are at an increased risk for hepatitis B should be screened for this virus. You are considered at high risk for hepatitis B if:  You were born in a country where hepatitis B occurs often. Talk with your health care provider about which countries are considered high risk.  Your parents were born in a high-risk country and you have not received a shot to protect against hepatitis B (hepatitis B vaccine).  You have HIV or AIDS.  You use needles to inject street drugs.  You live with, or have sex with, someone who has hepatitis B.  You get hemodialysis treatment.  You take certain medicines for conditions like cancer, organ transplantation, and autoimmune conditions.  Hepatitis C blood testing is recommended for all people born from [birth year] through 1965 and any individual with known risks for hepatitis C.  Practice safe sex. Use condoms and avoid high-risk sexual practices to reduce the spread of sexually transmitted infections (STIs). STIs include gonorrhea, chlamydia, syphilis, trichomonas, herpes, HPV, and human immunodeficiency virus (HIV). Herpes, HIV, and HPV are viral illnesses that have no cure. They can result in disability, cancer, and death.  You should be screened for sexually transmitted illnesses (STIs) including gonorrhea  and chlamydia if:  You are sexually active and are younger than 24 years.  You are older than 24 years and your health care provider tells you that you are at risk for this type of infection.  Your sexual activity has changed since you were last screened and you are at an increased risk for chlamydia or gonorrhea. Ask your health care provider if you are at risk.  If you are at risk of being infected with HIV, it is recommended that you take a prescription medicine daily to prevent HIV infection. This is called preexposure prophylaxis (PrEP).  You are considered at risk if:  You are a heterosexual woman, are sexually active, and are at increased risk for HIV infection.  You take drugs by injection.  You are sexually active with a partner who has HIV.  Talk with your health care provider about whether you are at high risk of being infected with HIV. If you choose to begin PrEP, you should first be tested for HIV. You should then be tested every 3 months for as long as you are taking PrEP.  Osteoporosis is a disease in which the bones lose minerals and strength with aging. This can result in serious bone fractures or breaks. The risk of osteoporosis can be identified using a bone density scan. Women ages 43 years and over and women at risk for fractures or osteoporosis should discuss screening with their health care providers. Ask your health care provider whether you should take a calcium supplement or vitamin D to reduce the rate of osteoporosis.  Menopause can be associated with physical symptoms and risks. Hormone replacement therapy is available to decrease symptoms and risks. You should talk to your health care provider about whether hormone replacement therapy is right for you.  Use sunscreen. Apply sunscreen liberally and repeatedly throughout the day. You should seek shade when your shadow is shorter than you. Protect yourself by wearing long sleeves, pants, a wide-brimmed hat, and  sunglasses year round, whenever you are outdoors.  Once a month, do a whole body skin exam, using a mirror to look at the skin on your back. Tell your health care provider of new moles, moles that have irregular borders, moles that are larger than a pencil eraser, or moles that have changed in shape or color.  Stay current with required vaccines (immunizations).  Influenza vaccine. All adults should be immunized every year.  Tetanus, diphtheria, and acellular pertussis (Td, Tdap) vaccine. Pregnant women should receive 1 dose of Tdap vaccine during each pregnancy. The dose should be obtained regardless of the length of time since the last dose. Immunization is preferred during the 27th-36th week of gestation. An adult who has not previously received Tdap or who does not know her vaccine status should receive 1 dose of Tdap. This initial dose should be followed by tetanus and diphtheria toxoids (Td) booster doses every 10 years. Adults with an unknown or incomplete history of completing a 3-dose immunization series with Td-containing vaccines should begin or complete a primary immunization series including a Tdap dose. Adults should receive a Td booster every 10 years.  Varicella vaccine. An adult without evidence of immunity to varicella should receive 2 doses or a second dose if she has previously received 1 dose. Pregnant females who do not have evidence of immunity should receive the first dose after pregnancy. This first dose should be obtained before leaving the health care facility. The second dose should be obtained 4-8 weeks after the first dose.  Human papillomavirus (HPV) vaccine. Females aged 13-26 years who have not received the vaccine previously should obtain the 3-dose series. The vaccine is not recommended for use in pregnant females. However, pregnancy testing is not needed before receiving a dose. If a female is found to be pregnant after receiving a dose, no treatment is needed. In that  case, the remaining doses should be delayed until after the pregnancy. Immunization is recommended for any person with an immunocompromised condition through the age of 30 years if she did not get any or all doses earlier. During the 3-dose  series, the second dose should be obtained 4-8 weeks after the first dose. The third dose should be obtained 24 weeks after the first dose and 16 weeks after the second dose.  Zoster vaccine. One dose is recommended for adults aged 58 years or older unless certain conditions are present.  Measles, mumps, and rubella (MMR) vaccine. Adults born before [birth year] generally are considered immune to measles and mumps. Adults born in [birth year] or later should have 1 or more doses of MMR vaccine unless there is a contraindication to the vaccine or there is laboratory evidence of immunity to each of the three diseases. A routine second dose of MMR vaccine should be obtained at least 28 days after the first dose for students attending postsecondary schools, health care workers, or international travelers. People who received inactivated measles vaccine or an unknown type of measles vaccine during 1963-1967 should receive 2 doses of MMR vaccine. People who received inactivated mumps vaccine or an unknown type of mumps vaccine before 1979 and are at high risk for mumps infection should consider immunization with 2 doses of MMR vaccine. For females of childbearing age, rubella immunity should be determined. If there is no evidence of immunity, females who are not pregnant should be vaccinated. If there is no evidence of immunity, females who are pregnant should delay immunization until after pregnancy. Unvaccinated health care workers born before [birth year] who lack laboratory evidence of measles, mumps, or rubella immunity or laboratory confirmation of disease should consider measles and mumps immunization with 2 doses of MMR vaccine or rubella immunization with 1 dose of MMR vaccine.  Pneumococcal  13-valent conjugate (PCV13) vaccine. When indicated, a person who is uncertain of her immunization history and has no record of immunization should receive the PCV13 vaccine. An adult aged 47 years or older who has certain medical conditions and has not been previously immunized should receive 1 dose of PCV13 vaccine. This PCV13 should be followed with a dose of pneumococcal polysaccharide (PPSV23) vaccine. The PPSV23 vaccine dose should be obtained at least 8 weeks after the dose of PCV13 vaccine. An adult aged 74 years or older who has certain medical conditions and previously received 1 or more doses of PPSV23 vaccine should receive 1 dose of PCV13. The PCV13 vaccine dose should be obtained 1 or more years after the last PPSV23 vaccine dose.  Pneumococcal polysaccharide (PPSV23) vaccine. When PCV13 is also indicated, PCV13 should be obtained first. All adults aged 33 years and older should be immunized. An adult younger than age 53 years who has certain medical conditions should be immunized. Any person who resides in a nursing home or long-term care facility should be immunized. An adult smoker should be immunized. People with an immunocompromised condition and certain other conditions should receive both PCV13 and PPSV23 vaccines. People with human immunodeficiency virus (HIV) infection should be immunized as soon as possible after diagnosis. Immunization during chemotherapy or radiation therapy should be avoided. Routine use of PPSV23 vaccine is not recommended for American Indians, [place]atives, or people younger than 65 years unless there are medical conditions that require PPSV23 vaccine. When indicated, people who have unknown immunization and have no record of immunization should receive PPSV23 vaccine. One-time revaccination 5 years after the first dose of PPSV23 is recommended for people aged 19-64 years who have chronic kidney failure, nephrotic syndrome, asplenia, or immunocompromised conditions.  People who received 1-2 doses of PPSV23 before age 75 years should receive another dose of PPSV23 vaccine at age 57  years or later if at least 5 years have passed since the previous dose. Doses of PPSV23 are not needed for people immunized with PPSV23 at or after age 56 years.  Meningococcal vaccine. Adults with asplenia or persistent complement component deficiencies should receive 2 doses of quadrivalent meningococcal conjugate (MenACWY-D) vaccine. The doses should be obtained at least 2 months apart. Microbiologists working with certain meningococcal bacteria, [place] recruits, people at risk during an outbreak, and people who travel to or live in countries with a high rate of meningitis should be immunized. A first-year college student up through age 50 years who is living in a residence hall should receive a dose if she did not receive a dose on or after her 16th birthday. Adults who have certain high-risk conditions should receive one or more doses of vaccine.  Hepatitis A vaccine. Adults who wish to be protected from this disease, have certain high-risk conditions, work with hepatitis A-infected animals, work in hepatitis A research labs, or travel to or work in countries with a high rate of hepatitis A should be immunized. Adults who were previously unvaccinated and who anticipate close contact with an international adoptee during the first 60 days after arrival in the [place] from a country with a high rate of hepatitis A should be immunized.  Hepatitis B vaccine. Adults who wish to be protected from this disease, have certain high-risk conditions, may be exposed to blood or other infectious body fluids, are household contacts or sex partners of hepatitis B positive people, are clients or workers in certain care facilities, or travel to or work in countries with a high rate of hepatitis B should be immunized.  Haemophilus influenzae type b (Hib) vaccine. A previously unvaccinated person with  asplenia or sickle cell disease or having a scheduled splenectomy should receive 1 dose of Hib vaccine. Regardless of previous immunization, a recipient of a hematopoietic stem cell transplant should receive a 3-dose series 6-12 months after her successful transplant. Hib vaccine is not recommended for adults with HIV infection. Preventive Services / Frequency Ages 18 to 11 years  Blood pressure check.** / Every 1 to 2 years.  Lipid and cholesterol check.** / Every 5 years beginning at age 70.  Clinical breast exam.** / Every 3 years for women in their 13s and 62s.  BRCA-related cancer risk assessment.** / For women who have family members with a BRCA-related cancer (breast, ovarian, tubal, or peritoneal cancers).  Pap test.** / Every 2 years from ages 68 through 73. Every 3 years starting at age 67 through age 1 or 21 with a history of 3 consecutive normal Pap tests.  HPV screening.** / Every 3 years from ages 48 through ages 29 to 48 with a history of 3 consecutive normal Pap tests.  Hepatitis C blood test.** / For any individual with known risks for hepatitis C.  Skin self-exam. / Monthly.  Influenza vaccine. / Every year.  Tetanus, diphtheria, and acellular pertussis (Tdap, Td) vaccine.** / Consult your health care provider. Pregnant women should receive 1 dose of Tdap vaccine during each pregnancy. 1 dose of Td every 10 years.  Varicella vaccine.** / Consult your health care provider. Pregnant females who do not have evidence of immunity should receive the first dose after pregnancy.  HPV vaccine. / 3 doses over 6 months, if 65 and younger. The vaccine is not recommended for use in pregnant females. However, pregnancy testing is not needed before receiving a dose.  Measles, mumps, rubella (MMR) vaccine.** /  You need at least 1 dose of MMR if you were born in 1957 or later. You may also need a 2nd dose. For females of childbearing age, rubella immunity should be determined. If there  is no evidence of immunity, females who are not pregnant should be vaccinated. If there is no evidence of immunity, females who are pregnant should delay immunization until after pregnancy.  Pneumococcal 13-valent conjugate (PCV13) vaccine.** / Consult your health care provider.  Pneumococcal polysaccharide (PPSV23) vaccine.** / 1 to 2 doses if you smoke cigarettes or if you have certain conditions.  Meningococcal vaccine.** / 1 dose if you are age 77 to 59 years and a Market researcher living in a residence hall, or have one of several medical conditions, you need to get vaccinated against meningococcal disease. You may also need additional booster doses.  Hepatitis A vaccine.** / Consult your health care provider.  Hepatitis B vaccine.** / Consult your health care provider.  Haemophilus influenzae type b (Hib) vaccine.** / Consult your health care provider. Ages 30 to 75 years  Blood pressure check.** / Every 1 to 2 years.  Lipid and cholesterol check.** / Every 5 years beginning at age 48 years.  Lung cancer screening. / Every year if you are aged 72-80 years and have a 30-pack-year history of smoking and currently smoke or have quit within the past 15 years. Yearly screening is stopped once you have quit smoking for at least 15 years or develop a health problem that would prevent you from having lung cancer treatment.  Clinical breast exam.** / Every year after age 64 years.  BRCA-related cancer risk assessment.** / For women who have family members with a BRCA-related cancer (breast, ovarian, tubal, or peritoneal cancers).  Mammogram.** / Every year beginning at age 18 years and continuing for as long as you are in good health. Consult with your health care provider.  Pap test.** / Every 3 years starting at age 29 years through age 92 or 75 years with a history of 3 consecutive normal Pap tests.  HPV screening.** / Every 3 years from ages 41 years through ages 23 to 67  years with a history of 3 consecutive normal Pap tests.  Fecal occult blood test (FOBT) of stool. / Every year beginning at age 64 years and continuing until age 5 years. You may not need to do this test if you get a colonoscopy every 10 years.  Flexible sigmoidoscopy or colonoscopy.** / Every 5 years for a flexible sigmoidoscopy or every 10 years for a colonoscopy beginning at age 33 years and continuing until age 66 years.  Hepatitis C blood test.** / For all people born from [birth year] through 1963 and any individual with known risks for hepatitis C.  Skin self-exam. / Monthly.  Influenza vaccine. / Every year.  Tetanus, diphtheria, and acellular pertussis (Tdap/Td) vaccine.** / Consult your health care provider. Pregnant women should receive 1 dose of Tdap vaccine during each pregnancy. 1 dose of Td every 10 years.  Varicella vaccine.** / Consult your health care provider. Pregnant females who do not have evidence of immunity should receive the first dose after pregnancy.  Zoster vaccine.** / 1 dose for adults aged 88 years or older.  Measles, mumps, rubella (MMR) vaccine.** / You need at least 1 dose of MMR if you were born in 1957 or later. You may also need a 2nd dose. For females of childbearing age, rubella immunity should be determined. If there is no evidence of immunity,  females who are not pregnant should be vaccinated. If there is no evidence of immunity, females who are pregnant should delay immunization until after pregnancy.  Pneumococcal 13-valent conjugate (PCV13) vaccine.** / Consult your health care provider.  Pneumococcal polysaccharide (PPSV23) vaccine.** / 1 to 2 doses if you smoke cigarettes or if you have certain conditions.  Meningococcal vaccine.** / Consult your health care provider.  Hepatitis A vaccine.** / Consult your health care provider.  Hepatitis B vaccine.** / Consult your health care provider.  Haemophilus influenzae type b (Hib) vaccine.** / Consult  your health care provider. Ages 45 years and over  Blood pressure check.** / Every 1 to 2 years.  Lipid and cholesterol check.** / Every 5 years beginning at age 36 years.  Lung cancer screening. / Every year if you are aged 75-80 years and have a 30-pack-year history of smoking and currently smoke or have quit within the past 15 years. Yearly screening is stopped once you have quit smoking for at least 15 years or develop a health problem that would prevent you from having lung cancer treatment.  Clinical breast exam.** / Every year after age 41 years.  BRCA-related cancer risk assessment.** / For women who have family members with a BRCA-related cancer (breast, ovarian, tubal, or peritoneal cancers).  Mammogram.** / Every year beginning at age 85 years and continuing for as long as you are in good health. Consult with your health care provider.  Pap test.** / Every 3 years starting at age 75 years through age 108 or 66 years with 3 consecutive normal Pap tests. Testing can be stopped between 65 and 70 years with 3 consecutive normal Pap tests and no abnormal Pap or HPV tests in the past 10 years.  HPV screening.** / Every 3 years from ages 73 years through ages 32 or 63 years with a history of 3 consecutive normal Pap tests. Testing can be stopped between 65 and 70 years with 3 consecutive normal Pap tests and no abnormal Pap or HPV tests in the past 10 years.  Fecal occult blood test (FOBT) of stool. / Every year beginning at age 68 years and continuing until age 64 years. You may not need to do this test if you get a colonoscopy every 10 years.  Flexible sigmoidoscopy or colonoscopy.** / Every 5 years for a flexible sigmoidoscopy or every 10 years for a colonoscopy beginning at age 20 years and continuing until age 37 years.  Hepatitis C blood test.** / For all people born from [birth year] through 1963 and any individual with known risks for hepatitis C.  Osteoporosis screening.** / A one-time  screening for women ages 69 years and over and women at risk for fractures or osteoporosis.  Skin self-exam. / Monthly.  Influenza vaccine. / Every year.  Tetanus, diphtheria, and acellular pertussis (Tdap/Td) vaccine.** / 1 dose of Td every 10 years.  Varicella vaccine.** / Consult your health care provider.  Zoster vaccine.** / 1 dose for adults aged 85 years or older.  Pneumococcal 13-valent conjugate (PCV13) vaccine.** / Consult your health care provider.  Pneumococcal polysaccharide (PPSV23) vaccine.** / 1 dose for all adults aged 25 years and older.  Meningococcal vaccine.** / Consult your health care provider.  Hepatitis A vaccine.** / Consult your health care provider.  Hepatitis B vaccine.** / Consult your health care provider.  Haemophilus influenzae type b (Hib) vaccine.** / Consult your health care provider. ** Family history and personal history of risk and conditions may change your health  care provider's recommendations. Document Released: 05/19/2001 Document Revised: 08/07/2013 Document Reviewed: 08/18/2010 [facility] Patient Information 2015 [place], [place]. This information is not intended to replace advice given to you by your health care provider. Make sure you discuss any questions you have with your health care provider.
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
## 2014-11-30 NOTE — Addendum Note (Signed)
Addended by: Cherre Robins on: 11/30/2014 12:23 PM   Modules accepted: Orders, Medications

## 2014-12-12 DIAGNOSIS — M25561 Pain in right knee: Secondary | ICD-10-CM | POA: Diagnosis not present

## 2015-01-07 ENCOUNTER — Ambulatory Visit: Payer: Self-pay | Admitting: Family

## 2015-01-08 ENCOUNTER — Encounter: Payer: Self-pay | Admitting: Family

## 2015-01-16 ENCOUNTER — Encounter: Payer: Medicare Other | Admitting: Obstetrics and Gynecology

## 2015-01-22 DIAGNOSIS — M25562 Pain in left knee: Secondary | ICD-10-CM | POA: Diagnosis not present

## 2015-01-22 DIAGNOSIS — G8220 Paraplegia, unspecified: Secondary | ICD-10-CM | POA: Diagnosis not present

## 2015-01-22 DIAGNOSIS — M25561 Pain in right knee: Secondary | ICD-10-CM | POA: Diagnosis not present

## 2015-01-22 DIAGNOSIS — M6281 Muscle weakness (generalized): Secondary | ICD-10-CM | POA: Diagnosis not present

## 2015-01-24 ENCOUNTER — Encounter: Payer: Medicare Other | Admitting: Obstetrics and Gynecology

## 2015-01-30 DIAGNOSIS — M25561 Pain in right knee: Secondary | ICD-10-CM | POA: Diagnosis not present

## 2015-01-30 DIAGNOSIS — M25562 Pain in left knee: Secondary | ICD-10-CM | POA: Diagnosis not present

## 2015-01-30 DIAGNOSIS — G8220 Paraplegia, unspecified: Secondary | ICD-10-CM | POA: Diagnosis not present

## 2015-01-30 DIAGNOSIS — M6281 Muscle weakness (generalized): Secondary | ICD-10-CM | POA: Diagnosis not present

## 2015-02-06 DIAGNOSIS — M6281 Muscle weakness (generalized): Secondary | ICD-10-CM | POA: Diagnosis not present

## 2015-02-06 DIAGNOSIS — G8220 Paraplegia, unspecified: Secondary | ICD-10-CM | POA: Diagnosis not present

## 2015-02-06 DIAGNOSIS — M25561 Pain in right knee: Secondary | ICD-10-CM | POA: Diagnosis not present

## 2015-02-06 DIAGNOSIS — M25562 Pain in left knee: Secondary | ICD-10-CM | POA: Diagnosis not present

## 2015-02-11 ENCOUNTER — Ambulatory Visit (INDEPENDENT_AMBULATORY_CARE_PROVIDER_SITE_OTHER): Payer: Medicare Other | Admitting: Family

## 2015-02-11 ENCOUNTER — Encounter: Payer: Self-pay | Admitting: Family

## 2015-02-11 VITALS — BP 122/84 | HR 90 | Temp 97.3°F

## 2015-02-11 DIAGNOSIS — M25552 Pain in left hip: Secondary | ICD-10-CM

## 2015-02-11 DIAGNOSIS — M179 Osteoarthritis of knee, unspecified: Secondary | ICD-10-CM | POA: Diagnosis not present

## 2015-02-11 DIAGNOSIS — M25561 Pain in right knee: Secondary | ICD-10-CM

## 2015-02-11 DIAGNOSIS — S343XXS Injury of cauda equina, sequela: Secondary | ICD-10-CM

## 2015-02-11 DIAGNOSIS — M25551 Pain in right hip: Secondary | ICD-10-CM

## 2015-02-11 DIAGNOSIS — M1712 Unilateral primary osteoarthritis, left knee: Secondary | ICD-10-CM

## 2015-02-11 DIAGNOSIS — M25562 Pain in left knee: Secondary | ICD-10-CM
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
## 2015-02-11 NOTE — Patient Instructions (Signed)
Health Maintenance, Female Adopting a healthy lifestyle and getting preventive care can go a long way to promote health and wellness. Talk with your health care provider about what schedule of regular examinations is right for you. This is a good chance for you to check in with your provider about disease prevention and staying healthy. In between checkups, there are plenty of things you can do on your own. Experts have done a lot of research about which lifestyle changes and preventive measures are most likely to keep you healthy. Ask your health care provider for more information. WEIGHT AND DIET  Eat a healthy diet  Be sure to include plenty of vegetables, fruits, low-fat dairy products, and lean protein.  Do not eat a lot of foods high in solid fats, added sugars, or salt.  Get regular exercise. This is one of the most important things you can do for your health.  Most adults should exercise for at least 150 minutes each week. The exercise should increase your heart rate and make you sweat (moderate-intensity exercise).  Most adults should also do strengthening exercises at least twice a week. This is in addition to the moderate-intensity exercise.  Maintain a healthy weight  Body mass index (BMI) is a measurement that can be used to identify possible weight problems. It estimates body fat based on height and weight. Your health care provider can help determine your BMI and help you achieve or maintain a healthy weight.  For females 20 years of age and older:   A BMI below 18.5 is considered underweight.  A BMI of 18.5 to 24.9 is normal.  A BMI of 25 to 29.9 is considered overweight.  A BMI of 30 and above is considered obese.  Watch levels of cholesterol and blood lipids  You should start having your blood tested for lipids and cholesterol at 37 years of age, then have this test every 5 years.  You may need to have your cholesterol levels checked more often if:  Your lipid  or cholesterol levels are high.  You are older than 37 years of age.  You are at high risk for heart disease.  CANCER SCREENING   Lung Cancer  Lung cancer screening is recommended for adults 55-80 years old who are at high risk for lung cancer because of a history of smoking.  A yearly low-dose CT scan of the lungs is recommended for people who:  Currently smoke.  Have quit within the past 15 years.  Have at least a 30-pack-year history of smoking. A pack year is smoking an average of one pack of cigarettes a day for 1 year.  Yearly screening should continue until it has been 15 years since you quit.  Yearly screening should stop if you develop a health problem that would prevent you from having lung cancer treatment.  Breast Cancer  Practice breast self-awareness. This means understanding how your breasts normally appear and feel.  It also means doing regular breast self-exams. Let your health care provider know about any changes, no matter how small.  If you are in your 20s or 30s, you should have a clinical breast exam (CBE) by a health care provider every 1-3 years as part of a regular health exam.  If you are 40 or older, have a CBE every year. Also consider having a breast X-ray (mammogram) every year.  If you have a family history of breast cancer, talk to your health care provider about genetic screening.  If you   are at high risk for breast cancer, talk to your health care provider about having an MRI and a mammogram every year.  Breast cancer gene (BRCA) assessment is recommended for women who have family members with BRCA-related cancers. BRCA-related cancers include:  Breast.  Ovarian.  Tubal.  Peritoneal cancers.  Results of the assessment will determine the need for genetic counseling and BRCA1 and BRCA2 testing. Cervical Cancer Your health care provider may recommend that you be screened regularly for cancer of the pelvic organs (ovaries, uterus, and  vagina). This screening involves a pelvic examination, including checking for microscopic changes to the surface of your cervix (Pap test). You may be encouraged to have this screening done every 3 years, beginning at age 21.  For women ages 30-65, health care providers may recommend pelvic exams and Pap testing every 3 years, or they may recommend the Pap and pelvic exam, combined with testing for human papilloma virus (HPV), every 5 years. Some types of HPV increase your risk of cervical cancer. Testing for HPV may also be done on women of any age with unclear Pap test results.  Other health care providers may not recommend any screening for nonpregnant women who are considered low risk for pelvic cancer and who do not have symptoms. Ask your health care provider if a screening pelvic exam is right for you.  If you have had past treatment for cervical cancer or a condition that could lead to cancer, you need Pap tests and screening for cancer for at least 20 years after your treatment. If Pap tests have been discontinued, your risk factors (such as having a new sexual partner) need to be reassessed to determine if screening should resume. Some women have medical problems that increase the chance of getting cervical cancer. In these cases, your health care provider may recommend more frequent screening and Pap tests. Colorectal Cancer  This type of cancer can be detected and often prevented.  Routine colorectal cancer screening usually begins at 37 years of age and continues through 37 years of age.  Your health care provider may recommend screening at an earlier age if you have risk factors for colon cancer.  Your health care provider may also recommend using home test kits to check for hidden blood in the stool.  A small camera at the end of a tube can be used to examine your colon directly (sigmoidoscopy or colonoscopy). This is done to check for the earliest forms of colorectal  cancer.  Routine screening usually begins at age 50.  Direct examination of the colon should be repeated every 5-10 years through 37 years of age. However, you may need to be screened more often if early forms of precancerous polyps or small growths are found. Skin Cancer  Check your skin from head to toe regularly.  Tell your health care provider about any new moles or changes in moles, especially if there is a change in a mole's shape or color.  Also tell your health care provider if you have a mole that is larger than the size of a pencil eraser.  Always use sunscreen. Apply sunscreen liberally and repeatedly throughout the day.  Protect yourself by wearing long sleeves, pants, a wide-brimmed hat, and sunglasses whenever you are outside. HEART DISEASE, DIABETES, AND HIGH BLOOD PRESSURE   High blood pressure causes heart disease and increases the risk of stroke. High blood pressure is more likely to develop in:  People who have blood pressure in the high end   of the normal range (130-139/85-89 mm Hg).  People who are overweight or obese.  People who are African American.  If you are 38-23 years of age, have your blood pressure checked every 3-5 years. If you are 61 years of age or older, have your blood pressure checked every year. You should have your blood pressure measured twice--once when you are at a hospital or clinic, and once when you are not at a hospital or clinic. Record the average of the two measurements. To check your blood pressure when you are not at a hospital or clinic, you can use:  An automated blood pressure machine at a pharmacy.  A home blood pressure monitor.  If you are between 45 years and 39 years old, ask your health care provider if you should take aspirin to prevent strokes.  Have regular diabetes screenings. This involves taking a blood sample to check your fasting blood sugar level.  If you are at a normal weight and have a low risk for diabetes,  have this test once every three years after 37 years of age.  If you are overweight and have a high risk for diabetes, consider being tested at a younger age or more often. PREVENTING INFECTION  Hepatitis B  If you have a higher risk for hepatitis B, you should be screened for this virus. You are considered at high risk for hepatitis B if:  You were born in a country where hepatitis B is common. Ask your health care provider which countries are considered high risk.  Your parents were born in a high-risk country, and you have not been immunized against hepatitis B (hepatitis B vaccine).  You have HIV or AIDS.  You use needles to inject street drugs.  You live with someone who has hepatitis B.  You have had sex with someone who has hepatitis B.  You get hemodialysis treatment.  You take certain medicines for conditions, including cancer, organ transplantation, and autoimmune conditions. Hepatitis C  Blood testing is recommended for:  Everyone born from [birth year] through 1963.  Anyone with known risk factors for hepatitis C. Sexually transmitted infections (STIs)  You should be screened for sexually transmitted infections (STIs) including gonorrhea and chlamydia if:  You are sexually active and are younger than 37 years of age.  You are older than 37 years of age and your health care provider tells you that you are at risk for this type of infection.  Your sexual activity has changed since you were last screened and you are at an increased risk for chlamydia or gonorrhea. Ask your health care provider if you are at risk.  If you do not have HIV, but are at risk, it may be recommended that you take a prescription medicine daily to prevent HIV infection. This is called pre-exposure prophylaxis (PrEP). You are considered at risk if:  You are sexually active and do not regularly use condoms or know the HIV status of your partner(s).  You take drugs by injection.  You are sexually  active with a partner who has HIV. Talk with your health care provider about whether you are at high risk of being infected with HIV. If you choose to begin PrEP, you should first be tested for HIV. You should then be tested every 3 months for as long as you are taking PrEP.  PREGNANCY   If you are premenopausal and you may become pregnant, ask your health care provider about preconception counseling.  If you may  become pregnant, take 400 to 800 micrograms (mcg) of folic acid every day.  If you want to prevent pregnancy, talk to your health care provider about birth control (contraception). OSTEOPOROSIS AND MENOPAUSE   Osteoporosis is a disease in which the bones lose minerals and strength with aging. This can result in serious bone fractures. Your risk for osteoporosis can be identified using a bone density scan.  If you are 61 years of age or older, or if you are at risk for osteoporosis and fractures, ask your health care provider if you should be screened.  Ask your health care provider whether you should take a calcium or vitamin D supplement to lower your risk for osteoporosis.  Menopause may have certain physical symptoms and risks.  Hormone replacement therapy may reduce some of these symptoms and risks. Talk to your health care provider about whether hormone replacement therapy is right for you.  HOME CARE INSTRUCTIONS   Schedule regular health, dental, and eye exams.  Stay current with your immunizations.   Do not use any tobacco products including cigarettes, chewing tobacco, or electronic cigarettes.  If you are pregnant, do not drink alcohol.  If you are breastfeeding, limit how much and how often you drink alcohol.  Limit alcohol intake to no more than 1 drink per day for nonpregnant women. One drink equals 12 ounces of beer, 5 ounces of wine, or 1 ounces of hard liquor.  Do not use street drugs.  Do not share needles.  Ask your health care provider for help if  you need support or information about quitting drugs.  Tell your health care provider if you often feel depressed.  Tell your health care provider if you have ever been abused or do not feel safe at home.   This information is not intended to replace advice given to you by your health care provider. Make sure you discuss any questions you have with your health care provider.   Document Released: 10/06/2010 Document Revised: 04/13/2014 Document Reviewed: 02/22/2013 Elsevier Interactive Patient Education [employer].

## 2015-02-12 ENCOUNTER — Other Ambulatory Visit (INDEPENDENT_AMBULATORY_CARE_PROVIDER_SITE_OTHER): Payer: Medicare Other

## 2015-02-12 DIAGNOSIS — M25552 Pain in left hip: Secondary | ICD-10-CM | POA: Diagnosis not present

## 2015-02-12 DIAGNOSIS — Z23 Encounter for immunization: Secondary | ICD-10-CM

## 2015-02-12 DIAGNOSIS — M25551 Pain in right hip: Secondary | ICD-10-CM

## 2015-02-12 DIAGNOSIS — S343XXS Injury of cauda equina, sequela: Secondary | ICD-10-CM

## 2015-02-13 DIAGNOSIS — G8220 Paraplegia, unspecified: Secondary | ICD-10-CM | POA: Diagnosis not present

## 2015-02-13 DIAGNOSIS — M6281 Muscle weakness (generalized): Secondary | ICD-10-CM | POA: Diagnosis not present

## 2015-02-13 DIAGNOSIS — M25562 Pain in left knee: Secondary | ICD-10-CM | POA: Diagnosis not present

## 2015-02-13 DIAGNOSIS — M25561 Pain in right knee: Secondary | ICD-10-CM | POA: Diagnosis not present

## 2015-02-15 DIAGNOSIS — G8220 Paraplegia, unspecified: Secondary | ICD-10-CM | POA: Diagnosis not present

## 2015-02-15 DIAGNOSIS — M25562 Pain in left knee: Secondary | ICD-10-CM | POA: Diagnosis not present

## 2015-02-15 DIAGNOSIS — M6281 Muscle weakness (generalized): Secondary | ICD-10-CM | POA: Diagnosis not present

## 2015-02-15 DIAGNOSIS — M25561 Pain in right knee: Secondary | ICD-10-CM | POA: Diagnosis not present

## 2015-02-18 ENCOUNTER — Telehealth: Payer: Self-pay | Admitting: Family

## 2015-02-18 NOTE — Telephone Encounter (Signed)
Please call to review xray results.

## 2015-03-12 DIAGNOSIS — M6281 Muscle weakness (generalized): Secondary | ICD-10-CM | POA: Diagnosis not present

## 2015-03-12 DIAGNOSIS — M25562 Pain in left knee: Secondary | ICD-10-CM | POA: Diagnosis not present

## 2015-03-12 DIAGNOSIS — G8220 Paraplegia, unspecified: Secondary | ICD-10-CM | POA: Diagnosis not present

## 2015-03-12 DIAGNOSIS — M25561 Pain in right knee: Secondary | ICD-10-CM | POA: Diagnosis not present

## 2015-03-18 ENCOUNTER — Other Ambulatory Visit: Payer: Medicare Other

## 2015-03-19 DIAGNOSIS — G8220 Paraplegia, unspecified: Secondary | ICD-10-CM | POA: Diagnosis not present

## 2015-03-19 DIAGNOSIS — M25562 Pain in left knee: Secondary | ICD-10-CM | POA: Diagnosis not present

## 2015-03-19 DIAGNOSIS — M6281 Muscle weakness (generalized): Secondary | ICD-10-CM | POA: Diagnosis not present

## 2015-03-19 DIAGNOSIS — M25561 Pain in right knee: Secondary | ICD-10-CM | POA: Diagnosis not present

## 2015-03-22 DIAGNOSIS — G8220 Paraplegia, unspecified: Secondary | ICD-10-CM | POA: Diagnosis not present

## 2015-03-22 DIAGNOSIS — M25561 Pain in right knee: Secondary | ICD-10-CM | POA: Diagnosis not present

## 2015-03-22 DIAGNOSIS — M6281 Muscle weakness (generalized): Secondary | ICD-10-CM | POA: Diagnosis not present

## 2015-03-22 DIAGNOSIS — M25562 Pain in left knee: Secondary | ICD-10-CM | POA: Diagnosis not present

## 2015-03-28 DIAGNOSIS — G8220 Paraplegia, unspecified: Secondary | ICD-10-CM | POA: Diagnosis not present

## 2015-03-28 DIAGNOSIS — M6281 Muscle weakness (generalized): Secondary | ICD-10-CM | POA: Diagnosis not present

## 2015-03-28 DIAGNOSIS — M25561 Pain in right knee: Secondary | ICD-10-CM | POA: Diagnosis not present

## 2015-03-28 DIAGNOSIS — M25562 Pain in left knee: Secondary | ICD-10-CM | POA: Diagnosis not present

## 2015-04-03 DIAGNOSIS — M6281 Muscle weakness (generalized): Secondary | ICD-10-CM | POA: Diagnosis not present

## 2015-04-03 DIAGNOSIS — G8220 Paraplegia, unspecified: Secondary | ICD-10-CM | POA: Diagnosis not present

## 2015-04-03 DIAGNOSIS — M25562 Pain in left knee: Secondary | ICD-10-CM | POA: Diagnosis not present

## 2015-04-03 DIAGNOSIS — M25561 Pain in right knee: Secondary | ICD-10-CM | POA: Diagnosis not present

## 2015-04-17 DIAGNOSIS — M25561 Pain in right knee: Secondary | ICD-10-CM | POA: Diagnosis not present

## 2015-04-17 DIAGNOSIS — M6281 Muscle weakness (generalized): Secondary | ICD-10-CM | POA: Diagnosis not present

## 2015-04-17 DIAGNOSIS — M25562 Pain in left knee: Secondary | ICD-10-CM | POA: Diagnosis not present

## 2015-04-17 DIAGNOSIS — G8220 Paraplegia, unspecified: Secondary | ICD-10-CM | POA: Diagnosis not present

## 2015-04-29 DIAGNOSIS — M5440 Lumbago with sciatica, unspecified side: Secondary | ICD-10-CM | POA: Diagnosis not present

## 2015-04-29 DIAGNOSIS — M8580 Other specified disorders of bone density and structure, unspecified site: Secondary | ICD-10-CM | POA: Diagnosis not present

## 2015-04-29 DIAGNOSIS — M160 Bilateral primary osteoarthritis of hip: Secondary | ICD-10-CM | POA: Diagnosis not present

## 2015-04-29 DIAGNOSIS — M170 Bilateral primary osteoarthritis of knee: Secondary | ICD-10-CM | POA: Insufficient documentation

## 2015-04-29 DIAGNOSIS — G8929 Other chronic pain: Secondary | ICD-10-CM | POA: Diagnosis not present

## 2015-04-29 DIAGNOSIS — S73001A Unspecified subluxation of right hip, initial encounter: Secondary | ICD-10-CM | POA: Diagnosis not present

## 2015-04-29 DIAGNOSIS — S343XXS Injury of cauda equina, sequela: Secondary | ICD-10-CM | POA: Diagnosis not present

## 2015-04-29 DIAGNOSIS — D480 Neoplasm of uncertain behavior of bone and articular cartilage: Secondary | ICD-10-CM | POA: Diagnosis not present

## 2015-04-29 DIAGNOSIS — Z87768 Personal history of other specified (corrected) congenital malformations of integument, limbs and musculoskeletal system: Secondary | ICD-10-CM | POA: Insufficient documentation

## 2015-04-29 DIAGNOSIS — M25551 Pain in right hip: Secondary | ICD-10-CM | POA: Diagnosis not present

## 2015-04-29 DIAGNOSIS — M25552 Pain in left hip: Secondary | ICD-10-CM | POA: Diagnosis not present

## 2015-04-29 DIAGNOSIS — F17210 Nicotine dependence, cigarettes, uncomplicated: Secondary | ICD-10-CM | POA: Diagnosis not present

## 2015-05-01 DIAGNOSIS — G8220 Paraplegia, unspecified: Secondary | ICD-10-CM | POA: Diagnosis not present

## 2015-05-01 DIAGNOSIS — M25561 Pain in right knee: Secondary | ICD-10-CM | POA: Diagnosis not present

## 2015-05-01 DIAGNOSIS — M25562 Pain in left knee: Secondary | ICD-10-CM | POA: Diagnosis not present

## 2015-05-01 DIAGNOSIS — M6281 Muscle weakness (generalized): Secondary | ICD-10-CM | POA: Diagnosis not present

## 2015-05-03 DIAGNOSIS — M25561 Pain in right knee: Secondary | ICD-10-CM | POA: Diagnosis not present

## 2015-05-03 DIAGNOSIS — M6281 Muscle weakness (generalized): Secondary | ICD-10-CM | POA: Diagnosis not present

## 2015-05-03 DIAGNOSIS — M25562 Pain in left knee: Secondary | ICD-10-CM | POA: Diagnosis not present

## 2015-05-03 DIAGNOSIS — G8220 Paraplegia, unspecified: Secondary | ICD-10-CM | POA: Diagnosis not present

## 2015-05-09 DIAGNOSIS — M6281 Muscle weakness (generalized): Secondary | ICD-10-CM | POA: Diagnosis not present

## 2015-05-09 DIAGNOSIS — M25562 Pain in left knee: Secondary | ICD-10-CM | POA: Diagnosis not present

## 2015-05-09 DIAGNOSIS — M25561 Pain in right knee: Secondary | ICD-10-CM | POA: Diagnosis not present

## 2015-05-09 DIAGNOSIS — G8220 Paraplegia, unspecified: Secondary | ICD-10-CM | POA: Diagnosis not present

## 2015-05-10 ENCOUNTER — Ambulatory Visit: Payer: Medicare Other | Admitting: Family

## 2015-05-13 ENCOUNTER — Encounter: Payer: Self-pay | Admitting: Family

## 2015-05-13 DIAGNOSIS — M5135 Other intervertebral disc degeneration, thoracolumbar region: Secondary | ICD-10-CM | POA: Diagnosis not present

## 2015-05-13 DIAGNOSIS — M5440 Lumbago with sciatica, unspecified side: Secondary | ICD-10-CM | POA: Diagnosis not present

## 2015-05-13 DIAGNOSIS — Z993 Dependence on wheelchair: Secondary | ICD-10-CM | POA: Insufficient documentation

## 2015-05-13 DIAGNOSIS — M170 Bilateral primary osteoarthritis of knee: Secondary | ICD-10-CM | POA: Diagnosis not present

## 2015-05-13 DIAGNOSIS — M899 Disorder of bone, unspecified: Secondary | ICD-10-CM | POA: Diagnosis not present

## 2015-05-13 DIAGNOSIS — M9428 Chondromalacia, other site: Secondary | ICD-10-CM | POA: Diagnosis not present

## 2015-05-13 DIAGNOSIS — S343XXS Injury of cauda equina, sequela: Secondary | ICD-10-CM | POA: Diagnosis not present

## 2015-05-13 DIAGNOSIS — R29890 Loss of height: Secondary | ICD-10-CM | POA: Diagnosis not present

## 2015-05-13 DIAGNOSIS — M949 Disorder of cartilage, unspecified: Secondary | ICD-10-CM | POA: Diagnosis not present

## 2015-05-13 DIAGNOSIS — F17200 Nicotine dependence, unspecified, uncomplicated: Secondary | ICD-10-CM | POA: Insufficient documentation

## 2015-05-13 DIAGNOSIS — G8929 Other chronic pain: Secondary | ICD-10-CM | POA: Diagnosis not present

## 2015-05-13 DIAGNOSIS — Z780 Asymptomatic menopausal state: Secondary | ICD-10-CM | POA: Diagnosis not present

## 2015-05-13 DIAGNOSIS — E559 Vitamin D deficiency, unspecified: Secondary | ICD-10-CM | POA: Diagnosis not present

## 2015-05-13 DIAGNOSIS — M898X8 Other specified disorders of bone, other site: Secondary | ICD-10-CM | POA: Diagnosis not present

## 2015-05-13 DIAGNOSIS — M160 Bilateral primary osteoarthritis of hip: Secondary | ICD-10-CM | POA: Diagnosis not present

## 2015-05-14 ENCOUNTER — Other Ambulatory Visit: Payer: Self-pay | Admitting: Family

## 2015-05-14 ENCOUNTER — Encounter: Payer: Self-pay | Admitting: Family

## 2015-05-14 ENCOUNTER — Ambulatory Visit (INDEPENDENT_AMBULATORY_CARE_PROVIDER_SITE_OTHER): Payer: Medicare Other | Admitting: Family

## 2015-05-14 VITALS — BP 110/70 | HR 85 | Temp 97.1°F

## 2015-05-14 DIAGNOSIS — M4716 Other spondylosis with myelopathy, lumbar region: Secondary | ICD-10-CM

## 2015-05-14 DIAGNOSIS — M25551 Pain in right hip: Secondary | ICD-10-CM

## 2015-05-14 DIAGNOSIS — M25561 Pain in right knee: Secondary | ICD-10-CM | POA: Diagnosis not present

## 2015-05-14 DIAGNOSIS — M179 Osteoarthritis of knee, unspecified: Secondary | ICD-10-CM

## 2015-05-14 DIAGNOSIS — M25562 Pain in left knee: Secondary | ICD-10-CM

## 2015-05-14 DIAGNOSIS — S343XXS Injury of cauda equina, sequela: Secondary | ICD-10-CM

## 2015-05-14 DIAGNOSIS — M8580 Other specified disorders of bone density and structure, unspecified site: Secondary | ICD-10-CM

## 2015-05-14 DIAGNOSIS — M1712 Unilateral primary osteoarthritis, left knee: Secondary | ICD-10-CM

## 2015-05-14 DIAGNOSIS — M25552 Pain in left hip: Secondary | ICD-10-CM

## 2015-05-14 NOTE — Patient Instructions (Signed)
Health Maintenance, Female Adopting a healthy lifestyle and getting preventive care can go a long way to promote health and wellness. Talk with your health care provider about what schedule of regular examinations is right for you. This is a good chance for you to check in with your provider about disease prevention and staying healthy. In between checkups, there are plenty of things you can do on your own. Experts have done a lot of research about which lifestyle changes and preventive measures are most likely to keep you healthy. Ask your health care provider for more information. WEIGHT AND DIET  Eat a healthy diet  Be sure to include plenty of vegetables, fruits, low-fat dairy products, and lean protein.  Do not eat a lot of foods high in solid fats, added sugars, or salt.  Get regular exercise. This is one of the most important things you can do for your health.  Most adults should exercise for at least 150 minutes each week. The exercise should increase your heart rate and make you sweat (moderate-intensity exercise).  Most adults should also do strengthening exercises at least twice a week. This is in addition to the moderate-intensity exercise.  Maintain a healthy weight  Body mass index (BMI) is a measurement that can be used to identify possible weight problems. It estimates body fat based on height and weight. Your health care provider can help determine your BMI and help you achieve or maintain a healthy weight.  For females 20 years of age and older:   A BMI below 18.5 is considered underweight.  A BMI of 18.5 to 24.9 is normal.  A BMI of 25 to 29.9 is considered overweight.  A BMI of 30 and above is considered obese.  Watch levels of cholesterol and blood lipids  You should start having your blood tested for lipids and cholesterol at 38 years of age, then have this test every 5 years.  You may need to have your cholesterol levels checked more often if:  Your lipid  or cholesterol levels are high.  You are older than 38 years of age.  You are at high risk for heart disease.  CANCER SCREENING   Lung Cancer  Lung cancer screening is recommended for adults 55-80 years old who are at high risk for lung cancer because of a history of smoking.  A yearly low-dose CT scan of the lungs is recommended for people who:  Currently smoke.  Have quit within the past 15 years.  Have at least a 30-pack-year history of smoking. A pack year is smoking an average of one pack of cigarettes a day for 1 year.  Yearly screening should continue until it has been 15 years since you quit.  Yearly screening should stop if you develop a health problem that would prevent you from having lung cancer treatment.  Breast Cancer  Practice breast self-awareness. This means understanding how your breasts normally appear and feel.  It also means doing regular breast self-exams. Let your health care provider know about any changes, no matter how small.  If you are in your 20s or 30s, you should have a clinical breast exam (CBE) by a health care provider every 1-3 years as part of a regular health exam.  If you are 40 or older, have a CBE every year. Also consider having a breast X-ray (mammogram) every year.  If you have a family history of breast cancer, talk to your health care provider about genetic screening.  If you   are at high risk for breast cancer, talk to your health care provider about having an MRI and a mammogram every year.  Breast cancer gene (BRCA) assessment is recommended for women who have family members with BRCA-related cancers. BRCA-related cancers include:  Breast.  Ovarian.  Tubal.  Peritoneal cancers.  Results of the assessment will determine the need for genetic counseling and BRCA1 and BRCA2 testing. Cervical Cancer Your health care provider may recommend that you be screened regularly for cancer of the pelvic organs (ovaries, uterus, and  vagina). This screening involves a pelvic examination, including checking for microscopic changes to the surface of your cervix (Pap test). You may be encouraged to have this screening done every 3 years, beginning at age 21.  For women ages 30-65, health care providers may recommend pelvic exams and Pap testing every 3 years, or they may recommend the Pap and pelvic exam, combined with testing for human papilloma virus (HPV), every 5 years. Some types of HPV increase your risk of cervical cancer. Testing for HPV may also be done on women of any age with unclear Pap test results.  Other health care providers may not recommend any screening for nonpregnant women who are considered low risk for pelvic cancer and who do not have symptoms. Ask your health care provider if a screening pelvic exam is right for you.  If you have had past treatment for cervical cancer or a condition that could lead to cancer, you need Pap tests and screening for cancer for at least 20 years after your treatment. If Pap tests have been discontinued, your risk factors (such as having a new sexual partner) need to be reassessed to determine if screening should resume. Some women have medical problems that increase the chance of getting cervical cancer. In these cases, your health care provider may recommend more frequent screening and Pap tests. Colorectal Cancer  This type of cancer can be detected and often prevented.  Routine colorectal cancer screening usually begins at 38 years of age and continues through 38 years of age.  Your health care provider may recommend screening at an earlier age if you have risk factors for colon cancer.  Your health care provider may also recommend using home test kits to check for hidden blood in the stool.  A small camera at the end of a tube can be used to examine your colon directly (sigmoidoscopy or colonoscopy). This is done to check for the earliest forms of colorectal  cancer.  Routine screening usually begins at age 50.  Direct examination of the colon should be repeated every 5-10 years through 38 years of age. However, you may need to be screened more often if early forms of precancerous polyps or small growths are found. Skin Cancer  Check your skin from head to toe regularly.  Tell your health care provider about any new moles or changes in moles, especially if there is a change in a mole's shape or color.  Also tell your health care provider if you have a mole that is larger than the size of a pencil eraser.  Always use sunscreen. Apply sunscreen liberally and repeatedly throughout the day.  Protect yourself by wearing long sleeves, pants, a wide-brimmed hat, and sunglasses whenever you are outside. HEART DISEASE, DIABETES, AND HIGH BLOOD PRESSURE   High blood pressure causes heart disease and increases the risk of stroke. High blood pressure is more likely to develop in:  People who have blood pressure in the high end   of the normal range (130-139/85-89 mm Hg).  People who are overweight or obese.  People who are African American.  If you are 38-23 years of age, have your blood pressure checked every 3-5 years. If you are 61 years of age or older, have your blood pressure checked every year. You should have your blood pressure measured twice--once when you are at a hospital or clinic, and once when you are not at a hospital or clinic. Record the average of the two measurements. To check your blood pressure when you are not at a hospital or clinic, you can use:  An automated blood pressure machine at a pharmacy.  A home blood pressure monitor.  If you are between 45 years and 39 years old, ask your health care provider if you should take aspirin to prevent strokes.  Have regular diabetes screenings. This involves taking a blood sample to check your fasting blood sugar level.  If you are at a normal weight and have a low risk for diabetes,  have this test once every three years after 38 years of age.  If you are overweight and have a high risk for diabetes, consider being tested at a younger age or more often. PREVENTING INFECTION  Hepatitis B  If you have a higher risk for hepatitis B, you should be screened for this virus. You are considered at high risk for hepatitis B if:  You were born in a country where hepatitis B is common. Ask your health care provider which countries are considered high risk.  Your parents were born in a high-risk country, and you have not been immunized against hepatitis B (hepatitis B vaccine).  You have HIV or AIDS.  You use needles to inject street drugs.  You live with someone who has hepatitis B.  You have had sex with someone who has hepatitis B.  You get hemodialysis treatment.  You take certain medicines for conditions, including cancer, organ transplantation, and autoimmune conditions. Hepatitis C  Blood testing is recommended for:  Everyone born from [birth year] through 1963.  Anyone with known risk factors for hepatitis C. Sexually transmitted infections (STIs)  You should be screened for sexually transmitted infections (STIs) including gonorrhea and chlamydia if:  You are sexually active and are younger than 38 years of age.  You are older than 38 years of age and your health care provider tells you that you are at risk for this type of infection.  Your sexual activity has changed since you were last screened and you are at an increased risk for chlamydia or gonorrhea. Ask your health care provider if you are at risk.  If you do not have HIV, but are at risk, it may be recommended that you take a prescription medicine daily to prevent HIV infection. This is called pre-exposure prophylaxis (PrEP). You are considered at risk if:  You are sexually active and do not regularly use condoms or know the HIV status of your partner(s).  You take drugs by injection.  You are sexually  active with a partner who has HIV. Talk with your health care provider about whether you are at high risk of being infected with HIV. If you choose to begin PrEP, you should first be tested for HIV. You should then be tested every 3 months for as long as you are taking PrEP.  PREGNANCY   If you are premenopausal and you may become pregnant, ask your health care provider about preconception counseling.  If you may  become pregnant, take 400 to 800 micrograms (mcg) of folic acid every day.  If you want to prevent pregnancy, talk to your health care provider about birth control (contraception). OSTEOPOROSIS AND MENOPAUSE   Osteoporosis is a disease in which the bones lose minerals and strength with aging. This can result in serious bone fractures. Your risk for osteoporosis can be identified using a bone density scan.  If you are 61 years of age or older, or if you are at risk for osteoporosis and fractures, ask your health care provider if you should be screened.  Ask your health care provider whether you should take a calcium or vitamin D supplement to lower your risk for osteoporosis.  Menopause may have certain physical symptoms and risks.  Hormone replacement therapy may reduce some of these symptoms and risks. Talk to your health care provider about whether hormone replacement therapy is right for you.  HOME CARE INSTRUCTIONS   Schedule regular health, dental, and eye exams.  Stay current with your immunizations.   Do not use any tobacco products including cigarettes, chewing tobacco, or electronic cigarettes.  If you are pregnant, do not drink alcohol.  If you are breastfeeding, limit how much and how often you drink alcohol.  Limit alcohol intake to no more than 1 drink per day for nonpregnant women. One drink equals 12 ounces of beer, 5 ounces of wine, or 1 ounces of hard liquor.  Do not use street drugs.  Do not share needles.  Ask your health care provider for help if  you need support or information about quitting drugs.  Tell your health care provider if you often feel depressed.  Tell your health care provider if you have ever been abused or do not feel safe at home.   This information is not intended to replace advice given to you by your health care provider. Make sure you discuss any questions you have with your health care provider.   Document Released: 10/06/2010 Document Revised: 04/13/2014 Document Reviewed: 02/22/2013 Elsevier Interactive Patient Education [employer].

## 2015-05-14 NOTE — Progress Notes (Signed)
   Subjective:    Patient ID: Kim [name], female    DOB: 1975-09-12, 40 y.o.   MRN: [id number]  HPI PT presents to the office today for a face to face visit to receive a new wheelchair. PT has cauda equina spinal cord injury in 1990. Pt has been in a wheelchair since then. PT is having bilateral hip replacement and knee replacement within the next few months and is non-weight bearing until then. Pt could not do LDL's using a walker, as she is can not do any weight bearing until cleared from Ortho. PT is currently working with physical therapy. Pt has the neccessary strength in bilateral arms to propel her wheelchair.    Review of Systems  Constitutional: Negative.   HENT: Negative.   Eyes: Negative.   Respiratory: Negative.  Negative for shortness of breath.   Cardiovascular: Negative.  Negative for palpitations.  Gastrointestinal: Negative.   Endocrine: Negative.   Genitourinary: Negative.   Musculoskeletal: Positive for back pain, joint swelling and arthralgias.  Neurological: Negative.  Negative for headaches.  Hematological: Negative.   Psychiatric/Behavioral: Negative.   All other systems reviewed and are negative.      Objective:   Physical Exam  Constitutional: She is oriented to person, place, and time. She appears well-developed and well-nourished. No distress.  HENT:  Head: Normocephalic and atraumatic.  Eyes: Pupils are equal, round, and reactive to light.  Neck: Normal range of motion. Neck supple. No thyromegaly present.  Cardiovascular: Normal rate, regular rhythm, normal heart sounds and intact distal pulses.   No murmur heard. Pulmonary/Chest: Effort normal and breath sounds normal. No respiratory distress. She has no wheezes.  Abdominal: Soft. Bowel sounds are normal. She exhibits no distension. There is no tenderness.  Musculoskeletal: Normal range of motion. She exhibits edema (right LE). She exhibits no tenderness.  No reflexes present in bilateral feet.  Unable to flex or rotate.   Neurological: She is alert and oriented to person, place, and time.  Skin: Skin is warm and dry.  Psychiatric: She has a normal mood and affect. Her behavior is normal. Judgment and thought content normal.  Vitals reviewed.  BP 110/70 mmHg  Pulse 85  Temp(Src) 97.1 F (36.2 C) (Oral)  Wt      Assessment & Plan:  1. Osteoarthritis of left knee, unspecified osteoarthritis type - DME Wheelchair manual  2. SPONDYLOSIS WITH MYELOPATHY LUMBAR REGION - DME Wheelchair manual  3. Cauda equina spinal cord injury, sequela - DME Wheelchair manual  4. Hip pain, bilateral - DME Wheelchair manual  5. Arthralgia of both lower legs - DME Wheelchair manual  Continue working with physical thearpy  Keep all appointments with Ortho   [name], FNP

## 2015-05-21 ENCOUNTER — Ambulatory Visit (INDEPENDENT_AMBULATORY_CARE_PROVIDER_SITE_OTHER): Payer: Medicare Other

## 2015-05-21 DIAGNOSIS — M899 Disorder of bone, unspecified: Secondary | ICD-10-CM | POA: Diagnosis not present

## 2015-05-21 DIAGNOSIS — M8580 Other specified disorders of bone density and structure, unspecified site: Secondary | ICD-10-CM

## 2015-05-23 DIAGNOSIS — M85862 Other specified disorders of bone density and structure, left lower leg: Secondary | ICD-10-CM | POA: Diagnosis not present

## 2015-05-23 DIAGNOSIS — M160 Bilateral primary osteoarthritis of hip: Secondary | ICD-10-CM | POA: Diagnosis not present

## 2015-05-23 DIAGNOSIS — M162 Bilateral osteoarthritis resulting from hip dysplasia: Secondary | ICD-10-CM | POA: Diagnosis not present

## 2015-05-23 DIAGNOSIS — M25461 Effusion, right knee: Secondary | ICD-10-CM | POA: Diagnosis not present

## 2015-05-23 DIAGNOSIS — M21852 Other specified acquired deformities of left thigh: Secondary | ICD-10-CM | POA: Diagnosis not present

## 2015-05-23 DIAGNOSIS — M85861 Other specified disorders of bone density and structure, right lower leg: Secondary | ICD-10-CM | POA: Diagnosis not present

## 2015-05-23 DIAGNOSIS — M21851 Other specified acquired deformities of right thigh: Secondary | ICD-10-CM | POA: Diagnosis not present

## 2015-05-23 DIAGNOSIS — Z993 Dependence on wheelchair: Secondary | ICD-10-CM | POA: Diagnosis not present

## 2015-05-23 DIAGNOSIS — M818 Other osteoporosis without current pathological fracture: Secondary | ICD-10-CM | POA: Diagnosis not present

## 2015-05-23 DIAGNOSIS — D480 Neoplasm of uncertain behavior of bone and articular cartilage: Secondary | ICD-10-CM | POA: Diagnosis not present

## 2015-05-23 DIAGNOSIS — Z7901 Long term (current) use of anticoagulants: Secondary | ICD-10-CM | POA: Diagnosis not present

## 2015-05-23 DIAGNOSIS — F17210 Nicotine dependence, cigarettes, uncomplicated: Secondary | ICD-10-CM | POA: Diagnosis not present

## 2015-05-23 DIAGNOSIS — M25762 Osteophyte, left knee: Secondary | ICD-10-CM | POA: Diagnosis not present

## 2015-05-23 DIAGNOSIS — M170 Bilateral primary osteoarthritis of knee: Secondary | ICD-10-CM | POA: Diagnosis not present

## 2015-05-23 DIAGNOSIS — M25761 Osteophyte, right knee: Secondary | ICD-10-CM | POA: Diagnosis not present

## 2015-06-06 ENCOUNTER — Telehealth: Payer: Self-pay | Admitting: Family

## 2015-06-06 NOTE — Telephone Encounter (Signed)
Will call obgyn to schedule

## 2015-06-13 ENCOUNTER — Other Ambulatory Visit: Payer: Self-pay | Admitting: Family

## 2015-06-26 ENCOUNTER — Other Ambulatory Visit: Payer: Medicare Other | Admitting: Adult Health

## 2015-06-28 DIAGNOSIS — N319 Neuromuscular dysfunction of bladder, unspecified: Secondary | ICD-10-CM | POA: Insufficient documentation

## 2015-06-28 DIAGNOSIS — M1631 Unilateral osteoarthritis resulting from hip dysplasia, right hip: Secondary | ICD-10-CM | POA: Diagnosis not present

## 2015-06-28 DIAGNOSIS — F17210 Nicotine dependence, cigarettes, uncomplicated: Secondary | ICD-10-CM | POA: Diagnosis not present

## 2015-06-28 DIAGNOSIS — M160 Bilateral primary osteoarthritis of hip: Secondary | ICD-10-CM | POA: Diagnosis not present

## 2015-06-28 DIAGNOSIS — Z96652 Presence of left artificial knee joint: Secondary | ICD-10-CM | POA: Diagnosis not present

## 2015-06-28 DIAGNOSIS — R7309 Other abnormal glucose: Secondary | ICD-10-CM | POA: Diagnosis not present

## 2015-07-11 ENCOUNTER — Encounter: Payer: Self-pay | Admitting: Adult Health

## 2015-07-11 ENCOUNTER — Other Ambulatory Visit: Payer: Medicare Other | Admitting: Adult Health

## 2015-07-22 DIAGNOSIS — Z8711 Personal history of peptic ulcer disease: Secondary | ICD-10-CM | POA: Diagnosis not present

## 2015-07-22 DIAGNOSIS — E559 Vitamin D deficiency, unspecified: Secondary | ICD-10-CM | POA: Diagnosis present

## 2015-07-22 DIAGNOSIS — G8918 Other acute postprocedural pain: Secondary | ICD-10-CM | POA: Diagnosis not present

## 2015-07-22 DIAGNOSIS — M1611 Unilateral primary osteoarthritis, right hip: Secondary | ICD-10-CM | POA: Diagnosis not present

## 2015-07-22 DIAGNOSIS — G834 Cauda equina syndrome: Secondary | ICD-10-CM | POA: Diagnosis present

## 2015-07-22 DIAGNOSIS — F17210 Nicotine dependence, cigarettes, uncomplicated: Secondary | ICD-10-CM | POA: Diagnosis present

## 2015-07-22 DIAGNOSIS — D62 Acute posthemorrhagic anemia: Secondary | ICD-10-CM | POA: Diagnosis not present

## 2015-07-22 DIAGNOSIS — Z993 Dependence on wheelchair: Secondary | ICD-10-CM | POA: Diagnosis not present

## 2015-07-22 DIAGNOSIS — Z471 Aftercare following joint replacement surgery: Secondary | ICD-10-CM | POA: Diagnosis not present

## 2015-07-22 DIAGNOSIS — Z79899 Other long term (current) drug therapy: Secondary | ICD-10-CM | POA: Diagnosis not present

## 2015-07-22 DIAGNOSIS — Z96641 Presence of right artificial hip joint: Secondary | ICD-10-CM | POA: Diagnosis not present

## 2015-07-22 DIAGNOSIS — S34109S Unspecified injury to unspecified level of lumbar spinal cord, sequela: Secondary | ICD-10-CM | POA: Diagnosis not present

## 2015-07-24 DIAGNOSIS — Z96641 Presence of right artificial hip joint: Secondary | ICD-10-CM | POA: Insufficient documentation

## 2015-07-29 ENCOUNTER — Ambulatory Visit: Payer: Medicare Other | Admitting: Physical Therapy

## 2015-07-29 DIAGNOSIS — Z9181 History of falling: Secondary | ICD-10-CM | POA: Diagnosis not present

## 2015-07-29 DIAGNOSIS — M810 Age-related osteoporosis without current pathological fracture: Secondary | ICD-10-CM | POA: Diagnosis not present

## 2015-07-29 DIAGNOSIS — Z809 Family history of malignant neoplasm, unspecified: Secondary | ICD-10-CM | POA: Diagnosis not present

## 2015-07-29 DIAGNOSIS — G8191 Hemiplegia, unspecified affecting right dominant side: Secondary | ICD-10-CM | POA: Diagnosis present

## 2015-07-29 DIAGNOSIS — Z96641 Presence of right artificial hip joint: Secondary | ICD-10-CM | POA: Diagnosis not present

## 2015-07-29 DIAGNOSIS — F17210 Nicotine dependence, cigarettes, uncomplicated: Secondary | ICD-10-CM | POA: Diagnosis present

## 2015-07-29 DIAGNOSIS — Z8261 Family history of arthritis: Secondary | ICD-10-CM | POA: Diagnosis not present

## 2015-07-29 DIAGNOSIS — M25351 Other instability, right hip: Secondary | ICD-10-CM | POA: Diagnosis present

## 2015-07-29 DIAGNOSIS — Z471 Aftercare following joint replacement surgery: Secondary | ICD-10-CM | POA: Diagnosis not present

## 2015-07-29 DIAGNOSIS — D62 Acute posthemorrhagic anemia: Secondary | ICD-10-CM | POA: Diagnosis not present

## 2015-07-29 DIAGNOSIS — T84020A Dislocation of internal right hip prosthesis, initial encounter: Secondary | ICD-10-CM | POA: Diagnosis not present

## 2015-07-29 DIAGNOSIS — R739 Hyperglycemia, unspecified: Secondary | ICD-10-CM | POA: Diagnosis present

## 2015-07-29 DIAGNOSIS — Z993 Dependence on wheelchair: Secondary | ICD-10-CM | POA: Diagnosis not present

## 2015-07-29 DIAGNOSIS — G8918 Other acute postprocedural pain: Secondary | ICD-10-CM | POA: Diagnosis not present

## 2015-07-29 DIAGNOSIS — G8220 Paraplegia, unspecified: Secondary | ICD-10-CM | POA: Diagnosis present

## 2015-07-29 DIAGNOSIS — R279 Unspecified lack of coordination: Secondary | ICD-10-CM | POA: Diagnosis not present

## 2015-07-29 DIAGNOSIS — G8194 Hemiplegia, unspecified affecting left nondominant side: Secondary | ICD-10-CM | POA: Diagnosis present

## 2015-07-29 DIAGNOSIS — Z79899 Other long term (current) drug therapy: Secondary | ICD-10-CM | POA: Diagnosis not present

## 2015-07-29 DIAGNOSIS — M25551 Pain in right hip: Secondary | ICD-10-CM | POA: Diagnosis not present

## 2015-07-29 DIAGNOSIS — S73004A Unspecified dislocation of right hip, initial encounter: Secondary | ICD-10-CM | POA: Diagnosis not present

## 2015-07-29 DIAGNOSIS — T40695A Adverse effect of other narcotics, initial encounter: Secondary | ICD-10-CM | POA: Diagnosis not present

## 2015-07-29 DIAGNOSIS — M1612 Unilateral primary osteoarthritis, left hip: Secondary | ICD-10-CM | POA: Diagnosis present

## 2015-07-29 DIAGNOSIS — Z9989 Dependence on other enabling machines and devices: Secondary | ICD-10-CM | POA: Diagnosis not present

## 2015-07-29 DIAGNOSIS — Z87828 Personal history of other (healed) physical injury and trauma: Secondary | ICD-10-CM | POA: Diagnosis not present

## 2015-07-29 DIAGNOSIS — E559 Vitamin D deficiency, unspecified: Secondary | ICD-10-CM | POA: Diagnosis present

## 2015-07-29 DIAGNOSIS — N318 Other neuromuscular dysfunction of bladder: Secondary | ICD-10-CM | POA: Diagnosis present

## 2015-07-29 DIAGNOSIS — D649 Anemia, unspecified: Secondary | ICD-10-CM | POA: Diagnosis not present

## 2015-07-29 DIAGNOSIS — Z833 Family history of diabetes mellitus: Secondary | ICD-10-CM | POA: Diagnosis not present

## 2015-07-29 DIAGNOSIS — Z7982 Long term (current) use of aspirin: Secondary | ICD-10-CM | POA: Diagnosis not present

## 2015-07-29 DIAGNOSIS — K5900 Constipation, unspecified: Secondary | ICD-10-CM | POA: Diagnosis not present

## 2015-07-29 DIAGNOSIS — K5903 Drug induced constipation: Secondary | ICD-10-CM | POA: Diagnosis not present

## 2015-07-29 DIAGNOSIS — F17200 Nicotine dependence, unspecified, uncomplicated: Secondary | ICD-10-CM | POA: Diagnosis not present

## 2015-07-29 DIAGNOSIS — M169 Osteoarthritis of hip, unspecified: Secondary | ICD-10-CM | POA: Diagnosis not present

## 2015-07-29 DIAGNOSIS — M170 Bilateral primary osteoarthritis of knee: Secondary | ICD-10-CM | POA: Diagnosis present

## 2015-08-01 DIAGNOSIS — S73004A Unspecified dislocation of right hip, initial encounter: Secondary | ICD-10-CM | POA: Insufficient documentation

## 2015-08-08 ENCOUNTER — Ambulatory Visit: Payer: Medicare Other | Admitting: Physical Therapy

## 2015-08-08 NOTE — Therapy (Signed)
[facility] [place], [place], [address] Phone: [phone]   Fax:  [phone]  Patient Details  Name: Kim [name] MRN: [id number] Date of Birth: 1975/09/30 Referring Provider:  Benjaman Lobe  Encounter Date: 08/08/2015 The patient presented to the clinic s/p right total hip replacement.  She was very reluctant about starting therapy today as she as already had her hip "pop out" and had to have another surgery.  She still has stitches and her post-surgical dressing intact.  She states she is under so many precautions she does not feel safe doing anything.  She had just one home health visit and all did did was a sliding border transfer (patient is WC bound due to a spinal cord injury many years ago).  Further, she states the referral she has was following her first surgery and not the second surgery.  She sees the Dr. Marylene Buerger and will ask whether he wants her to start therapy at this time or not.  Should he want her to begin she will obtain an update referral and call us.  APP[place], [place] MPT 08/08/2015, 2:05 PM  [facility] [address] [place], [place], [address] Phone: [phone]   Fax:  [phone]

## 2015-08-09 DIAGNOSIS — Z471 Aftercare following joint replacement surgery: Secondary | ICD-10-CM | POA: Diagnosis not present

## 2015-08-09 DIAGNOSIS — Z96641 Presence of right artificial hip joint: Secondary | ICD-10-CM | POA: Diagnosis not present

## 2015-09-03 ENCOUNTER — Ambulatory Visit: Payer: Medicare Other | Admitting: Physical Therapy

## 2015-09-05 DIAGNOSIS — Z471 Aftercare following joint replacement surgery: Secondary | ICD-10-CM | POA: Diagnosis not present

## 2015-09-05 DIAGNOSIS — Z96641 Presence of right artificial hip joint: Secondary | ICD-10-CM | POA: Diagnosis not present

## 2015-09-16 ENCOUNTER — Ambulatory Visit: Payer: Medicare Other | Admitting: Physical Therapy

## 2015-09-18 ENCOUNTER — Ambulatory Visit: Payer: Medicare Other | Attending: Orthopaedic Surgery | Admitting: Physical Therapy

## 2015-09-18 DIAGNOSIS — M25551 Pain in right hip: Secondary | ICD-10-CM | POA: Diagnosis not present
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
## 2015-09-24 ENCOUNTER — Encounter: Payer: Self-pay | Admitting: Physical Therapy

## 2015-09-24 ENCOUNTER — Ambulatory Visit: Payer: Medicare Other | Admitting: Physical Therapy

## 2015-09-24 DIAGNOSIS — M25551 Pain in right hip: Secondary | ICD-10-CM
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
## 2015-09-26 ENCOUNTER — Encounter: Payer: Medicare Other | Admitting: Physical Therapy

## 2015-10-01 ENCOUNTER — Ambulatory Visit: Payer: Medicare Other | Admitting: Physical Therapy

## 2015-10-01 ENCOUNTER — Encounter: Payer: Self-pay | Admitting: Physical Therapy

## 2015-10-01 DIAGNOSIS — M25551 Pain in right hip: Secondary | ICD-10-CM | POA: Diagnosis not present
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
## 2015-10-03 ENCOUNTER — Encounter: Payer: Medicare Other | Admitting: Physical Therapy

## 2015-10-15 ENCOUNTER — Encounter: Payer: Self-pay | Admitting: Physical Therapy

## 2015-10-15 ENCOUNTER — Ambulatory Visit: Payer: Medicare Other | Attending: Orthopaedic Surgery | Admitting: Physical Therapy

## 2015-10-15 DIAGNOSIS — M25551 Pain in right hip: Secondary | ICD-10-CM | POA: Diagnosis not present
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
## 2015-10-17 ENCOUNTER — Ambulatory Visit: Payer: Medicare Other | Admitting: Physical Therapy

## 2015-10-17 ENCOUNTER — Encounter: Payer: Self-pay | Admitting: Physical Therapy

## 2015-10-17 DIAGNOSIS — M25551 Pain in right hip: Secondary | ICD-10-CM
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
## 2015-10-22 ENCOUNTER — Encounter: Payer: Medicare Other | Admitting: Physical Therapy

## 2015-10-24 ENCOUNTER — Ambulatory Visit: Payer: Medicare Other | Admitting: Physical Therapy

## 2015-10-24 DIAGNOSIS — M25551 Pain in right hip: Secondary | ICD-10-CM
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
## 2015-10-29 ENCOUNTER — Encounter: Payer: Medicare Other | Admitting: Physical Therapy

## 2015-10-31 ENCOUNTER — Ambulatory Visit: Payer: Medicare Other | Admitting: Physical Therapy

## 2015-10-31 DIAGNOSIS — M25551 Pain in right hip: Secondary | ICD-10-CM
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
## 2015-11-05 DIAGNOSIS — Q6589 Other specified congenital deformities of hip: Secondary | ICD-10-CM | POA: Diagnosis not present

## 2015-11-05 DIAGNOSIS — Z471 Aftercare following joint replacement surgery: Secondary | ICD-10-CM | POA: Diagnosis not present

## 2015-11-05 DIAGNOSIS — Z96641 Presence of right artificial hip joint: Secondary | ICD-10-CM | POA: Diagnosis not present

## 2015-11-06 ENCOUNTER — Ambulatory Visit: Payer: Medicare Other | Attending: Orthopaedic Surgery | Admitting: Physical Therapy

## 2015-11-06 DIAGNOSIS — M25551 Pain in right hip: Secondary | ICD-10-CM | POA: Insufficient documentation

## 2015-11-14 ENCOUNTER — Ambulatory Visit: Payer: Medicare Other | Admitting: Physical Therapy

## 2015-11-14 ENCOUNTER — Encounter: Payer: Self-pay | Admitting: Physical Therapy

## 2015-11-14 DIAGNOSIS — M25551 Pain in right hip: Secondary | ICD-10-CM | POA: Diagnosis not present
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
## 2015-11-19 ENCOUNTER — Encounter: Payer: Medicare Other | Admitting: Physical Therapy

## 2015-11-21 ENCOUNTER — Ambulatory Visit: Payer: Medicare Other | Admitting: Physical Therapy

## 2015-11-21 ENCOUNTER — Encounter: Payer: Self-pay | Admitting: Physical Therapy

## 2015-11-21 DIAGNOSIS — M25551 Pain in right hip: Secondary | ICD-10-CM
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
## 2015-11-28 ENCOUNTER — Ambulatory Visit (INDEPENDENT_AMBULATORY_CARE_PROVIDER_SITE_OTHER): Payer: Medicare Other | Admitting: Pediatrics

## 2015-11-28 ENCOUNTER — Encounter: Payer: Medicare Other | Admitting: *Deleted

## 2015-11-28 ENCOUNTER — Encounter: Payer: Self-pay | Admitting: Pediatrics

## 2015-11-28 VITALS — BP 104/70 | HR 112 | Temp 97.0°F | Ht 61.0 in

## 2015-11-28 DIAGNOSIS — R197 Diarrhea, unspecified: Secondary | ICD-10-CM

## 2015-11-28 DIAGNOSIS — R509 Fever, unspecified: Secondary | ICD-10-CM

## 2015-11-28 DIAGNOSIS — R110 Nausea: Secondary | ICD-10-CM | POA: Diagnosis not present

## 2015-11-28 MED ORDER — ONDANSETRON 4 MG PO TBDP: 4.0000 mg | ORAL_TABLET | Freq: Three times a day (TID) | ORAL | 0 refills | Status: DC | PRN

## 2015-11-28 NOTE — Patient Instructions (Signed)
Ibuprofen 400mg  every 4 hours as needed Lots of fluids Anti-nausea medicine as needed Let me know if getting worse or if not getting better by early next week

## 2015-11-28 NOTE — Progress Notes (Signed)
  Subjective:   Patient ID: Kim [name], female    DOB: 18-Jun-1975, 40 y.o.   MRN: [id number] CC: Fatigue; Diarrhea; and TONGUE WHITE  HPI: Kim [name] is a 40 y.o. female presenting for Fatigue; Diarrhea; and TONGUE WHITE  Started two days ago getting sick] Diarrhea and nausea that day Straight water this morning None since apprx 2pm Had 5-6 episodes total today amd yesterday No blood No vomiting Yesterday took it easy but sitting outside  Today headache still present, woke up with coated white tongue Taking some ibuprofen 25m once or twice a day for headache No fevers   Relevant past medical, surgical, family and social history reviewed. Allergies and medications reviewed and updated. History  Smoking Status  . Current Some Day Smoker  . Packs/day: 1.00  . Years: 15.00  . Types: Cigarettes  Smokeless Tobacco  . Current User   ROS: Per HPI   Objective:    BP 104/70 (BP Location: Left Arm, Patient Position: Sitting, Cuff Size: Large)   Pulse (!) 112   Temp 97 F (36.1 C) (Oral)   Ht _0  (1.549 m)   Wt Readings from Last 3 Encounters:  11/30/14 126 lb (57.2 kg)  08/02/13 120 lb (54.4 kg)  06/16/13 120 lb (54.4 kg)   Gen: NAD, alert, cooperative with exam, NCAT EYES: EOMI, no conjunctival injection, or no icterus ENT:  TMs pearly gray b/l, OP without erythema, tongue white, no bleeding when scraped CV: NRRR, normal S1/S2, no murmur, distal pulses 2+ b/l Resp: CTABL, no wheezes, normal WOB Abd: +BS, soft, NTND. no guarding or organomegaly Ext: No edema, warm Neuro: Alert and oriented MSK: no pain with ROM hips, knees b/l  Assessment & Plan:  NTeressawas seen today for fatigue, diarrhea and tongue white. Symptoms likely due to acute viral illness. If not improving by early next week, will test for C diff Two weeks ago with 4 doses of abx for ppx for dental work with hip replacement hx No abd pain, +headache, no sweats If having temperatures needs  to be seen  Diagnoses and all orders for this visit:  Fever, unspecified fever cause -     CBC with Differential/Platelet  Diarrhea, unspecified type -     CMP14+EGFR  Nausea without vomiting -     ondansetron (ZOFRAN-ODT) 4 MG disintegrating tablet; Take 1 tablet (4 mg total) by mouth every 8 (eight) hours as needed for nausea or vomiting.   Follow up plan: As needed CAssunta Found MD WMcCracken

## 2015-11-29 LAB — CMP14+EGFR
A/G RATIO: 1.1 — AB (ref 1.2–2.2)
ALBUMIN: 3.9 g/dL (ref 3.5–5.5)
ALK PHOS: 103 IU/L (ref 39–117)
ALT: 8 IU/L (ref 0–32)
AST: 15 IU/L (ref 0–40)
BILIRUBIN TOTAL: 0.4 mg/dL (ref 0.0–1.2)
BUN / CREAT RATIO: 9 (ref 9–23)
BUN: 7 mg/dL (ref 6–20)
CHLORIDE: 95 mmol/L — AB (ref 96–106)
CO2: 28 mmol/L (ref 18–29)
Calcium: 8.8 mg/dL (ref 8.7–10.2)
Creatinine, Ser: 0.78 mg/dL (ref 0.57–1.00)
GFR calc Af Amer: 112 mL/min/{1.73_m2} (ref >59)
GFR calc non Af Amer: 97 mL/min/{1.73_m2} (ref >59)
GLOBULIN, TOTAL: 3.4 g/dL (ref 1.5–4.5)
Glucose: 93 mg/dL (ref 65–99)
Potassium: 3.8 mmol/L (ref 3.5–5.2)
SODIUM: 136 mmol/L (ref 134–144)
Total Protein: 7.3 g/dL (ref 6.0–8.5)

## 2015-11-29 LAB — CBC WITH DIFFERENTIAL/PLATELET
BASOS ABS: 0 10*3/uL (ref 0.0–0.2)
Basos: 0 %
EOS (ABSOLUTE): 0 10*3/uL (ref 0.0–0.4)
Eos: 0 %
HEMATOCRIT: 39.2 % (ref 34.0–46.6)
HEMOGLOBIN: 12.9 g/dL (ref 11.1–15.9)
Immature Grans (Abs): 0 10*3/uL (ref 0.0–0.1)
Immature Granulocytes: 0 %
LYMPHS ABS: 2.1 10*3/uL (ref 0.7–3.1)
Lymphs: 21 %
MCH: 29.2 pg (ref 26.6–33.0)
MCHC: 32.9 g/dL (ref 31.5–35.7)
MCV: 89 fL (ref 79–97)
MONOCYTES: 9 %
Monocytes Absolute: 0.9 10*3/uL (ref 0.1–0.9)
NEUTROS ABS: 6.7 10*3/uL (ref 1.4–7.0)
Neutrophils: 70 %
Platelets: 259 10*3/uL (ref 150–379)
RBC: 4.42 x10E6/uL (ref 3.77–5.28)
RDW: 17 % — ABNORMAL HIGH (ref 12.3–15.4)
WBC: 9.7 10*3/uL (ref 3.4–10.8)

## 2015-11-29 NOTE — Progress Notes (Signed)
Patient aware.

## 2015-12-05 ENCOUNTER — Ambulatory Visit: Payer: Medicare Other | Admitting: Physical Therapy

## 2015-12-05 DIAGNOSIS — M25551 Pain in right hip: Secondary | ICD-10-CM | POA: Diagnosis not present
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
## 2016-01-02 ENCOUNTER — Ambulatory Visit: Payer: Medicare Other

## 2016-01-02 ENCOUNTER — Ambulatory Visit: Payer: Medicare Other | Admitting: *Deleted

## 2016-01-09 ENCOUNTER — Encounter: Payer: Medicare Other | Admitting: *Deleted

## 2016-01-09 DIAGNOSIS — F17200 Nicotine dependence, unspecified, uncomplicated: Secondary | ICD-10-CM | POA: Diagnosis not present

## 2016-01-09 DIAGNOSIS — Z993 Dependence on wheelchair: Secondary | ICD-10-CM | POA: Diagnosis not present

## 2016-01-09 DIAGNOSIS — M8580 Other specified disorders of bone density and structure, unspecified site: Secondary | ICD-10-CM | POA: Diagnosis not present

## 2016-01-09 DIAGNOSIS — R29890 Loss of height: Secondary | ICD-10-CM | POA: Diagnosis not present

## 2016-01-15 ENCOUNTER — Ambulatory Visit (INDEPENDENT_AMBULATORY_CARE_PROVIDER_SITE_OTHER): Payer: Medicare Other

## 2016-01-15 DIAGNOSIS — Z23 Encounter for immunization: Secondary | ICD-10-CM

## 2016-01-16 ENCOUNTER — Ambulatory Visit: Payer: Medicare Other | Attending: Orthopaedic Surgery | Admitting: Physical Therapy

## 2016-01-16 DIAGNOSIS — M25562 Pain in left knee: Secondary | ICD-10-CM | POA: Insufficient documentation

## 2016-01-16 DIAGNOSIS — G8929 Other chronic pain: Secondary | ICD-10-CM | POA: Diagnosis not present

## 2016-01-16 DIAGNOSIS — M25551 Pain in right hip: Secondary | ICD-10-CM | POA: Insufficient documentation
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
## 2016-01-21 ENCOUNTER — Ambulatory Visit (INDEPENDENT_AMBULATORY_CARE_PROVIDER_SITE_OTHER): Payer: Medicare Other | Admitting: Family

## 2016-01-21 ENCOUNTER — Encounter: Payer: Self-pay | Admitting: Family

## 2016-01-21 VITALS — BP 116/75 | HR 91 | Temp 97.0°F

## 2016-01-21 DIAGNOSIS — M25551 Pain in right hip: Secondary | ICD-10-CM

## 2016-01-21 DIAGNOSIS — M1712 Unilateral primary osteoarthritis, left knee: Secondary | ICD-10-CM

## 2016-01-21 DIAGNOSIS — M25562 Pain in left knee: Secondary | ICD-10-CM

## 2016-01-21 DIAGNOSIS — M25552 Pain in left hip: Secondary | ICD-10-CM

## 2016-01-21 DIAGNOSIS — F411 Generalized anxiety disorder: Secondary | ICD-10-CM | POA: Diagnosis not present

## 2016-01-21 DIAGNOSIS — S343XXS Injury of cauda equina, sequela: Secondary | ICD-10-CM

## 2016-01-21 DIAGNOSIS — M25561 Pain in right knee: Secondary | ICD-10-CM

## 2016-01-21 MED ORDER — ESCITALOPRAM OXALATE 10 MG PO TABS: 10.0000 mg | ORAL_TABLET | Freq: Every day | ORAL | 3 refills | Status: DC

## 2016-01-21 NOTE — Patient Instructions (Signed)
Stress and Stress Management Stress is a normal reaction to life events. It is what you feel when life demands more than you are used to or more than you can handle. Some stress can be useful. For example, the stress reaction can help you catch the last bus of the day, study for a test, or meet a deadline at work. But stress that occurs too often or for too long can cause problems. It can affect your emotional health and interfere with relationships and normal daily activities. Too much stress can weaken your immune system and increase your risk for physical illness. If you already have a medical problem, stress can make it worse. CAUSES  All sorts of life events may cause stress. An event that causes stress for one person may not be stressful for another person. Major life events commonly cause stress. These may be positive or negative. Examples include losing your job, moving into a new home, getting married, having a baby, or losing a loved one. Less obvious life events may also cause stress, especially if they occur day after day or in combination. Examples include working long hours, driving in traffic, caring for children, being in debt, or being in a difficult relationship. SIGNS AND SYMPTOMS Stress may cause emotional symptoms including, the following:  Anxiety. This is feeling worried, afraid, on edge, overwhelmed, or out of control.  Anger. This is feeling irritated or impatient.  Depression. This is feeling sad, down, helpless, or guilty.  Difficulty focusing, remembering, or making decisions. Stress may cause physical symptoms, including the following:   Aches and pains. These may affect your head, neck, back, stomach, or other areas of your body.  Tight muscles or clenched jaw.  Low energy or trouble sleeping. Stress may cause unhealthy behaviors, including the following:   Eating to feel better (overeating) or skipping meals.  Sleeping too little, too much, or both.  Working  too much or putting off tasks (procrastination).  Smoking, drinking alcohol, or using drugs to feel better. DIAGNOSIS  Stress is diagnosed through an assessment by your health care provider. Your health care provider will ask questions about your symptoms and any stressful life events.Your health care provider will also ask about your medical history and may order blood tests or other tests. Certain medical conditions and medicine can cause physical symptoms similar to stress. Mental illness can cause emotional symptoms and unhealthy behaviors similar to stress. Your health care provider may refer you to a mental health professional for further evaluation.  TREATMENT  Stress management is the recommended treatment for stress.The goals of stress management are reducing stressful life events and coping with stress in healthy ways.  Techniques for reducing stressful life events include the following:  Stress identification. Self-monitor for stress and identify what causes stress for you. These skills may help you to avoid some stressful events.  Time management. Set your priorities, keep a calendar of events, and learn to say "no." These tools can help you avoid making too many commitments. Techniques for coping with stress include the following:  Rethinking the problem. Try to think realistically about stressful events rather than ignoring them or overreacting. Try to find the positives in a stressful situation rather than focusing on the negatives.  Exercise. Physical exercise can release both physical and emotional tension. The key is to find a form of exercise you enjoy and do it regularly.  Relaxation techniques. These relax the body and mind. Examples include yoga, meditation, tai chi, biofeedback, deep  breathing, progressive muscle relaxation, listening to music, being out in nature, journaling, and other hobbies. Again, the key is to find one or more that you enjoy and can do  regularly.  Healthy lifestyle. Eat a balanced diet, get plenty of sleep, and do not smoke. Avoid using alcohol or drugs to relax.  Strong support network. Spend time with family, friends, or other people you enjoy being around.Express your feelings and talk things over with someone you trust. Counseling or talktherapy with a mental health professional may be helpful if you are having difficulty managing stress on your own. Medicine is typically not recommended for the treatment of stress.Talk to your health care provider if you think you need medicine for symptoms of stress. HOME CARE INSTRUCTIONS  Keep all follow-up visits as directed by your health care provider.  Take all medicines as directed by your health care provider. SEEK MEDICAL CARE IF:  Your symptoms get worse or you start having new symptoms.  You feel overwhelmed by your problems and can no longer manage them on your own. SEEK IMMEDIATE MEDICAL CARE IF:  You feel like hurting yourself or someone else.   This information is not intended to replace advice given to you by your health care provider. Make sure you discuss any questions you have with your health care provider.   Document Released: 09/16/2000 Document Revised: 04/13/2014 Document Reviewed: 11/15/2012 Elsevier Interactive Patient Education 2016 Elsevier Inc.  

## 2016-01-21 NOTE — Progress Notes (Signed)
   Subjective:    Patient ID: Kim [name], female    DOB: [birth year], 40 y.o.   MRN: [id number]  HPI Pt presents to the office today for a referral to Pain Clinic for chronic lower back pain, right hip, and bilateral knees. PT has cauda equina spinal cord injury and had a right hip replacement 07/22/15. Pt states a week later her hip "popped out" and had to have another hip replacement.  Pt states she has constant pain of 6 out 10. Pt is followed by Ortho.   PT states she is having increase in anxiety over the last few weeks. Pt states she has had a lot going on in her life and feels stressed. PT does not currently not taking any medications.    Review of Systems  Respiratory: Negative.   Cardiovascular: Negative.   Gastrointestinal: Negative.   Genitourinary: Negative.   Musculoskeletal: Positive for arthralgias, back pain, gait problem and joint swelling.  Psychiatric/Behavioral: Positive for decreased concentration. The patient is nervous/anxious.   All other systems reviewed and are negative.      Objective:   Physical Exam  Constitutional: She appears well-developed and well-nourished.  HENT:  Head: Normocephalic.  Eyes: Pupils are equal, round, and reactive to light.  Neck: Normal range of motion. Neck supple.  Cardiovascular: Normal rate, regular rhythm, normal heart sounds and intact distal pulses.   Pulmonary/Chest: Effort normal and breath sounds normal. No respiratory distress. She has no wheezes.  Abdominal: Soft. Bowel sounds are normal.  Musculoskeletal:  Pt is in wheelchair. Can not stand, has limited ROM of bilateral legs. PT states she can flex her legs greater than 20 degrees. PT currently working with PT.       BP 116/75   Pulse 91   Temp 97 F (36.1 C) (Oral)      Assessment & Plan:  1. Cauda equina spinal cord injury, sequela - Ambulatory referral to Pain Clinic  2. Osteoarthritis of left knee, unspecified osteoarthritis type - Ambulatory  referral to Pain Clinic  3. Hip pain, bilateral - Ambulatory referral to Pain Clinic  4. Arthralgia of both lower legs - Ambulatory referral to Pain Clinic  5. GAD (generalized anxiety disorder) -Pt started on lexapro 10 mg today -Stress management discussed -Relaxing techniques discussed -RTO 4-6 weeks to recheck - escitalopram (LEXAPRO) 10 MG tablet; Take 1 tablet (10 mg total) by mouth daily.  Dispense: 90 tablet; Refill: 3   Continue to work with Physical Therapy and keep Ortho appts  [name], FNP

## 2016-01-22 ENCOUNTER — Encounter: Payer: Self-pay | Admitting: Physical Therapy

## 2016-01-22 ENCOUNTER — Ambulatory Visit: Payer: Medicare Other | Admitting: Physical Therapy

## 2016-01-22 DIAGNOSIS — G8929 Other chronic pain: Secondary | ICD-10-CM | POA: Diagnosis not present

## 2016-01-22 DIAGNOSIS — M25551 Pain in right hip: Secondary | ICD-10-CM | POA: Diagnosis not present

## 2016-01-22 DIAGNOSIS — M25562 Pain in left knee: Secondary | ICD-10-CM
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
## 2016-01-29 ENCOUNTER — Encounter: Payer: Medicare Other | Admitting: Physical Therapy

## 2016-01-30 ENCOUNTER — Ambulatory Visit: Payer: Medicare Other | Admitting: Physical Therapy

## 2016-01-30 ENCOUNTER — Encounter: Payer: Self-pay | Admitting: Physical Therapy

## 2016-01-30 DIAGNOSIS — M25562 Pain in left knee: Secondary | ICD-10-CM | POA: Diagnosis not present

## 2016-01-30 DIAGNOSIS — M25551 Pain in right hip: Secondary | ICD-10-CM | POA: Diagnosis not present

## 2016-01-30 DIAGNOSIS — G8929 Other chronic pain: Secondary | ICD-10-CM
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
## 2016-02-06 ENCOUNTER — Ambulatory Visit: Payer: Medicare Other | Admitting: Physical Therapy

## 2016-02-12 ENCOUNTER — Ambulatory Visit: Payer: Medicare Other | Attending: Orthopaedic Surgery | Admitting: Physical Therapy

## 2016-02-13 ENCOUNTER — Ambulatory Visit: Payer: Medicare Other | Admitting: *Deleted

## 2016-02-20 ENCOUNTER — Encounter: Payer: Self-pay | Admitting: Family

## 2016-02-20 ENCOUNTER — Encounter (INDEPENDENT_AMBULATORY_CARE_PROVIDER_SITE_OTHER): Payer: Self-pay

## 2016-02-20 ENCOUNTER — Ambulatory Visit: Payer: Medicare Other | Admitting: Physical Therapy

## 2016-02-20 ENCOUNTER — Ambulatory Visit (INDEPENDENT_AMBULATORY_CARE_PROVIDER_SITE_OTHER): Payer: Medicare Other | Admitting: Family

## 2016-02-20 VITALS — BP 124/84 | HR 79 | Temp 97.1°F

## 2016-02-20 DIAGNOSIS — F411 Generalized anxiety disorder: Secondary | ICD-10-CM | POA: Diagnosis not present

## 2016-02-20 MED ORDER — ESCITALOPRAM OXALATE 20 MG PO TABS: 20.0000 mg | ORAL_TABLET | Freq: Every day | ORAL | 1 refills | Status: DC

## 2016-02-20 MED ORDER — MIRABEGRON ER 50 MG PO TB24: 50.0000 mg | ORAL_TABLET | Freq: Every day | ORAL | 0 refills | Status: DC

## 2016-02-20 NOTE — Patient Instructions (Signed)
Generalized Anxiety Disorder Generalized anxiety disorder (GAD) is a mental disorder. It interferes with life functions, including relationships, work, and school. GAD is different from normal anxiety, which everyone experiences at some point in their lives in response to specific life events and activities. Normal anxiety actually helps us prepare for and get through these life events and activities. Normal anxiety goes away after the event or activity is over.  GAD causes anxiety that is not necessarily related to specific events or activities. It also causes excess anxiety in proportion to specific events or activities. The anxiety associated with GAD is also difficult to control. GAD can vary from mild to severe. People with severe GAD can have intense waves of anxiety with physical symptoms (panic attacks).  SYMPTOMS The anxiety and worry associated with GAD are difficult to control. This anxiety and worry are related to many life events and activities and also occur more days than not for 6 months or longer. People with GAD also have three or more of the following symptoms (one or more in children):  Restlessness.   Fatigue.  Difficulty concentrating.   Irritability.  Muscle tension.  Difficulty sleeping or unsatisfying sleep. DIAGNOSIS GAD is diagnosed through an assessment by your health care provider. Your health care provider will ask you questions aboutyour mood,physical symptoms, and events in your life. Your health care provider may ask you about your medical history and use of alcohol or drugs, including prescription medicines. Your health care provider may also do a physical exam and blood tests. Certain medical conditions and the use of certain substances can cause symptoms similar to those associated with GAD. Your health care provider may refer you to a mental health specialist for further evaluation. TREATMENT The following therapies are usually used to treat GAD:    Medication. Antidepressant medication usually is prescribed for long-term daily control. Antianxiety medicines may be added in severe cases, especially when panic attacks occur.   Talk therapy (psychotherapy). Certain types of talk therapy can be helpful in treating GAD by providing support, education, and guidance. A form of talk therapy called cognitive behavioral therapy can teach you healthy ways to think about and react to daily life events and activities.  Stress managementtechniques. These include yoga, meditation, and exercise and can be very helpful when they are practiced regularly. A mental health specialist can help determine which treatment is best for you. Some people see improvement with one therapy. However, other people require a combination of therapies. This information is not intended to replace advice given to you by your health care provider. Make sure you discuss any questions you have with your health care provider. Document Released: 07/18/2012 Document Revised: 04/13/2014 Document Reviewed: 07/18/2012 Elsevier Interactive Patient Education  2017 [employer]vier [employer].  

## 2016-02-20 NOTE — Progress Notes (Signed)
   Subjective:    Patient ID: Kim [name], female    DOB: 04-Apr-1976, 40 y.o.   MRN: [id number]  Pt presents to the office today to recheck GAD. Pt was placed on lexapro 10 mg.  Anxiety  Presents for follow-up visit. Symptoms include decreased concentration, irritability, nervous/anxious behavior and palpitations. Patient reports no excessive worry, panic or suicidal ideas. Symptoms occur occasionally. The quality of sleep is fair.        Review of Systems  Constitutional: Positive for irritability.  Cardiovascular: Positive for palpitations.  Psychiatric/Behavioral: Positive for decreased concentration. Negative for suicidal ideas. The patient is nervous/anxious.   All other systems reviewed and are negative.      Objective:   Physical Exam  Constitutional: She is oriented to person, place, and time. She appears well-developed and well-nourished. No distress.  HENT:  Head: Normocephalic.  Cardiovascular: Normal rate, regular rhythm, normal heart sounds and intact distal pulses.   No murmur heard. Pulmonary/Chest: Effort normal and breath sounds normal. No respiratory distress. She has no wheezes.  Musculoskeletal:  PT in wheelchair   Neurological: She is alert and oriented to person, place, and time.  Skin: Skin is warm and dry.  Psychiatric: She has a normal mood and affect. Her behavior is normal. Judgment and thought content normal.  Vitals reviewed.     BP 124/84   Pulse 79   Temp 97.1 F (36.2 C) (Oral)      Assessment & Plan:  1. GAD (generalized anxiety disorder) -Stress management discussed -Lexapro increased to 20 mg from 10 mg RTO in 4 weeks - escitalopram (LEXAPRO) 20 MG tablet; Take 1 tablet (20 mg total) by mouth daily.  Dispense: 90 tablet; Refill: [place], FNP

## 2016-02-25 ENCOUNTER — Ambulatory Visit: Payer: Medicare Other | Admitting: Physical Therapy

## 2016-03-02 DIAGNOSIS — M25551 Pain in right hip: Secondary | ICD-10-CM | POA: Diagnosis not present

## 2016-03-02 DIAGNOSIS — M130 Polyarthritis, unspecified: Secondary | ICD-10-CM | POA: Diagnosis not present

## 2016-03-02 DIAGNOSIS — M545 Low back pain: Secondary | ICD-10-CM | POA: Diagnosis not present

## 2016-03-02 DIAGNOSIS — Z79899 Other long term (current) drug therapy: Secondary | ICD-10-CM | POA: Diagnosis not present

## 2016-03-02 DIAGNOSIS — M25561 Pain in right knee: Secondary | ICD-10-CM | POA: Diagnosis not present

## 2016-03-02 DIAGNOSIS — G8921 Chronic pain due to trauma: Secondary | ICD-10-CM | POA: Diagnosis not present

## 2016-03-02 DIAGNOSIS — M25562 Pain in left knee: Secondary | ICD-10-CM | POA: Diagnosis not present

## 2016-03-02 DIAGNOSIS — M25552 Pain in left hip: Secondary | ICD-10-CM | POA: Diagnosis not present

## 2016-03-02 DIAGNOSIS — M623 Immobility syndrome (paraplegic): Secondary | ICD-10-CM | POA: Diagnosis not present

## 2016-03-03 ENCOUNTER — Encounter: Payer: Self-pay | Admitting: Adult Health

## 2016-03-03 ENCOUNTER — Ambulatory Visit (INDEPENDENT_AMBULATORY_CARE_PROVIDER_SITE_OTHER): Payer: Medicare Other | Admitting: Adult Health

## 2016-03-03 ENCOUNTER — Other Ambulatory Visit (HOSPITAL_COMMUNITY)
Admission: RE | Admit: 2016-03-03 | Discharge: 2016-03-03 | Disposition: A | Discharge status: Home / Self Care | Payer: Medicare Other | Source: Ambulatory Visit | Attending: Adult Health | Admitting: Adult Health

## 2016-03-03 VITALS — BP 118/80 | HR 84 | Ht 66.0 in

## 2016-03-03 DIAGNOSIS — Z780 Asymptomatic menopausal state: Secondary | ICD-10-CM | POA: Insufficient documentation

## 2016-03-03 DIAGNOSIS — Z1151 Encounter for screening for human papillomavirus (HPV): Secondary | ICD-10-CM | POA: Insufficient documentation

## 2016-03-03 DIAGNOSIS — Z124 Encounter for screening for malignant neoplasm of cervix: Secondary | ICD-10-CM | POA: Insufficient documentation

## 2016-03-03 DIAGNOSIS — Z113 Encounter for screening for infections with a predominantly sexual mode of transmission: Secondary | ICD-10-CM | POA: Diagnosis not present

## 2016-03-03 DIAGNOSIS — N852 Hypertrophy of uterus: Secondary | ICD-10-CM | POA: Diagnosis not present

## 2016-03-03 DIAGNOSIS — Z01419 Encounter for gynecological examination (general) (routine) without abnormal findings: Secondary | ICD-10-CM | POA: Diagnosis not present

## 2016-03-03 NOTE — Patient Instructions (Signed)
Physical in 1 year, pap in 3 If normal  Mammogram at 42  Labs with PCP

## 2016-03-03 NOTE — Progress Notes (Signed)
Patient ID: Kim [name], female   DOB: 05-20-1975, 40 y.o.   MRN: [id number] History of Present Illness: Kim [name] is a 40 year old white female in for well woman gyn exam and pap.Has been about 8 years since last pap.She says she had blood drawn at "[facility]" and is postmenopausal, no period in 2 years. PCP is [facility], NP.   Current Medications, Allergies, Past Medical History, Past Surgical History, Family History and Social History were reviewed in [employer] record.     Review of Systems:  Patient denies any headaches, hearing loss, fatigue, blurred vision, shortness of breath, chest pain, abdominal pain, problems with bowel movements, or intercourse(not having sex. No joint pain or mood swings.Has OAB.   Physical Exam:BP 118/80 (BP Location: Left Arm, Patient Position: Sitting, Cuff Size: Normal)   Pulse 84   Ht 5\' 6"  (1.676 m)   LMP 11/24/2012 Comment: OTHER General:  Well developed, well nourished, no acute distress Skin:  Warm and dry Neck:  Midline trachea, normal thyroid, good ROM, no lymphadenopathy Lungs; Clear to auscultation bilaterally Breast:  No dominant palpable mass, retraction, or nipple discharge Cardiovascular: Regular rate and rhythm Abdomen:  Soft, non tender, no hepatosplenomegaly Pelvic:  External genitalia is normal in appearance, no lesions.  The vagina is normal in appearance. Urethra has no lesions or masses. The cervix is poorly seen, pap with HPV and GC/CHL.  Uterus is felt to be enlarged.  No adnexal masses or tenderness noted.Bladder is non tender, no masses felt. Extremities/musculoskeletal:  No swelling or varicosities noted, no cyanosis,is in wheel chair from spinal injury and has paralysis of legs Psych:  No mood changes, alert and cooperative,seems happy PHQ 2 score 0.  Impression: 1. Encounter for routine gynecological examination with Papanicolaou smear of cervix   2. Routine cervical smear    3. Enlarged uterus   4. Postmenopausal       Plan: Return in 1 week for GYN [place] Physical in 1 year, pap in 3 if normal Mammogram at 40 Labs with PCP

## 2016-03-04 LAB — CYTOLOGY - PAP
CHLAMYDIA, DNA PROBE: NEGATIVE
DIAGNOSIS: NEGATIVE
HPV (WINDOPATH): NOT DETECTED
Neisseria Gonorrhea: NEGATIVE

## 2016-03-11 ENCOUNTER — Other Ambulatory Visit: Payer: Medicare Other

## 2016-04-01 DIAGNOSIS — G8921 Chronic pain due to trauma: Secondary | ICD-10-CM | POA: Diagnosis not present

## 2016-04-01 DIAGNOSIS — M545 Low back pain: Secondary | ICD-10-CM | POA: Diagnosis not present

## 2016-04-01 DIAGNOSIS — M25552 Pain in left hip: Secondary | ICD-10-CM | POA: Diagnosis not present

## 2016-04-01 DIAGNOSIS — M130 Polyarthritis, unspecified: Secondary | ICD-10-CM | POA: Diagnosis not present

## 2016-04-01 DIAGNOSIS — M25551 Pain in right hip: Secondary | ICD-10-CM | POA: Diagnosis not present

## 2016-04-01 DIAGNOSIS — M623 Immobility syndrome (paraplegic): Secondary | ICD-10-CM | POA: Diagnosis not present

## 2016-04-02 ENCOUNTER — Ambulatory Visit: Payer: Medicare Other | Admitting: Family

## 2016-04-07 ENCOUNTER — Encounter: Payer: Self-pay | Admitting: Family

## 2016-04-27 ENCOUNTER — Telehealth: Payer: Self-pay | Admitting: Family Medicine

## 2016-05-06 NOTE — Telephone Encounter (Signed)
Faxed RX today.

## 2016-05-29 ENCOUNTER — Encounter: Payer: Self-pay | Admitting: Family

## 2016-05-29 ENCOUNTER — Ambulatory Visit (INDEPENDENT_AMBULATORY_CARE_PROVIDER_SITE_OTHER): Payer: Medicare Other | Admitting: Family

## 2016-05-29 VITALS — BP 125/70 | HR 82 | Temp 97.8°F

## 2016-05-29 DIAGNOSIS — M7989 Other specified soft tissue disorders: Secondary | ICD-10-CM | POA: Diagnosis not present

## 2016-05-29 NOTE — Progress Notes (Signed)
   Subjective:    Patient ID: Kim [name], female    DOB: 11-Apr-1975, 41 y.o.   MRN: [id number]  HPI Pt presents to the office today with right foot and leg swollen. Pt states she noticed the increase in swelling about 3-4 weeks ago, but it has not improved. Pt states about 4 days ago she noticed "red" spots on her foot about 4 days. Pt has cauda equina spinal cord injury. Pt states she has been in the bed "a lot" trying to get the swelling down, but it has not improved. Pt denies any pain or SOB at this time.    Review of Systems  Cardiovascular: Positive for leg swelling (right leg).  All other systems reviewed and are negative.      Objective:   Physical Exam  Constitutional: She is oriented to person, place, and time. She appears well-developed and well-nourished.  Cardiovascular: Normal rate, regular rhythm, normal heart sounds and intact distal pulses.   Pulmonary/Chest: Effort normal and breath sounds normal.  Musculoskeletal: She exhibits edema.  Right leg swollen 2+, cool to touch, R leg > L leg,   Neurological: She is alert and oriented to person, place, and time.    LMP 11/24/2012 Comment: OTHER       Assessment & Plan:  1. Leg swelling _-Doppler pending to rule out DVT Keep elevated Low salt diet - US Venous Img Lower Unilateral Right; Future  [name], FNP

## 2016-05-29 NOTE — Patient Instructions (Signed)
Deep Vein Thrombosis A deep vein thrombosis (DVT) is a blood clot (thrombus) that usually occurs in a deep, larger vein of the lower leg or the pelvis, or in an upper extremity such as the arm. These are dangerous and can lead to serious and even life-threatening complications if the clot travels to the lungs. A DVT can damage the valves in your leg veins so that instead of flowing upward, the blood pools in the lower leg. This is called post-thrombotic syndrome, and it can result in pain, swelling, discoloration, and sores on the leg. What are the causes? A DVT is caused by the formation of a blood clot in your leg, pelvis, or arm. Usually, several things contribute to the formation of blood clots. A clot may develop when:  Your blood flow slows down.  Your vein becomes damaged in some way.  You have a condition that makes your blood clot more easily.  What increases the risk? A DVT is more likely to develop in:  People who are older, especially over 60 years of age.  People who are overweight (obese).  People who sit or lie still for a long time, such as during long-distance travel (over 4 hours), bed rest, hospitalization, or during recovery from certain medical conditions like a stroke.  People who do not engage in much physical activity (sedentary lifestyle).  People who have chronic breathing disorders.  People who have a personal or family history of blood clots or blood clotting disease.  People who have peripheral vascular disease (PVD), diabetes, or some types of cancer.  People who have heart disease, especially if the person had a recent heart attack or has congestive heart failure.  People who have neurological diseases that affect the legs (leg paresis).  People who have had a traumatic injury, such as breaking a hip or leg.  People who have recently had major or lengthy surgery, especially on the hip, knee, or abdomen.  People who have had a central line placed  inside a large vein.  People who take medicines that contain the hormone estrogen. These include birth control pills and hormone replacement therapy.  Pregnancy or during childbirth or the postpartum period.  Long plane flights (over 8 hours).  What are the signs or symptoms?  Symptoms of a DVT can include:  Swelling of your leg or arm, especially if one side is much worse.  Warmth and redness of your leg or arm, especially if one side is much worse.  Pain in your arm or leg. If the clot is in your leg, symptoms may be more noticeable or worse when you stand or walk.  A feeling of pins and needles, if the clot is in the arm.  The symptoms of a DVT that has traveled to the lungs (pulmonary embolism, PE) usually start suddenly and include:  Shortness of breath while active or at rest.  Coughing or coughing up blood or blood-tinged mucus.  Chest pain that is often worse with deep breaths.  Rapid or irregular heartbeat.  Feeling light-headed or dizzy.  Fainting.  Feeling anxious.  Sweating.  There may also be pain and swelling in a leg if that is where the blood clot started. These symptoms may represent a serious problem that is an emergency. Do not wait to see if the symptoms will go away. Get medical help right away. Call your local emergency services (911 in the [place] Do not drive yourself to the hospital. How is this diagnosed? Your health   care provider will take a medical history and perform a physical exam. You may also have other tests, including:  Blood tests to assess the clotting properties of your blood.  Imaging tests, such as CT, ultrasound, MRI, X-ray, and other tests to see if you have clots anywhere in your body.  How is this treated? After a DVT is identified, it can be treated. The type of treatment that you receive depends on many factors, such as the cause of your DVT, your risk for bleeding or developing more clots, and other medical conditions that  you have. Sometimes, a combination of treatments is necessary. Treatment options may be combined and include:  Monitoring the blood clot with ultrasound.  Taking medicines by mouth, such as newer blood thinners (anticoagulants), thrombolytics, or warfarin.  Taking anticoagulant medicine by injection or through an IV tube.  Wearing compression stockings or using different types ofdevices.  Surgery (rare) to remove the blood clot or to place a filter in your abdomen to stop the blood clot from traveling to your lungs.  Treatments for a DVT are often divided into immediate treatment and long-term treatment (up to 3 months after DVT). You can work with your health care provider to choose the treatment program that is best for you. Follow these instructions at home: If you are taking a newer oral anticoagulant:  Take the medicine every single day at the same time each day.  Understand what foods and drugs interact with this medicine.  Understand that there are no regular blood tests required when using this medicine.  Understand the side effects of this medicine, including excessive bruising or bleeding. Ask your health care provider or pharmacist about other possible side effects. If you are taking warfarin:  Understand how to take warfarin and know which foods can affect how warfarin works in your body.  Understand that it is dangerous to take too much or too little warfarin. Too much warfarin increases the risk of bleeding. Too little warfarin continues to allow the risk for blood clots.  Follow your PT and INR blood testing schedule. The PT and INR results allow your health care provider to adjust your dose of warfarin. It is very important that you have your PT and INR tested as often as told by your health care provider.  Avoid major changes in your diet, or tell your health care provider before you change your diet. Arrange a visit with a registered dietitian to answer your  questions. Many foods, especially foods that are high in vitamin K, can interfere with warfarin and affect the PT and INR results. Eat a consistent amount of foods that are high in vitamin K, such as: ? Spinach, kale, broccoli, cabbage, collard greens, turnip greens, Brussels sprouts, peas, cauliflower, seaweed, and parsley. ? Beef liver and pork liver. ? Green tea. ? Soybean oil.  Tell your health care provider about any and all medicines, vitamins, and supplements that you take, including aspirin and other over-the-counter anti-inflammatory medicines. Be especially cautious with aspirin and anti-inflammatory medicines. Do not take those before you ask your health care provider if it is safe to do so. This is important because many medicines can interfere with warfarin and affect the PT and INR results.  Do not start or stop taking any over-the-counter or prescription medicine unless your health care provider or pharmacist tells you to do so. If you take warfarin, you will also need to do these things:  Hold pressure over cuts for longer than   usual.  Tell your dentist and other health care providers that you are taking warfarin before you have any procedures in which bleeding may occur.  Avoid alcohol or drink very small amounts. Tell your health care provider if you change your alcohol intake.  Do not use tobacco products, including cigarettes, chewing tobacco, and e-cigarettes. If you need help quitting, ask your health care provider.  Avoid contact sports.  General instructions  Take over-the-counter and prescription medicines only as told by your health care provider. Anticoagulant medicines can have side effects, including easy bruising and difficulty stopping bleeding. If you are prescribed an anticoagulant, you will also need to do these things: ? Hold pressure over cuts for longer than usual. ? Tell your dentist and other health care providers that you are taking anticoagulants  before you have any procedures in which bleeding may occur. ? Avoid contact sports.  Wear a medical alert bracelet or carry a medical alert card that says you have had a PE.  Ask your health care provider how soon you can go back to your normal activities. Stay active to prevent new blood clots from forming.  Make sure to exercise while traveling or when you have been sitting or standing for a long period of time. It is very important to exercise. Exercise your legs by walking or by tightening and relaxing your leg muscles often. Take frequent walks.  Wear compression stockings as told by your health care provider to help prevent more blood clots from forming.  Do not use tobacco products, including cigarettes, chewing tobacco, and e-cigarettes. If you need help quitting, ask your health care provider.  Keep all follow-up appointments with your health care provider. This is important. How is this prevented? Take these actions to decrease your risk of developing another DVT:  Exercise regularly. For at least 30 minutes every day, engage in: ? Activity that involves moving your arms and legs. ? Activity that encourages good blood flow through your body by increasing your heart rate.  Exercise your arms and legs every hour during long-distance travel (over 4 hours). Drink plenty of water and avoid drinking alcohol while traveling.  Avoid sitting or lying in bed for long periods of time without moving your legs.  Maintain a weight that is appropriate for your height. Ask your health care provider what weight is healthy for you.  If you are a woman who is over 35 years of age, avoid unnecessary use of medicines that contain estrogen. These include birth control pills.  Do not smoke, especially if you take estrogen medicines. If you need help quitting, ask your health care provider.  If you are hospitalized, prevention measures may include:  Early walking after surgery, as soon as your  health care provider says that it is safe.  Receiving anticoagulants to prevent blood clots.If you cannot take anticoagulants, other options may be available, such as wearing compression stockings or using different types of devices.  Get help right away if:  You have new or increased pain, swelling, or redness in an arm or leg.  You have numbness or tingling in an arm or leg.  You have shortness of breath while active or at rest.  You have chest pain.  You have a rapid or irregular heartbeat.  You feel light-headed or dizzy.  You cough up blood.  You notice blood in your vomit, bowel movement, or urine. These symptoms may represent a serious problem that is an emergency. Do not wait to see   if the symptoms will go away. Get medical help right away. Call your local emergency services (911 in the [place] Do not drive yourself to the hospital. This information is not intended to replace advice given to you by your health care provider. Make sure you discuss any questions you have with your health care provider. Document Released: 03/23/2005 Document Revised: 08/29/2015 Document Reviewed: 07/18/2014 Elsevier Interactive Patient Education  2017 [employer]  

## 2016-05-30 ENCOUNTER — Ambulatory Visit (HOSPITAL_COMMUNITY)
Admission: RE | Admit: 2016-05-30 | Discharge: 2016-05-30 | Disposition: A | Discharge status: Home / Self Care | Payer: Medicare Other | Source: Ambulatory Visit | Attending: Family | Admitting: Family

## 2016-05-30 DIAGNOSIS — M7989 Other specified soft tissue disorders: Secondary | ICD-10-CM | POA: Diagnosis not present

## 2016-06-01 DIAGNOSIS — E559 Vitamin D deficiency, unspecified: Secondary | ICD-10-CM | POA: Diagnosis not present

## 2016-06-01 DIAGNOSIS — Z471 Aftercare following joint replacement surgery: Secondary | ICD-10-CM | POA: Diagnosis not present

## 2016-06-01 DIAGNOSIS — Z96649 Presence of unspecified artificial hip joint: Secondary | ICD-10-CM | POA: Insufficient documentation

## 2016-06-01 DIAGNOSIS — Z9889 Other specified postprocedural states: Secondary | ICD-10-CM | POA: Diagnosis not present

## 2016-06-01 DIAGNOSIS — Z993 Dependence on wheelchair: Secondary | ICD-10-CM | POA: Diagnosis not present

## 2016-06-01 DIAGNOSIS — M810 Age-related osteoporosis without current pathological fracture: Secondary | ICD-10-CM | POA: Diagnosis not present

## 2016-06-01 DIAGNOSIS — Q6589 Other specified congenital deformities of hip: Secondary | ICD-10-CM | POA: Diagnosis not present

## 2016-06-01 DIAGNOSIS — F17210 Nicotine dependence, cigarettes, uncomplicated: Secondary | ICD-10-CM | POA: Diagnosis not present

## 2016-06-01 DIAGNOSIS — T84038A Mechanical loosening of other internal prosthetic joint, initial encounter: Secondary | ICD-10-CM | POA: Diagnosis not present

## 2016-06-01 DIAGNOSIS — M7061 Trochanteric bursitis, right hip: Secondary | ICD-10-CM | POA: Diagnosis not present

## 2016-06-01 DIAGNOSIS — M25751 Osteophyte, right hip: Secondary | ICD-10-CM | POA: Diagnosis not present

## 2016-06-01 DIAGNOSIS — T84030A Mechanical loosening of internal right hip prosthetic joint, initial encounter: Secondary | ICD-10-CM | POA: Diagnosis not present

## 2016-06-01 DIAGNOSIS — Z96641 Presence of right artificial hip joint: Secondary | ICD-10-CM | POA: Diagnosis not present

## 2016-06-04 DIAGNOSIS — F17210 Nicotine dependence, cigarettes, uncomplicated: Secondary | ICD-10-CM | POA: Diagnosis not present

## 2016-06-04 DIAGNOSIS — T8484XD Pain due to internal orthopedic prosthetic devices, implants and grafts, subsequent encounter: Secondary | ICD-10-CM | POA: Diagnosis not present

## 2016-06-04 DIAGNOSIS — Z9889 Other specified postprocedural states: Secondary | ICD-10-CM | POA: Diagnosis not present

## 2016-06-04 DIAGNOSIS — Z96641 Presence of right artificial hip joint: Secondary | ICD-10-CM | POA: Diagnosis not present

## 2016-07-06 ENCOUNTER — Ambulatory Visit (INDEPENDENT_AMBULATORY_CARE_PROVIDER_SITE_OTHER): Payer: Medicare Other

## 2016-07-06 DIAGNOSIS — N852 Hypertrophy of uterus: Secondary | ICD-10-CM

## 2016-07-06 NOTE — Progress Notes (Addendum)
PELVIC US TA/TV: homogeneous anteverted uterus,wnl,normal ov's bilat,EEC 2.6 mm,no free fluid,no pain during ultrasound,limited ultrasound,pt has paralysis of lower extremities and is unable to empty bladder.

## 2016-07-24 DIAGNOSIS — M25551 Pain in right hip: Secondary | ICD-10-CM | POA: Diagnosis not present

## 2016-09-24 ENCOUNTER — Ambulatory Visit: Payer: Medicare Other | Admitting: Adult Health

## 2016-10-21 ENCOUNTER — Ambulatory Visit: Payer: Medicare Other | Admitting: Obstetrics and Gynecology

## 2016-10-23 ENCOUNTER — Ambulatory Visit: Payer: Medicare Other | Admitting: Family

## 2016-10-26 ENCOUNTER — Encounter: Payer: Self-pay | Admitting: Family

## 2016-10-27 DIAGNOSIS — Z79899 Other long term (current) drug therapy: Secondary | ICD-10-CM | POA: Diagnosis not present

## 2016-10-27 DIAGNOSIS — G8222 Paraplegia, incomplete: Secondary | ICD-10-CM | POA: Diagnosis not present

## 2016-10-27 DIAGNOSIS — T8484XA Pain due to internal orthopedic prosthetic devices, implants and grafts, initial encounter: Secondary | ICD-10-CM | POA: Diagnosis not present

## 2016-10-27 DIAGNOSIS — D62 Acute posthemorrhagic anemia: Secondary | ICD-10-CM | POA: Diagnosis not present

## 2016-10-27 DIAGNOSIS — Z79891 Long term (current) use of opiate analgesic: Secondary | ICD-10-CM | POA: Diagnosis not present

## 2016-10-27 DIAGNOSIS — Z993 Dependence on wheelchair: Secondary | ICD-10-CM | POA: Diagnosis not present

## 2016-10-27 DIAGNOSIS — F17210 Nicotine dependence, cigarettes, uncomplicated: Secondary | ICD-10-CM | POA: Diagnosis not present

## 2016-11-03 ENCOUNTER — Ambulatory Visit: Payer: Medicare Other | Admitting: Adult Health

## 2016-11-05 DIAGNOSIS — F17290 Nicotine dependence, other tobacco product, uncomplicated: Secondary | ICD-10-CM | POA: Diagnosis not present

## 2016-11-05 DIAGNOSIS — T8484XD Pain due to internal orthopedic prosthetic devices, implants and grafts, subsequent encounter: Secondary | ICD-10-CM | POA: Diagnosis not present

## 2016-11-05 DIAGNOSIS — M1612 Unilateral primary osteoarthritis, left hip: Secondary | ICD-10-CM | POA: Diagnosis not present

## 2016-11-05 DIAGNOSIS — Q6589 Other specified congenital deformities of hip: Secondary | ICD-10-CM | POA: Diagnosis not present

## 2016-11-05 DIAGNOSIS — F17210 Nicotine dependence, cigarettes, uncomplicated: Secondary | ICD-10-CM | POA: Diagnosis not present

## 2016-11-05 DIAGNOSIS — Z96641 Presence of right artificial hip joint: Secondary | ICD-10-CM | POA: Diagnosis not present

## 2016-11-05 DIAGNOSIS — T84030D Mechanical loosening of internal right hip prosthetic joint, subsequent encounter: Secondary | ICD-10-CM | POA: Diagnosis not present

## 2016-11-05 DIAGNOSIS — Z471 Aftercare following joint replacement surgery: Secondary | ICD-10-CM | POA: Diagnosis not present

## 2016-11-09 ENCOUNTER — Ambulatory Visit: Payer: Medicare Other | Admitting: Adult Health

## 2016-11-23 ENCOUNTER — Encounter: Payer: Self-pay | Admitting: Adult Health

## 2016-11-23 ENCOUNTER — Ambulatory Visit (INDEPENDENT_AMBULATORY_CARE_PROVIDER_SITE_OTHER): Payer: Medicare Other | Admitting: Adult Health

## 2016-11-23 VITALS — BP 118/80 | HR 86

## 2016-11-23 DIAGNOSIS — N950 Postmenopausal bleeding: Secondary | ICD-10-CM | POA: Diagnosis not present

## 2016-11-23 NOTE — Progress Notes (Signed)
Subjective:     Patient ID: Kim [name], female   DOB: 08-24-1975, 41 y.o.   MRN: [id number]  HPI Kim [name] is a 41 year old white female in complaining of PMB.She has not had period in about 2-3 years and her bone doctor told her she was PM, and then about 3-4 weeks ago had red bleeding for about 3 days, then stopped, no pain.She is having right hip surgery in am.  Review of Systems PMB   Reviewed past medical,surgical, social and family history. Reviewed medications and allergies.     Objective:   Physical Exam BP 118/80 (BP Location: Left Arm, Patient Position: Sitting, Cuff Size: Normal)   Pulse 86   LMP 11/24/2012   Skin warm and dry.Pelvic: external genitalia is normal in appearance no lesions, vagina: scant discharge without odor,urethra has no lesions or masses noted, cervix:smooth and bulbous, uterus: normal size, shape and contour, non tender, no masses felt, adnexa: no masses or tenderness noted. Bladder is non tender and no masses felt.    Discussed getting [place] to assess uterine lining, and if thickened, may have to get endometrial biopsy to rule out endometrial cancer.Will get [place] after hip surgery.Pt aware if +cancer will get hysterectomy. Face time 15 minutes with 50% counseling as above.  Assessment:     PMB    Plan:     Return in 2 weeks for GYN [place] to assess uterine lining

## 2016-11-23 NOTE — Patient Instructions (Signed)
Return in 2 weeks for [place]

## 2016-11-24 DIAGNOSIS — Z993 Dependence on wheelchair: Secondary | ICD-10-CM | POA: Diagnosis not present

## 2016-11-24 DIAGNOSIS — Z79899 Other long term (current) drug therapy: Secondary | ICD-10-CM | POA: Diagnosis not present

## 2016-11-24 DIAGNOSIS — D62 Acute posthemorrhagic anemia: Secondary | ICD-10-CM | POA: Diagnosis not present

## 2016-11-24 DIAGNOSIS — M1612 Unilateral primary osteoarthritis, left hip: Secondary | ICD-10-CM | POA: Diagnosis not present

## 2016-11-24 DIAGNOSIS — Z720 Tobacco use: Secondary | ICD-10-CM | POA: Diagnosis not present

## 2016-11-24 DIAGNOSIS — T84030A Mechanical loosening of internal right hip prosthetic joint, initial encounter: Secondary | ICD-10-CM | POA: Diagnosis not present

## 2016-11-24 DIAGNOSIS — Z96641 Presence of right artificial hip joint: Secondary | ICD-10-CM | POA: Diagnosis not present

## 2016-11-24 DIAGNOSIS — T84090A Other mechanical complication of internal right hip prosthesis, initial encounter: Secondary | ICD-10-CM | POA: Diagnosis not present

## 2016-11-24 DIAGNOSIS — F17210 Nicotine dependence, cigarettes, uncomplicated: Secondary | ICD-10-CM | POA: Diagnosis present

## 2016-11-24 DIAGNOSIS — G8918 Other acute postprocedural pain: Secondary | ICD-10-CM | POA: Diagnosis not present

## 2016-11-24 DIAGNOSIS — M898X5 Other specified disorders of bone, thigh: Secondary | ICD-10-CM | POA: Diagnosis not present

## 2016-11-24 DIAGNOSIS — G8929 Other chronic pain: Secondary | ICD-10-CM | POA: Diagnosis not present

## 2016-11-24 DIAGNOSIS — T8484XA Pain due to internal orthopedic prosthetic devices, implants and grafts, initial encounter: Secondary | ICD-10-CM | POA: Diagnosis present

## 2016-11-24 DIAGNOSIS — Z79891 Long term (current) use of opiate analgesic: Secondary | ICD-10-CM | POA: Diagnosis not present

## 2016-11-24 DIAGNOSIS — G8222 Paraplegia, incomplete: Secondary | ICD-10-CM | POA: Diagnosis present

## 2016-11-24 DIAGNOSIS — M25551 Pain in right hip: Secondary | ICD-10-CM | POA: Diagnosis not present

## 2016-12-08 ENCOUNTER — Other Ambulatory Visit: Payer: Medicare Other

## 2016-12-11 DIAGNOSIS — Z7982 Long term (current) use of aspirin: Secondary | ICD-10-CM | POA: Diagnosis not present

## 2016-12-11 DIAGNOSIS — Z96641 Presence of right artificial hip joint: Secondary | ICD-10-CM | POA: Diagnosis not present

## 2016-12-11 DIAGNOSIS — Z4789 Encounter for other orthopedic aftercare: Secondary | ICD-10-CM | POA: Diagnosis not present

## 2016-12-31 ENCOUNTER — Other Ambulatory Visit: Payer: Medicare Other

## 2017-01-04 ENCOUNTER — Ambulatory Visit (INDEPENDENT_AMBULATORY_CARE_PROVIDER_SITE_OTHER): Payer: Medicare Other

## 2017-01-04 DIAGNOSIS — N950 Postmenopausal bleeding: Secondary | ICD-10-CM

## 2017-01-04 NOTE — Progress Notes (Signed)
PELVIC US TA/TV:homogeneous anteverted uterus,wnl,EEC 2.3 mm,normal ovaries bialt,no pain during ultrasound,ovaries appear mobile

## 2017-01-06 ENCOUNTER — Telehealth: Payer: Self-pay | Admitting: Adult Health

## 2017-01-06 NOTE — Telephone Encounter (Signed)
Pt aware that US showed normal uterus and ovaries and that EEC 2.3 mm, which is good.

## 2017-01-13 ENCOUNTER — Ambulatory Visit: Payer: Medicare Other | Admitting: Adult Health

## 2017-02-01 ENCOUNTER — Ambulatory Visit: Payer: Medicare Other

## 2017-02-23 ENCOUNTER — Ambulatory Visit: Payer: Medicare Other

## 2017-02-24 ENCOUNTER — Ambulatory Visit: Payer: Medicare Other | Admitting: Adult Health

## 2017-03-02 ENCOUNTER — Ambulatory Visit: Payer: Medicare Other

## 2017-03-05 ENCOUNTER — Ambulatory Visit (INDEPENDENT_AMBULATORY_CARE_PROVIDER_SITE_OTHER): Payer: Medicare Other

## 2017-03-05 DIAGNOSIS — Z23 Encounter for immunization: Secondary | ICD-10-CM

## 2017-04-12 ENCOUNTER — Ambulatory Visit: Payer: Medicare Other | Admitting: Adult Health

## 2017-04-13 ENCOUNTER — Ambulatory Visit: Payer: Medicare Other | Admitting: Adult Health

## 2017-04-21 ENCOUNTER — Ambulatory Visit: Payer: Medicare Other | Admitting: Adult Health

## 2017-05-03 ENCOUNTER — Ambulatory Visit: Payer: Medicare Other | Admitting: Obstetrics and Gynecology

## 2017-05-10 ENCOUNTER — Ambulatory Visit: Payer: Medicare Other | Admitting: Obstetrics and Gynecology

## 2017-05-24 ENCOUNTER — Ambulatory Visit: Payer: Medicare Other | Admitting: Obstetrics and Gynecology

## 2017-08-23 ENCOUNTER — Ambulatory Visit: Payer: Medicare Other | Admitting: Obstetrics and Gynecology

## 2017-10-12 ENCOUNTER — Ambulatory Visit: Payer: Medicare Other | Admitting: Family

## 2017-10-14 ENCOUNTER — Ambulatory Visit (INDEPENDENT_AMBULATORY_CARE_PROVIDER_SITE_OTHER): Payer: Medicare Other | Admitting: Family

## 2017-10-14 ENCOUNTER — Encounter: Payer: Self-pay | Admitting: Family

## 2017-10-14 VITALS — BP 116/77 | HR 84 | Temp 98.2°F

## 2017-10-14 DIAGNOSIS — M549 Dorsalgia, unspecified: Secondary | ICD-10-CM

## 2017-10-14 DIAGNOSIS — G8929 Other chronic pain: Secondary | ICD-10-CM

## 2017-10-14 DIAGNOSIS — M25552 Pain in left hip: Secondary | ICD-10-CM | POA: Diagnosis not present

## 2017-10-14 DIAGNOSIS — S343XXS Injury of cauda equina, sequela: Secondary | ICD-10-CM | POA: Diagnosis not present

## 2017-10-14 DIAGNOSIS — M25562 Pain in left knee: Secondary | ICD-10-CM | POA: Diagnosis not present

## 2017-10-14 DIAGNOSIS — N3281 Overactive bladder: Secondary | ICD-10-CM | POA: Diagnosis not present

## 2017-10-14 DIAGNOSIS — M25551 Pain in right hip: Secondary | ICD-10-CM

## 2017-10-14 DIAGNOSIS — M25561 Pain in right knee: Secondary | ICD-10-CM

## 2017-10-14 DIAGNOSIS — R32 Unspecified urinary incontinence: Secondary | ICD-10-CM | POA: Diagnosis not present

## 2017-10-14 DIAGNOSIS — M1712 Unilateral primary osteoarthritis, left knee: Secondary | ICD-10-CM | POA: Diagnosis not present

## 2017-10-14 MED ORDER — OXYBUTYNIN CHLORIDE ER 10 MG PO TB24: 10.0000 mg | ORAL_TABLET | Freq: Every day | ORAL | 1 refills | Status: DC

## 2017-10-14 NOTE — Patient Instructions (Signed)

## 2017-10-14 NOTE — Progress Notes (Signed)
Subjective:    Patient ID: Kim [name], female    DOB: [birth year], 42 y.o.   MRN: [id number]  Chief Complaint  Patient presents with  . Referral    wants physical therapy  . discuss bladder medicines    HPI Pt presents to the office today for referral to physical therapy for back pain. PT has spinal cord injury and is wheelchair bound. She has had three different hip surgeries on her right hip. She reports her pain in her back in 10 out 10 that is constant that is worse at night. She reports she can not stretch her left leg.   She states she is having OAB. She reports she use to see an Urologists in the past, but has been years. She had been on oxybutrin in the past and seemed to work well. She states she has not taken any medications recently, but is now having incontinent at night and urinary frequency during the day. She reports when she does have to urinate she has urgency or she will have incontinence.    Review of Systems  Cardiovascular: Positive for leg swelling.  Genitourinary: Positive for frequency and urgency.  All other systems reviewed and are negative.      Objective:   Physical Exam  Constitutional: She is oriented to person, place, and time. She appears well-developed and well-nourished. No distress.  HENT:  Head: Normocephalic and atraumatic.  Right Ear: External ear normal.  Left Ear: External ear normal.  Mouth/Throat: Oropharynx is clear and moist.  Eyes: Pupils are equal, round, and reactive to light.  Neck: Normal range of motion. Neck supple. No thyromegaly present.  Cardiovascular: Normal rate, regular rhythm, normal heart sounds and intact distal pulses.  No murmur heard. Pulmonary/Chest: Effort normal and breath sounds normal. No respiratory distress. She has no wheezes.  Abdominal: Soft. Bowel sounds are normal. She exhibits no distension. There is no tenderness.  Musculoskeletal: She exhibits edema (trace BLE). She exhibits no tenderness or  deformity.  Pt wheelchair bound, lower extremity weakness    Neurological: She is alert and oriented to person, place, and time. She has normal reflexes. No cranial nerve deficit.  Skin: Skin is warm and dry.  Psychiatric: She has a normal mood and affect. Her behavior is normal. Judgment and thought content normal.  Vitals reviewed.     BP 116/77   Pulse 84   Temp 98.2 F (36.8 C) (Oral)   LMP 11/24/2012      Assessment & Plan:  Kim [name] comes in today with chief complaint of Referral (wants physical therapy) and discuss bladder medicines   Diagnosis and orders addressed:  1. OAB (overactive bladder) -Pt started on Oxybutynin today  Will do referral to Urologists today  - Ambulatory referral to Urology - oxybutynin (DITROPAN-XL) 10 MG 24 hr tablet; Take 1 tablet (10 mg total) by mouth at bedtime.  Dispense: 90 tablet; Refill: 1  2. Urinary incontinence, unspecified type - Ambulatory referral to Urology - oxybutynin (DITROPAN-XL) 10 MG 24 hr tablet; Take 1 tablet (10 mg total) by mouth at bedtime.  Dispense: 90 tablet; Refill: 1  3. Chronic back pain, unspecified back location, unspecified back pain laterality - Ambulatory referral to Physical Therapy  4. Cauda equina spinal cord injury, sequela - Ambulatory referral to Physical Therapy  5. Hip pain, bilateral - Ambulatory referral to Physical Therapy  6. Arthralgia of both lower legs - Ambulatory referral to Physical Therapy  7. Osteoarthritis of left knee,  unspecified osteoarthritis type - Ambulatory referral to Physical Therapy   Labs pending Health Maintenance reviewed Diet and exercise encouraged  Follow up plan: 6 months    [name], FNP

## 2017-10-26 ENCOUNTER — Ambulatory Visit: Payer: Medicare Other | Attending: Physical Therapy | Admitting: Physical Therapy

## 2017-11-11 ENCOUNTER — Ambulatory Visit: Payer: Medicare Other | Attending: Family | Admitting: Physical Therapy

## 2018-01-13 ENCOUNTER — Encounter: Payer: Medicare Other | Admitting: *Deleted

## 2018-01-28 ENCOUNTER — Ambulatory Visit (INDEPENDENT_AMBULATORY_CARE_PROVIDER_SITE_OTHER): Payer: Medicare Other

## 2018-01-28 DIAGNOSIS — Z23 Encounter for immunization: Secondary | ICD-10-CM | POA: Diagnosis not present

## 2018-02-07 DIAGNOSIS — Z993 Dependence on wheelchair: Secondary | ICD-10-CM | POA: Diagnosis not present

## 2018-02-07 DIAGNOSIS — M810 Age-related osteoporosis without current pathological fracture: Secondary | ICD-10-CM | POA: Diagnosis not present

## 2018-02-07 DIAGNOSIS — F17200 Nicotine dependence, unspecified, uncomplicated: Secondary | ICD-10-CM | POA: Diagnosis not present

## 2018-02-07 DIAGNOSIS — Z96641 Presence of right artificial hip joint: Secondary | ICD-10-CM | POA: Diagnosis not present

## 2018-02-07 DIAGNOSIS — Z9181 History of falling: Secondary | ICD-10-CM | POA: Diagnosis not present

## 2018-02-09 DIAGNOSIS — M810 Age-related osteoporosis without current pathological fracture: Secondary | ICD-10-CM

## 2018-02-09 DIAGNOSIS — Z9181 History of falling: Secondary | ICD-10-CM | POA: Insufficient documentation

## 2018-02-09 HISTORY — DX: Age-related osteoporosis without current pathological fracture: M81.0

## 2018-04-13 ENCOUNTER — Other Ambulatory Visit: Payer: Medicare Other | Admitting: Adult Health

## 2018-04-18 ENCOUNTER — Encounter: Payer: Medicare Other | Admitting: *Deleted

## 2018-04-28 ENCOUNTER — Ambulatory Visit: Payer: Medicare Other | Admitting: Adult Health

## 2018-06-06 ENCOUNTER — Other Ambulatory Visit: Payer: Self-pay | Admitting: Adult Health

## 2018-06-06 ENCOUNTER — Encounter: Payer: Self-pay | Admitting: Adult Health

## 2018-08-18 ENCOUNTER — Ambulatory Visit: Payer: Medicare Other | Admitting: Family

## 2018-08-18 ENCOUNTER — Other Ambulatory Visit: Payer: Self-pay

## 2018-12-02 ENCOUNTER — Telehealth: Payer: Self-pay | Admitting: Family

## 2018-12-02 NOTE — Telephone Encounter (Signed)
Done

## 2018-12-02 NOTE — Telephone Encounter (Signed)
[name] from the [facility] needs [place] to write a Rx for 2 bed wedges and Fax them to [facility]

## 2019-01-02 ENCOUNTER — Telehealth: Payer: Self-pay | Admitting: Family

## 2019-01-02 DIAGNOSIS — M4716 Other spondylosis with myelopathy, lumbar region: Secondary | ICD-10-CM

## 2019-01-02 DIAGNOSIS — M25551 Pain in right hip: Secondary | ICD-10-CM

## 2019-01-02 DIAGNOSIS — S343XXS Injury of cauda equina, sequela: Secondary | ICD-10-CM

## 2019-01-02 DIAGNOSIS — M25552 Pain in left hip: Secondary | ICD-10-CM

## 2019-01-02 DIAGNOSIS — M1712 Unilateral primary osteoarthritis, left knee: Secondary | ICD-10-CM

## 2019-01-03 MED ORDER — BED WEDGE MISC: 1.0000 | Freq: Two times a day (BID) | 0 refills | Status: DC

## 2019-01-03 NOTE — Telephone Encounter (Signed)
Rx sent. Can we call and make sure this went? It did not print.

## 2019-01-03 NOTE — Telephone Encounter (Signed)
Please review and advise.

## 2019-01-04 NOTE — Telephone Encounter (Signed)
Aware.  Items are ready.

## 2019-01-10 ENCOUNTER — Other Ambulatory Visit: Payer: Self-pay

## 2019-01-10 DIAGNOSIS — Z20822 Contact with and (suspected) exposure to covid-19: Secondary | ICD-10-CM

## 2019-01-12 LAB — NOVEL CORONAVIRUS, NAA: SARS-CoV-2, NAA: NOT DETECTED

## 2019-02-20 ENCOUNTER — Telehealth: Payer: Self-pay | Admitting: Family

## 2019-02-20 DIAGNOSIS — S343XXS Injury of cauda equina, sequela: Secondary | ICD-10-CM

## 2019-02-20 MED ORDER — TRANSFER BENCH MISC: 1.0000 | 1 refills | Status: DC | PRN

## 2019-02-20 NOTE — Telephone Encounter (Signed)
Prescription sent to pharmacy.

## 2019-03-17 ENCOUNTER — Encounter: Payer: Self-pay | Admitting: Family

## 2019-03-17 ENCOUNTER — Ambulatory Visit (INDEPENDENT_AMBULATORY_CARE_PROVIDER_SITE_OTHER): Payer: Medicare Other | Admitting: Family

## 2019-03-17 DIAGNOSIS — L89899 Pressure ulcer of other site, unspecified stage: Secondary | ICD-10-CM

## 2019-03-17 DIAGNOSIS — S343XXS Injury of cauda equina, sequela: Secondary | ICD-10-CM | POA: Diagnosis not present

## 2019-03-17 DIAGNOSIS — L89219 Pressure ulcer of right hip, unspecified stage: Secondary | ICD-10-CM | POA: Diagnosis not present

## 2019-03-17 NOTE — Progress Notes (Signed)
   Virtual Visit via telephone Note Due to COVID-19 pandemic this visit was conducted virtually. This visit type was conducted due to national recommendations for restrictions regarding the COVID-19 Pandemic (e.g. social distancing, sheltering in place) in an effort to limit this patient's exposure and mitigate transmission in our community. All issues noted in this document were discussed and addressed.  A physical exam was not performed with this format.  I connected with Kim [name] on 03/17/19 at 1:35 pm by telephone and verified that I am speaking with the correct person using two identifiers. Kim [name] is currently located at home and no one is currently with her during visit. The provider, [name], FNP is located in their office at time of visit.  I discussed the limitations, risks, security and privacy concerns of performing an evaluation and management service by telephone and the availability of in person appointments. I also discussed with the patient that there may be a patient responsible charge related to this service. The patient expressed understanding and agreed to proceed.   History and Present Illness:  HPI Pt calls the office today with a pressure injury on her right hip the size of a fifty cent piece and right lower leg that is a size of a quarter. She noticed this several weeks ago. She reports the area has improved since she has been trying to keep pressure off them. She had a spinal cord injury and is wheelchair bound.   She is requesting podiatry referral to have her toenails cut and to keep an eye on her feet.   Review of Systems  Genitourinary: Negative for flank pain.  Skin:       Pressure injury  All other systems reviewed and are negative.    Observations/Objective: No SOB or distress noted   Assessment and Plan: 1. Pressure injury of skin of right hip, unspecified injury stage  2. Pressure ulcer of left leg  3. Cauda equina spinal  cord injury, sequela  - Ambulatory referral to Podiatry  Apply Mepilex dressing to pressure area Report any s/s of infection Continue to turn every 2 hours Referral to Podiatry placed   I discussed the assessment and treatment plan with the patient. The patient was provided an opportunity to ask questions and all were answered. The patient agreed with the plan and demonstrated an understanding of the instructions.   The patient was advised to call back or seek an in-person evaluation if the symptoms worsen or if the condition fails to improve as anticipated.  The above assessment and management plan was discussed with the patient. The patient verbalized understanding of and has agreed to the management plan. Patient is aware to call the clinic if symptoms persist or worsen. Patient is aware when to return to the clinic for a follow-up visit. Patient educated on when it is appropriate to go to the emergency department.   Time call ended: 1:50 pm    I provided 15 minutes of non-face-to-face time during this encounter.    [name], FNP

## 2019-04-28 ENCOUNTER — Other Ambulatory Visit: Payer: Self-pay | Admitting: Family

## 2019-11-10 ENCOUNTER — Telehealth: Payer: Self-pay | Admitting: Family

## 2019-11-10 NOTE — Telephone Encounter (Signed)
error 

## 2019-12-26 ENCOUNTER — Ambulatory Visit: Payer: Medicare Other | Admitting: Family

## 2019-12-26 ENCOUNTER — Encounter: Payer: Self-pay | Admitting: Family

## 2020-04-12 ENCOUNTER — Ambulatory Visit (INDEPENDENT_AMBULATORY_CARE_PROVIDER_SITE_OTHER): Payer: Medicare Other | Admitting: Nurse Practitioner

## 2020-04-12 ENCOUNTER — Other Ambulatory Visit: Payer: Self-pay

## 2020-04-12 ENCOUNTER — Encounter: Payer: Self-pay | Admitting: Nurse Practitioner

## 2020-04-12 VITALS — BP 93/62 | HR 103 | Ht 66.0 in

## 2020-04-12 DIAGNOSIS — S91309A Unspecified open wound, unspecified foot, initial encounter: Secondary | ICD-10-CM | POA: Insufficient documentation

## 2020-04-12 DIAGNOSIS — S91301A Unspecified open wound, right foot, initial encounter: Secondary | ICD-10-CM | POA: Diagnosis not present

## 2020-04-12 MED ORDER — DOXYCYCLINE HYCLATE 100 MG PO TABS
100.0000 mg | ORAL_TABLET | Freq: Two times a day (BID) | ORAL | 0 refills | Status: DC
Start: 2020-04-12 — End: 2020-05-16

## 2020-04-12 NOTE — Patient Instructions (Signed)
Wound Care, Adult Taking care of your wound properly can help to prevent pain, infection, and scarring. It can also help your wound to heal more quickly. How to care for your wound Wound care      Follow instructions from your health care provider about how to take care of your wound. Make sure you: ? Wash your hands with soap and water before you change the bandage (dressing). If soap and water are not available, use hand sanitizer. ? Change your dressing as told by your health care provider. ? Leave stitches (sutures), skin glue, or adhesive strips in place. These skin closures may need to stay in place for 2 weeks or longer. If adhesive strip edges start to loosen and curl up, you may trim the loose edges. Do not remove adhesive strips completely unless your health care provider tells you to do that.  Check your wound area every day for signs of infection. Check for: ? Redness, swelling, or pain. ? Fluid or blood. ? Warmth. ? Pus or a bad smell.  Ask your health care provider if you should clean the wound with mild soap and water. Doing this may include: ? Using a clean towel to pat the wound dry after cleaning it. Do not rub or scrub the wound. ? Applying a cream or ointment. Do this only as told by your health care provider. ? Covering the incision with a clean dressing.  Ask your health care provider when you can leave the wound uncovered.  Keep the dressing dry until your health care provider says it can be removed. Do not take baths, swim, use a hot tub, or do anything that would put the wound underwater until your health care provider approves. Ask your health care provider if you can take showers. You may only be allowed to take sponge baths. Medicines   If you were prescribed an antibiotic medicine, cream, or ointment, take or use the antibiotic as told by your health care provider. Do not stop taking or using the antibiotic even if your condition improves.  Take  over-the-counter and prescription medicines only as told by your health care provider. If you were prescribed pain medicine, take it 30 or more minutes before you do any wound care or as told by your health care provider. General instructions  Return to your normal activities as told by your health care provider. Ask your health care provider what activities are safe.  Do not scratch or pick at the wound.  Do not use any products that contain nicotine or tobacco, such as cigarettes and e-cigarettes. These may delay wound healing. If you need help quitting, ask your health care provider.  Keep all follow-up visits as told by your health care provider. This is important.  Eat a diet that includes protein, vitamin A, vitamin C, and other nutrient-rich foods to help the wound heal. ? Foods rich in protein include meat, dairy, beans, nuts, and other sources. ? Foods rich in vitamin A include carrots and dark green, leafy vegetables. ? Foods rich in vitamin C include citrus, tomatoes, and other fruits and vegetables. ? Nutrient-rich foods have protein, carbohydrates, fat, vitamins, or minerals. Eat a variety of healthy foods including vegetables, fruits, and whole grains. Contact a health care provider if:  You received a tetanus shot and you have swelling, severe pain, redness, or bleeding at the injection site.  Your pain is not controlled with medicine.  You have redness, swelling, or pain around the wound.    You have fluid or blood coming from the wound.  Your wound feels warm to the touch.  You have pus or a bad smell coming from the wound.  You have a fever or chills.  You are nauseous or you vomit.  You are dizzy. Get help right away if:  You have a red streak going away from your wound.  The edges of the wound open up and separate.  Your wound is bleeding, and the bleeding does not stop with gentle pressure.  You have a rash.  You faint.  You have trouble  breathing. Summary  Always wash your hands with soap and water before changing your bandage (dressing).  To help with healing, eat foods that are rich in protein, vitamin A, vitamin C, and other nutrients.  Check your wound every day for signs of infection. Contact your health care provider if you suspect that your wound is infected. This information is not intended to replace advice given to you by your health care provider. Make sure you discuss any questions you have with your health care provider. Document Revised: 07/11/2018 Document Reviewed: 10/08/2015 Elsevier Patient Education  2020 Elsevier Inc.  

## 2020-04-12 NOTE — Assessment & Plan Note (Signed)
This is a new problem for patient in the last 14 days.  Wound is nonhealing left heel.  Wound is necrotic, has a foul odor, and sloughing.  An area around the heel wound has eschar.  Wound is not tunneling and measures 3 cm x 5 cm, 1 cm depth.  Provided wound care in clinic: Washed with saline, iodine, wrapped with para foam and none adhesive gauze.   Patient tolerated procedure  Referral to wound clinic completed  Follow-up in 2 to 4 weeks.
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
## 2020-04-16 ENCOUNTER — Telehealth: Payer: Self-pay

## 2020-04-16 NOTE — Telephone Encounter (Signed)
Patient had a visit with JE on 1/7 and was given Doxy.  States she started having abdominal pain this morning.  Thinks it is from doxy.  Please advise

## 2020-04-16 NOTE — Telephone Encounter (Signed)
Side effect of doxycycline can include stomach pain, cramping, or headache please advise patient to discontinue medication and follow-up with worsening or unresolved symptoms.

## 2020-04-16 NOTE — Telephone Encounter (Signed)
Pt made aware to d/c doxy. She knows to call if symptoms become worse. She has a follow up here in one month and is waiting on an appt with wound care.

## 2020-04-30 ENCOUNTER — Telehealth: Payer: Self-pay

## 2020-05-01 ENCOUNTER — Telehealth: Payer: Self-pay

## 2020-05-01 DIAGNOSIS — S91309A Unspecified open wound, unspecified foot, initial encounter: Secondary | ICD-10-CM

## 2020-05-01 DIAGNOSIS — S343XXS Injury of cauda equina, sequela: Secondary | ICD-10-CM

## 2020-05-02 NOTE — Telephone Encounter (Signed)
Please advise on the possible need for 24 hour care for wounds. Call is from [facility] case manager, you can write a script for the heel protectors as well as the wheelchair cushion which can be faxed to the [facility] case manager

## 2020-05-02 NOTE — Telephone Encounter (Signed)
Kim [name] can you help address this patients needs from [facility] , I did an acute visit on her once. thanks

## 2020-05-03 MED ORDER — WHEELCHAIR CUSHION MISC: 1.0000 "application " | Freq: Every day | 1 refills | Status: DC

## 2020-05-03 NOTE — Addendum Note (Signed)
Addended by: Ladean Raya on: 05/03/2020 01:48 PM   Modules accepted: Orders

## 2020-05-03 NOTE — Telephone Encounter (Signed)
DME faxed

## 2020-05-03 NOTE — Telephone Encounter (Signed)
Left detailed message need to know where to fax and fax number to where to fax it.

## 2020-05-03 NOTE — Telephone Encounter (Signed)
I did not see patient but after reviewing chart patient may benefit from 24 hour care.   Please fax rx for wheelchair cushion and heel protector.

## 2020-05-10 ENCOUNTER — Ambulatory Visit: Payer: Medicare Other | Admitting: Family

## 2020-05-13 ENCOUNTER — Telehealth: Payer: Self-pay

## 2020-05-13 NOTE — Telephone Encounter (Signed)
LMOVM to have pt call in to make appt with Christy to look at other wounds that will qualifier her for the wheelchair cusion.

## 2020-05-16 ENCOUNTER — Encounter: Payer: Self-pay | Admitting: Family

## 2020-05-16 ENCOUNTER — Ambulatory Visit (INDEPENDENT_AMBULATORY_CARE_PROVIDER_SITE_OTHER): Payer: Medicare Other

## 2020-05-16 ENCOUNTER — Telehealth: Payer: Self-pay

## 2020-05-16 DIAGNOSIS — Z0000 Encounter for general adult medical examination without abnormal findings: Secondary | ICD-10-CM | POA: Diagnosis not present
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
## 2020-05-16 NOTE — Telephone Encounter (Signed)
Patient is calling to check on her referral to wound clinic, said she had not heard anything.  I see in your office visit note with her from 04/12/20 that you planned to send her.  I do not see a referral made though.  Please advise.

## 2020-05-17 ENCOUNTER — Other Ambulatory Visit: Payer: Self-pay | Admitting: Nurse Practitioner

## 2020-05-17 DIAGNOSIS — S91309A Unspecified open wound, unspecified foot, initial encounter: Secondary | ICD-10-CM

## 2020-05-17 NOTE — Telephone Encounter (Signed)
Referral completed to podiatry

## 2020-05-17 NOTE — Telephone Encounter (Signed)
Left detailed message.   

## 2020-06-13 ENCOUNTER — Ambulatory Visit (INDEPENDENT_AMBULATORY_CARE_PROVIDER_SITE_OTHER): Payer: Medicare Other | Admitting: Podiatry

## 2020-06-13 ENCOUNTER — Other Ambulatory Visit: Payer: Self-pay

## 2020-06-13 DIAGNOSIS — S343XXS Injury of cauda equina, sequela: Secondary | ICD-10-CM

## 2020-06-13 DIAGNOSIS — L89622 Pressure ulcer of left heel, stage 2: Secondary | ICD-10-CM

## 2020-06-13 DIAGNOSIS — R609 Edema, unspecified: Secondary | ICD-10-CM

## 2020-06-13 DIAGNOSIS — L89892 Pressure ulcer of other site, stage 2: Secondary | ICD-10-CM

## 2020-06-13 DIAGNOSIS — B353 Tinea pedis: Secondary | ICD-10-CM

## 2020-06-13 DIAGNOSIS — Z720 Tobacco use: Secondary | ICD-10-CM

## 2020-06-13 MED ORDER — KETOCONAZOLE 2 % EX CREA: 1.0000 "application " | TOPICAL_CREAM | Freq: Every day | CUTANEOUS | 2 refills | Status: DC

## 2020-06-13 MED ORDER — MUPIROCIN 2 % EX OINT: 1.0000 "application " | TOPICAL_OINTMENT | Freq: Two times a day (BID) | CUTANEOUS | 2 refills | Status: DC

## 2020-06-14 ENCOUNTER — Encounter: Payer: Self-pay | Admitting: Podiatry

## 2020-06-14 ENCOUNTER — Telehealth: Payer: Self-pay | Admitting: Podiatry

## 2020-06-14 MED ORDER — AMOXICILLIN-POT CLAVULANATE 875-125 MG PO TABS: 1.0000 | ORAL_TABLET | Freq: Two times a day (BID) | ORAL | 0 refills | Status: AC

## 2020-06-14 NOTE — Telephone Encounter (Signed)
Patient calling to request antibiotic refill. When patient went to the pharmacy, the 2 topical creams were ready but not the antibiotic

## 2020-06-14 NOTE — Progress Notes (Signed)
  Subjective:  Patient ID: Kim [name], female    DOB: [birth year],  MRN: [id number]  Chief Complaint  Patient presents with  . Wound Check    Left foot towards the back of the heel pt has an open area that has been there since January and the right hallux has a spot that she noticed today.    45 y.o. female presents with the above complaint. History confirmed with patient.  She has had ulcerations on the plantar right heel before this was able to the wound care center.  She is paraplegic and uses a wheelchair primarily.  Does not walk much.  She says she does try to offload pressure is much as possible.  She was referred by her PCP and has been managing with antibiotic ointment.  Objective:  Physical Exam: warm, good capillary refill and normal DP and PT pulses.  Absent sensation bilateral lower extremities.  1 out of 5 muscle strength.  Multiple ulcerations including posterior heel 2 cm x 4 cm.  Distal hallux 1 cm x 1 cm, medial hallux 0.5 cm x 0.5 cm.  Fibrinous wound bed, rolled hyperkeratotic edges.  No purulence or cellulitis.  No exposed Achilles tendon the posterior heel.  +3 pitting edema bilateral LE.  Dry scaling rash in moccasin distribution bilaterally      Assessment:   1. Pressure injury of left heel, stage 2 ([facility])   2. Pressure injury of toe of right foot, stage 2 ([facility])   3. Cauda equina spinal cord injury, sequela   4. Peripheral edema   5. Tinea pedis of both feet   6. Tobacco use      Plan:  Patient was evaluated and treated and all questions answered.  Ulcer right posterior heel, right hallux -Reviewed the exam findings with her and discussed the prognosis of the wounds.  I think the primary issue that these have not healed is both pressure as well as she has significant peripheral pitting edema.  Think she requires advanced wound care and compression wrap therapy.  I have sent her a her a referral to the [facility]  with Dr. Dellia Nims. -Rx for Augmentin 2 weeks sent to pharmacy -Debridement as below. -Dressed with Iodosorb, DSD. -Continue off-loading with surgical shoe.  Procedure: Excisional Debridement of Wound Rationale: Removal of non-viable soft tissue from the wound to promote healing.  Anesthesia: none Pre-Debridement Wound Measurements: 2 cm x 4 cm.  Distal hallux 1 cm x 1 cm, medial hallux 0.5 cm x 0.5 cm Post-Debridement Wound Measurements: 2 cm x 4 cm.  Distal hallux 1 cm x 1 cm, medial hallux 0.5 cm x 0.5 cm  Type of Debridement: Sharp Excisional Tissue Removed: Non-viable soft tissue Depth of Debridement: subcutaneous tissue. Technique: Sharp excisional debridement to bleeding, viable wound base.  Dressing: Dry, sterile, compression dressing. Disposition: Patient tolerated procedure well. Patient to return in 1 week for follow-up.  Return in about 3 weeks (around 07/04/2020).        Return in about 3 weeks (around 07/04/2020).

## 2020-06-21 NOTE — Telephone Encounter (Signed)
Completed in visit encounter

## 2020-07-04 ENCOUNTER — Ambulatory Visit: Payer: Medicare Other | Admitting: Podiatry

## 2020-07-19 ENCOUNTER — Encounter (HOSPITAL_BASED_OUTPATIENT_CLINIC_OR_DEPARTMENT_OTHER): Payer: Medicare Other | Admitting: Internal Medicine

## 2020-08-08 ENCOUNTER — Other Ambulatory Visit: Payer: Medicare Other | Admitting: Obstetrics & Gynecology

## 2020-11-28 ENCOUNTER — Ambulatory Visit: Payer: Medicare Other | Admitting: Family

## 2020-12-02 ENCOUNTER — Ambulatory Visit: Payer: Medicare Other | Admitting: Family

## 2020-12-11 ENCOUNTER — Other Ambulatory Visit: Payer: Medicare Other | Admitting: Adult Health

## 2020-12-31 ENCOUNTER — Ambulatory Visit: Payer: Medicare Other | Admitting: Family

## 2021-01-08 DIAGNOSIS — Z03818 Encounter for observation for suspected exposure to other biological agents ruled out: Secondary | ICD-10-CM | POA: Diagnosis not present

## 2021-01-10 ENCOUNTER — Encounter: Payer: Self-pay | Admitting: Family

## 2021-01-10 ENCOUNTER — Ambulatory Visit (INDEPENDENT_AMBULATORY_CARE_PROVIDER_SITE_OTHER): Payer: Medicare Other | Admitting: Family

## 2021-01-10 DIAGNOSIS — S343XXS Injury of cauda equina, sequela: Secondary | ICD-10-CM

## 2021-01-10 DIAGNOSIS — S343XXD Injury of cauda equina, subsequent encounter: Secondary | ICD-10-CM

## 2021-01-10 DIAGNOSIS — L89216 Pressure-induced deep tissue damage of right hip: Secondary | ICD-10-CM | POA: Diagnosis not present

## 2021-01-10 MED ORDER — CEPHALEXIN 500 MG PO CAPS: 500.0000 mg | ORAL_CAPSULE | Freq: Three times a day (TID) | ORAL | 0 refills | Status: DC

## 2021-01-10 NOTE — Progress Notes (Signed)
Virtual Visit  Note Due to COVID-19 pandemic this visit was conducted virtually. This visit type was conducted due to national recommendations for restrictions regarding the COVID-19 Pandemic (e.g. social distancing, sheltering in place) in an effort to limit this patient's exposure and mitigate transmission in our community. All issues noted in this document were discussed and addressed.  A physical exam was not performed with this format.  I connected with Kim [name] on 01/10/21 at 8:42 AM by telephone and verified that I am speaking with the correct person using two identifiers. Kim [name] is currently located at home and no one is currently with her during visit. The provider, [name], FNP is located in their office at time of visit.  I discussed the limitations, risks, security and privacy concerns of performing an evaluation and management service by telephone and the availability of in person appointments. I also discussed with the patient that there may be a patient responsible charge related to this service. The patient expressed understanding and agreed to proceed.   History and Present Illness:  Pt calls the office today for wound on right lateral hip that started three weeks ago. She is wheelchair bound because of cauda equina spinal cord injury.  She states her phone is "messed up" and can not do a video visit today. She reports she does not have a car right now and can not go a wound care clinic that is out of town.   She states her current wheelchair is broken and has a metal bar that is "rubbing" her hip. She states she is going to make an appointment to be seen for a new wheelchair.   She reports the wound is approx "three fingers across". She has been using OTC creams that is helping. However, has noticed an odor. Reports mild aching pain 4 out 10. Denies any fever.  Wound Check She was originally treated more than 14 days ago. Her temperature was unmeasured  prior to arrival.    Review of Systems  Skin:        wound  All other systems reviewed and are negative.   Observations/Objective: No SOB or distress noted  Assessment and Plan: 1. Cauda equina spinal cord injury, sequela  - AMB referral to wound care center - cephALEXin (KEFLEX) 500 MG capsule; Take 1 capsule (500 mg total) by mouth 3 (three) times daily.  Dispense: 30 capsule; Refill: 0  2. Pressure injury of deep tissue of right hip - AMB referral to wound care center - cephALEXin (KEFLEX) 500 MG capsule; Take 1 capsule (500 mg total) by mouth 3 (three) times daily.  Dispense: 30 capsule; Refill: 0  Pt states she is able to have [facility] set up for appointments She will make an appointment to be seen in person for new wheelchair asap.  Start Keflex  Wound care referral pending Continue dressing changes BID, report any fevers, increased redness, discharge, or pain.    I discussed the assessment and treatment plan with the patient. The patient was provided an opportunity to ask questions and all were answered. The patient agreed with the plan and demonstrated an understanding of the instructions.   The patient was advised to call back or seek an in-person evaluation if the symptoms worsen or if the condition fails to improve as anticipated.  The above assessment and management plan was discussed with the patient. The patient verbalized understanding of and has agreed to the management plan. Patient is aware to call  the clinic if symptoms persist or worsen. Patient is aware when to return to the clinic for a follow-up visit. Patient educated on when it is appropriate to go to the emergency department.   Time call ended:  8:57 A   I provided 14 minutes of  non face-to-face time during this encounter.    [name], FNP

## 2021-01-13 ENCOUNTER — Telehealth: Payer: Self-pay | Admitting: Family

## 2021-01-14 MED ORDER — SULFAMETHOXAZOLE-TRIMETHOPRIM 800-160 MG PO TABS: 1.0000 | ORAL_TABLET | Freq: Two times a day (BID) | ORAL | 0 refills | Status: DC

## 2021-01-14 NOTE — Telephone Encounter (Signed)
Stop Keflex and start Bactrim. Prescription sent to pharmacy

## 2021-01-14 NOTE — Telephone Encounter (Signed)
Left detailed message on patients voicemail to stop current antibiotic and new one sent to pharmacy

## 2021-01-23 ENCOUNTER — Telehealth: Payer: Self-pay | Admitting: Family

## 2021-01-23 NOTE — Telephone Encounter (Signed)
Pt called to let Alyse Low know that she has an appt to see wound care specialist on 10/25 but says she finished her antibiotic and says wound is not worse but is not better either.   Wants to know if Alyse Low can call her in a different medicine that she has taken in the past for wound care called Augmentin.  Please advise and call patient.

## 2021-01-23 NOTE — Telephone Encounter (Signed)
Pt needs to be seen in person.

## 2021-01-23 NOTE — Telephone Encounter (Signed)
Appt made

## 2021-01-24 ENCOUNTER — Encounter: Payer: Self-pay | Admitting: Family

## 2021-01-24 ENCOUNTER — Ambulatory Visit (INDEPENDENT_AMBULATORY_CARE_PROVIDER_SITE_OTHER): Payer: Medicare Other | Admitting: Family

## 2021-01-24 DIAGNOSIS — S343XXS Injury of cauda equina, sequela: Secondary | ICD-10-CM

## 2021-01-24 DIAGNOSIS — L89219 Pressure ulcer of right hip, unspecified stage: Secondary | ICD-10-CM

## 2021-01-24 MED ORDER — AMOXICILLIN-POT CLAVULANATE 875-125 MG PO TABS: 1.0000 | ORAL_TABLET | Freq: Two times a day (BID) | ORAL | 0 refills | Status: DC

## 2021-01-24 NOTE — Progress Notes (Signed)
Virtual Visit  Note Due to COVID-19 pandemic this visit was conducted virtually. This visit type was conducted due to national recommendations for restrictions regarding the COVID-19 Pandemic (e.g. social distancing, sheltering in place) in an effort to limit this patient's exposure and mitigate transmission in our community. All issues noted in this document were discussed and addressed.  A physical exam was not performed with this format.  I connected with Kim [name] on 01/24/21 at 3:03 pm  by telephone and verified that I am speaking with the correct person using two identifiers. Kim [name] is currently located at home and son  is currently with her during visit. The provider, [name], FNP is located in their office at time of visit.  I discussed the limitations, risks, security and privacy concerns of performing an evaluation and management service by telephone and the availability of in person appointments. I also discussed with the patient that there may be a patient responsible charge related to this service. The patient expressed understanding and agreed to proceed.  Kim [name], Kim [name] are scheduled for a virtual visit with your provider today.    Just as we do with appointments in the office, we must obtain your consent to participate.  Your consent will be active for this visit and any virtual visit you may have with one of our providers in the next 365 days.    If you have a MyChart account, I can also send a copy of this consent to you electronically.  All virtual visits are billed to your insurance company just like a traditional visit in the office.  As this is a virtual visit, video technology does not allow for your provider to perform a traditional examination.  This may limit your provider's ability to fully assess your condition.  If your provider identifies any concerns that need to be evaluated in person or the need to arrange testing such as labs, EKG, etc, we  will make arrangements to do so.    Although advances in technology are sophisticated, we cannot ensure that it will always work on either your end or our end.  If the connection with a video visit is poor, we may have to switch to a telephone visit.  With either a video or telephone visit, we are not always able to ensure that we have a secure connection.   I need to obtain your verbal consent now.   Are you willing to proceed with your visit today?   Kim [name] has provided verbal consent on 01/24/2021 for a virtual visit (video or telephone).   [name], [facility] 01/24/2021  3:07 PM    History and Present Illness:  Pt calls the office today for recurrent wound on right lateral hip that started five weeks ago. She is wheelchair bound because of cauda equina spinal cord injury.  She states her phone is "messed up" and can not do a video visit today. She reports she does not have a car right now and can not go a wound care clinic that is out of town.    She states her current wheelchair is broken and has a metal bar that is "rubbing" her hip. She states she is going to make an appointment to be seen for a new wheelchair. She did not make this since our last appointment. She has appointment with wound care on 01/28/21. She completed Keflex 500 mg TID for 10 days. This was changed because of stomach issues and Bactrim.  She completed this yesterday. She reports no improvement in her wound and is unchanged.    She reports the wound is approx "three fingers across". She has been using OTC creams that is helping. However, has noticed an odor. Reports mild aching, burning pain 10 out 10 when sitting her wheelchair. Her pain is a 0 out 10 when laying in the bed. Denies any fever. Denies any fever and denies any drainage.   Wound Check She was originally treated more than 14 days ago. Her temperature was unmeasured prior to arrival.     ROS   Observations/Objective: Unable to connect to  video, no SOB or distress noted  Assessment and Plan: 1. Pressure injury of skin of right hip, unspecified injury stage - amoxicillin-clavulanate (AUGMENTIN) 875-125 MG tablet; Take 1 tablet by mouth 2 (two) times daily.  Dispense: 14 tablet; Refill: 0  2. Cauda equina spinal cord injury, sequela - amoxicillin-clavulanate (AUGMENTIN) 875-125 MG tablet; Take 1 tablet by mouth 2 (two) times daily.  Dispense: 14 tablet; Refill: 0  Start Augmentin Keep Wound care appointment on Tuesday Discussed she needs to be seen in person for her next appointment  She will call and schedule appt for wheelchair   I discussed the assessment and treatment plan with the patient. The patient was provided an opportunity to ask questions and all were answered. The patient agreed with the plan and demonstrated an understanding of the instructions.   The patient was advised to call back or seek an in-person evaluation if the symptoms worsen or if the condition fails to improve as anticipated.  The above assessment and management plan was discussed with the patient. The patient verbalized understanding of and has agreed to the management plan. Patient is aware to call the clinic if symptoms persist or worsen. Patient is aware when to return to the clinic for a follow-up visit. Patient educated on when it is appropriate to go to the emergency department.   Time call ended:  3:18 pm   I provided 15 minutes of  non face-to-face time during this encounter.    [name], FNP

## 2021-01-28 DIAGNOSIS — L89219 Pressure ulcer of right hip, unspecified stage: Secondary | ICD-10-CM | POA: Diagnosis not present

## 2021-01-28 DIAGNOSIS — S31821A Laceration without foreign body of left buttock, initial encounter: Secondary | ICD-10-CM | POA: Diagnosis not present

## 2021-02-04 ENCOUNTER — Other Ambulatory Visit (HOSPITAL_COMMUNITY): Payer: Self-pay | Admitting: Surgery

## 2021-02-04 ENCOUNTER — Other Ambulatory Visit: Payer: Self-pay | Admitting: Surgery

## 2021-02-04 DIAGNOSIS — L89623 Pressure ulcer of left heel, stage 3: Secondary | ICD-10-CM | POA: Diagnosis not present

## 2021-02-04 DIAGNOSIS — G834 Cauda equina syndrome: Secondary | ICD-10-CM | POA: Diagnosis not present

## 2021-02-04 DIAGNOSIS — L89223 Pressure ulcer of left hip, stage 3: Secondary | ICD-10-CM | POA: Diagnosis not present

## 2021-02-04 DIAGNOSIS — F17210 Nicotine dependence, cigarettes, uncomplicated: Secondary | ICD-10-CM | POA: Diagnosis not present

## 2021-02-04 DIAGNOSIS — L89214 Pressure ulcer of right hip, stage 4: Secondary | ICD-10-CM | POA: Diagnosis not present

## 2021-02-04 DIAGNOSIS — R000 Tachycardia, unspecified: Secondary | ICD-10-CM | POA: Diagnosis not present

## 2021-02-04 DIAGNOSIS — L89313 Pressure ulcer of right buttock, stage 3: Secondary | ICD-10-CM | POA: Diagnosis not present

## 2021-02-04 DIAGNOSIS — Q6589 Other specified congenital deformities of hip: Secondary | ICD-10-CM | POA: Diagnosis not present

## 2021-02-04 DIAGNOSIS — M549 Dorsalgia, unspecified: Secondary | ICD-10-CM | POA: Diagnosis not present

## 2021-02-04 DIAGNOSIS — G8929 Other chronic pain: Secondary | ICD-10-CM | POA: Diagnosis not present

## 2021-02-04 DIAGNOSIS — L89323 Pressure ulcer of left buttock, stage 3: Secondary | ICD-10-CM | POA: Diagnosis not present

## 2021-02-05 ENCOUNTER — Ambulatory Visit (HOSPITAL_COMMUNITY): Admission: RE | Admit: 2021-02-05 | Payer: Medicare Other | Source: Ambulatory Visit

## 2021-02-05 ENCOUNTER — Encounter (HOSPITAL_COMMUNITY): Payer: Self-pay

## 2021-02-05 DIAGNOSIS — Z03818 Encounter for observation for suspected exposure to other biological agents ruled out: Secondary | ICD-10-CM | POA: Diagnosis not present

## 2021-02-07 ENCOUNTER — Ambulatory Visit: Payer: Medicare Other | Admitting: Family

## 2021-02-10 ENCOUNTER — Ambulatory Visit (HOSPITAL_COMMUNITY)
Admission: RE | Admit: 2021-02-10 | Discharge: 2021-02-10 | Disposition: A | Discharge status: Home / Self Care | Payer: Medicare Other | Source: Ambulatory Visit | Attending: Surgery | Admitting: Surgery

## 2021-02-10 ENCOUNTER — Telehealth (INDEPENDENT_AMBULATORY_CARE_PROVIDER_SITE_OTHER): Payer: Medicare Other | Admitting: Family

## 2021-02-10 ENCOUNTER — Other Ambulatory Visit: Payer: Self-pay

## 2021-02-10 ENCOUNTER — Encounter: Payer: Self-pay | Admitting: Family

## 2021-02-10 DIAGNOSIS — F411 Generalized anxiety disorder: Secondary | ICD-10-CM | POA: Diagnosis not present

## 2021-02-10 DIAGNOSIS — L89623 Pressure ulcer of left heel, stage 3: Secondary | ICD-10-CM | POA: Diagnosis not present

## 2021-02-10 DIAGNOSIS — L8944 Pressure ulcer of contiguous site of back, buttock and hip, stage 4: Secondary | ICD-10-CM | POA: Diagnosis not present

## 2021-02-10 DIAGNOSIS — S91309A Unspecified open wound, unspecified foot, initial encounter: Secondary | ICD-10-CM | POA: Diagnosis not present

## 2021-02-10 DIAGNOSIS — S343XXS Injury of cauda equina, sequela: Secondary | ICD-10-CM

## 2021-02-10 DIAGNOSIS — L89624 Pressure ulcer of left heel, stage 4: Secondary | ICD-10-CM | POA: Diagnosis not present

## 2021-02-10 MED ORDER — VITAMIN C 500 MG PO CAPS: 500.0000 mg | ORAL_CAPSULE | Freq: Every day | ORAL | 11 refills | Status: AC

## 2021-02-10 MED ORDER — SOCK AID MISC: 1.0000 | Freq: Every day | 0 refills | Status: DC

## 2021-02-10 MED ORDER — ZINC 50 MG PO CAPS: 50.0000 mg | ORAL_CAPSULE | Freq: Every day | ORAL | 1 refills | Status: AC

## 2021-02-10 MED ORDER — DAKINS (1/2 STRENGTH) 0.25 % EX SOLN
1.0000 | Freq: Once | CUTANEOUS | 11 refills | Status: AC
Start: 2021-02-10 — End: 2021-02-10

## 2021-02-10 MED ORDER — PROMOD PO LIQD: 30.0000 mL | Freq: Two times a day (BID) | ORAL | 11 refills | Status: DC

## 2021-02-10 MED ORDER — ENSURE HIGH PROTEIN PO LIQD: 237.0000 mL | Freq: Three times a day (TID) | ORAL | 11 refills | Status: DC

## 2021-02-10 NOTE — Patient Instructions (Signed)
Pressure Injury °A pressure injury is damage to the skin and underlying tissue that results from pressure being applied to an area of the body. It often affects people who must spend a long time in a bed or chair because of a medical condition. °Pressure injuries usually occur: °Over bony parts of the body, such as the tailbone, shoulders, elbows, hips, heels, spine, ankles, and back of the head. °Under medical devices that make contact with the body, such as respiratory equipment, stockings, tubes, and splints. °Pressure injuries start as reddened areas on the skin and can lead to pain and an open wound. °What are the causes? °This condition is caused by frequent or constant pressure to an area of the body. Decreased blood flow to the skin can eventually cause the skin tissue to die and break down, causing a wound. °What increases the risk? °You are more likely to develop this condition if you: °Are in the hospital or an extended care facility. °Are bedridden or in a wheelchair. °Have an injury or disease that keeps you from: °Moving normally. °Feeling pain or pressure. °Have a condition that: °Makes you sleepy or less alert. °Causes poor blood flow. °Need to wear a medical device. °Have poor control of your bladder or bowel functions (incontinence). °Have poor nutrition (malnutrition). °If you are at risk for pressure injuries, your health care provider may recommend certain types of mattresses, mattress covers, pillows, cushions, or boots to help prevent them. These may include products filled with air, foam, gel, or sand. °What are the signs or symptoms? °Symptoms of this condition depend on the severity of the injury. Symptoms may include: °Red or dark areas of the skin. °Pain, warmth, or a change of skin texture. °Blisters. °An open wound. °How is this diagnosed? °This condition is diagnosed with a medical history and physical exam. You may also have tests, such as: °Blood tests. °Imaging tests. °Blood flow  tests. °Your pressure injury will be staged based on its severity. Staging is based on: °The depth of the tissue injury, including whether there is exposure of muscle, bone, or tendon. °The cause of the pressure injury. °How is this treated? °This condition may be treated by: °Relieving or redistributing pressure on your skin. This includes: °Frequently changing your position. °Avoiding positions that caused the wound or that can make the wound worse. °Using specific bed mattresses, chair cushions, or protective boots. °Moving medical devices from an area of pressure, or placing padding between the skin and the device. °Using foams, creams, or powders to prevent rubbing (friction) on the skin. °Keeping your skin clean and dry. This may include using a skin cleanser or skin barrier as told by your health care provider. °Cleaning your injury and removing any dead tissue from the wound (debridement). °Placing a bandage (dressing) over your injury. °Using medicines for pain or to prevent or treat infection. °Surgery may be needed if other treatments are not working or if your injury is very deep. °Follow these instructions at home: °Wound care °Follow instructions from your health care provider about how to take care of your wound. Make sure you: °Wash your hands with soap and water before and after you change your bandage (dressing). If soap and water are not available, use hand sanitizer. °Change your dressing as told by your health care provider.  °Check your wound every day for signs of infection. Have a caregiver do this for you if you are not able. Check for: °Redness, swelling, or increased pain. °More fluid   or blood. °Warmth. °Pus or a bad smell. °Skin care °Keep your skin clean and dry. Gently pat your skin dry. °Do not rub or massage your skin. °You or a caregiver should check your skin every day for any changes in color or any new blisters or sores (ulcers). °Medicines °Take over-the-counter and prescription  medicines only as told by your health care provider. °If you were prescribed an antibiotic medicine, take or apply it as told by your health care provider. Do not stop using the antibiotic even if your condition improves. °Reducing and redistributing pressure °Do not lie or sit in one position for a long time. Move or change position every 1-2 hours, or as told by your health care provider. °Use pillows or cushions to reduce pressure. Ask your health care provider to recommend cushions or pads for you. °General instructions ° °Eat a healthy diet that includes lots of protein. °Drink enough fluid to keep your urine pale yellow. °Be as active as you can every day. Ask your health care provider to suggest safe exercises or activities. °Do not abuse drugs or alcohol. °Do not use any products that contain nicotine or tobacco, such as cigarettes, e-cigarettes, and chewing tobacco. If you need help quitting, ask your health care provider. °Keep all follow-up visits as told by your health care provider. This is important. °Contact a health care provider if: °You have: °A fever or chills. °Pain that is not helped by medicine. °Any changes in skin color. °New blisters or sores. °Pus or a bad smell coming from your wound. °Redness, swelling, or pain around your wound. °More fluid or blood coming from your wound. °Your wound does not improve after 1-2 weeks of treatment. °Summary °A pressure injury is damage to the skin and underlying tissue that results from pressure being applied to an area of the body. °Do not lie or sit in one position for a long time. Your health care provider may advise you to move or change position every 1-2 hours. °Follow instructions from your health care provider about how to take care of your wound. °Keep all follow-up visits as told by your health care provider. This is important. °This information is not intended to replace advice given to you by your health care provider. Make sure you discuss  any questions you have with your health care provider. °Document Revised: 10/20/2017 Document Reviewed: 10/20/2017 °[employer] Patient Education © 2022 [employer] ° °

## 2021-02-10 NOTE — Progress Notes (Addendum)
Virtual Visit Consent   Kim [name], you are scheduled for a virtual visit with a [facility] provider today.     Just as with appointments in the office, your consent must be obtained to participate.  Your consent will be active for this visit and any virtual visit you may have with one of our providers in the next 365 days.     If you have a MyChart account, a copy of this consent can be sent to you electronically.  All virtual visits are billed to your insurance company just like a traditional visit in the office.    As this is a virtual visit, video technology does not allow for your provider to perform a traditional examination.  This may limit your provider's ability to fully assess your condition.  If your provider identifies any concerns that need to be evaluated in person or the need to arrange testing (such as labs, EKG, etc.), we will make arrangements to do so.     Although advances in technology are sophisticated, we cannot ensure that it will always work on either your end or our end.  If the connection with a video visit is poor, the visit may have to be switched to a telephone visit.  With either a video or telephone visit, we are not always able to ensure that we have a secure connection.     I need to obtain your verbal consent now.   Are you willing to proceed with your visit today?    Kim [name] has provided verbal consent on 02/10/2021 for a virtual visit (video or telephone).   Kim Dun, FNP   Date: 02/10/2021 9:18 AM   Virtual Visit via Video Note   I, Kim [name], connected with  Kim [name]  ([id number], 28-Jun-1975) on 02/10/21 at  8:55 AM EST by a video-enabled telemedicine application and verified that I am speaking with the correct person using two identifiers.  Location: Patient: Virtual Visit Location Patient: Home Provider: Virtual Visit Location Provider: Office/Clinic   I discussed the limitations of evaluation and management by  telemedicine and the availability of in person appointments. The patient expressed understanding and agreed to proceed.    History of Present Illness: Kim [name] is a 45 y.o. who identifies as a female who was assigned female at birth, and is being seen today for chronic follow up. 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 She had a debridement surgery on 02/04/21. She is followed by weekly. They recommend her start ProMod protein liquid and doing BID wet to dry dressing changes. She has hx of cauda equina spinal cord injury and is wheelchair bound. Her current wheelchair is broken and causing the pressure ulcer.   She is followed by Pain Clinic once a month and followed by therapy weekly.    Patient suffers from chronic pain in bilateral hips and backs which is caused by pressure ulcer and cauda spinal cord injury. The hospital bed will alleviated pain by allowing her hips, back, and legs to be positioned in ways not feasible with a normal bed. Pain episodes frequently require frequent and immediate changes in body position which cannot be achieved with a normal bed.   HPI: Anxiety Presents for follow-up visit. Symptoms include excessive worry, irritability, nervous/anxious behavior and restlessness. Symptoms occur most days. The severity of symptoms is moderate.    Arthritis Presents for follow-up visit. She  complains of pain and stiffness. The symptoms have been stable. Affected locations include the right hip and left hip (back). Her pain is at a severity of 6/10.   Problems:  Patient Active Problem List   Diagnosis Date Noted   Pressure injury of contiguous region involving right buttock and hip, stage 4 ([place]) 02/10/2021   Pressure injury of left heel, stage 4 ([place]) 02/10/2021   Non-healing open wound of heel, initial encounter 04/12/2020   Encounter for routine gynecological examination with Papanicolaou smear of cervix 03/03/2016    Enlarged uterus 03/03/2016   Postmenopausal 03/03/2016   GAD (generalized anxiety disorder) 01/21/2016   Osteopenia    Vitamin D deficiency 05/01/2014   Heterotopic ossification of bone 08/02/2013   Hip pain, bilateral 08/02/2013   Osteoarthritis of left knee 08/02/2013   Cauda equina spinal cord injury ([place]) 06/16/2013   KNEE PAIN 05/23/2007   SPONDYLOSIS WITH MYELOPATHY LUMBAR REGION 05/23/2007    Allergies: No Known Allergies Medications:  Current Outpatient Medications:    Ascorbic Acid (VITAMIN C) 500 MG CAPS, Take 500 mg by mouth daily., Disp: 90 capsule, Rfl: 11   cefdinir (OMNICEF) 300 MG capsule, Take by mouth., Disp: , Rfl:    doxycycline (VIBRA-TABS) 100 MG tablet, Take by mouth., Disp: , Rfl:    Nutritional Supplements (ENSURE HIGH PROTEIN) LIQD, Take 237 mLs by mouth 4 (four) times daily - after meals and at bedtime., Disp: 3792 mL, Rfl: 11   sodium hypochlorite (DAKIN'S 1/2 STRENGTH) external solution, Irrigate with 1 application as directed once for 1 dose., Disp: 473 mL, Rfl: 11   cefdinir (OMNICEF) 300 MG capsule, Take 300 mg by mouth 2 (two) times daily., Disp: , Rfl:    clonazePAM (KLONOPIN) 0.5 MG tablet, Take 0.5 mg by mouth 2 (two) times daily., Disp: , Rfl:    Foot Care Products (SOCK AID) MISC, 1 Device by Does not apply route daily., Disp: 1 each, Rfl: 0   gabapentin (NEURONTIN) 300 MG capsule, Take 300 mg by mouth. Takes 1 tab in the am; 1 tab at lunch; 3 tabs at bedtime, Disp: , Rfl:    Misc. Devices (BED WEDGE) MISC, 1 each by Does not apply route 2 (two) times daily., Disp: 2 each, Rfl: 0   Misc. Devices (TRANSFER BENCH) MISC, 1 Device by Does not apply route as needed., Disp: 1 each, Rfl: 1   Misc. Devices [facility] CUSHION) MISC, 1 application by Does not apply route daily., Disp: 1 each, Rfl: 1   Multiple Vitamins-Calcium (ONE-A-DAY WOMENS PO), Take 1 tablet by mouth daily., Disp: , Rfl:    NARCAN 4 MG/0.1ML LIQD nasal spray kit, , Disp: , Rfl:     Nutritional Supplements (PROMOD) LIQD, Take 30 mLs by mouth 2 (two) times daily., Disp: 946 mL, Rfl: 11   SUBOXONE 8-2 MG FILM, 3 (three) times daily. , Disp: , Rfl:    Zinc 50 MG CAPS, Take 1 capsule (50 mg total) by mouth daily., Disp: 90 capsule, Rfl: 1  Observations/Objective: Patient is well-developed, well-nourished in no acute distress.  Resting comfortably  at home.  Head is normocephalic, atraumatic.  No labored breathing.  Speech is clear and coherent with logical content.  Patient is alert and oriented at baseline.  Pt in wheelchair, pressure ulcer on heel and hip dressing intact.   Assessment and Plan: 1. Cauda equina spinal cord injury, sequela - DME Wheelchair electric - Foot Care Products (SOCK AID) MISC; 1 Device by Does not apply route daily.  Dispense: 1 each; Refill: 0 - For home use only DME Hospital bed  2. GAD (generalized anxiety disorder) - DME Wheelchair electric - For home use only DME Hospital bed  3. Non-healing open wound of heel, initial encounter - Nutritional Supplements (PROMOD) LIQD; Take 30 mLs by mouth 2 (two) times daily.  Dispense: 946 mL; Refill: 11 - Zinc 50 MG CAPS; Take 1 capsule (50 mg total) by mouth daily.  Dispense: 90 capsule; Refill: 1 - Ascorbic Acid (VITAMIN C) 500 MG CAPS; Take 500 mg by mouth daily.  Dispense: 90 capsule; Refill: 11 - Nutritional Supplements (ENSURE HIGH PROTEIN) LIQD; Take 237 mLs by mouth 4 (four) times daily - after meals and at bedtime.  Dispense: 3792 mL; Refill: 11 - sodium hypochlorite (DAKIN'S 1/2 STRENGTH) external solution; Irrigate with 1 application as directed once for 1 dose.  Dispense: 473 mL; Refill: 11 - DME Wheelchair electric - Foot Care Products ([place]) MISC; 1 Device by Does not apply route daily.  Dispense: 1 each; Refill: 0 - For home use only DME Hospital bed  4. Pressure injury of left heel, stage 4 ([facility]) - Nutritional Supplements (PROMOD) LIQD; Take 30 mLs by mouth 2 (two) times daily.   Dispense: 946 mL; Refill: 11 - Zinc 50 MG CAPS; Take 1 capsule (50 mg total) by mouth daily.  Dispense: 90 capsule; Refill: 1 - Ascorbic Acid (VITAMIN C) 500 MG CAPS; Take 500 mg by mouth daily.  Dispense: 90 capsule; Refill: 11 - Nutritional Supplements (ENSURE HIGH PROTEIN) LIQD; Take 237 mLs by mouth 4 (four) times daily - after meals and at bedtime.  Dispense: 3792 mL; Refill: 11 - sodium hypochlorite (DAKIN'S 1/2 STRENGTH) external solution; Irrigate with 1 application as directed once for 1 dose.  Dispense: 473 mL; Refill: 11  5. 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  Dispense: 946 mL; Refill: 11 - Zinc 50 MG CAPS; Take 1 capsule (50 mg total) by mouth daily.  Dispense: 90 capsule; Refill: 1 - Ascorbic Acid (VITAMIN C) 500 MG CAPS; Take 500 mg by mouth daily.  Dispense: 90 capsule; Refill: 11 - Nutritional Supplements (ENSURE HIGH PROTEIN) LIQD; Take 237 mLs by mouth 4 (four) times daily - after meals and at bedtime.  Dispense: 3792 mL; Refill: 11 - sodium hypochlorite (DAKIN'S 1/2 STRENGTH) external solution; Irrigate with 1 application as directed once for 1 dose.  Dispense: 473 mL; Refill: 11  Keep follow up with with wound care Continue to work with [facility] to get equipment and items Keep follow up with pain clinic  Continue dressing changes and antibiotics    Follow Up Instructions: I discussed the assessment and treatment plan with the patient. The patient was provided an opportunity to ask questions and all were answered. The patient agreed with the plan and demonstrated an understanding of the instructions.  A copy of instructions were sent to the patient via MyChart unless otherwise noted below.     The patient was advised to call back or seek an in-person evaluation if the symptoms worsen or if the condition fails to improve as anticipated.  Time:  I  spent 41 minutes with the patient via telehealth technology discussing the above problems/concerns and documenting.     Kim Dun, FNP

## 2021-02-11 ENCOUNTER — Ambulatory Visit: Payer: Medicare Other | Admitting: Family

## 2021-02-12 ENCOUNTER — Telehealth: Payer: Self-pay | Admitting: Family

## 2021-02-13 NOTE — Telephone Encounter (Signed)
Faxed order & notes for electric WC to [employer] [employer] mssg sent to Desiree Hane w/ [facility] for hosp bed

## 2021-02-20 ENCOUNTER — Telehealth: Payer: Self-pay | Admitting: Family Medicine

## 2021-02-21 ENCOUNTER — Encounter: Payer: Medicare Other | Admitting: Adult Health

## 2021-02-24 ENCOUNTER — Other Ambulatory Visit: Payer: Self-pay | Admitting: Family

## 2021-02-24 NOTE — Progress Notes (Signed)
Please fax these to her pharmacy. I have added her note from our previous visit to include needed information.   [name], FNP

## 2021-02-24 NOTE — Progress Notes (Signed)
Sent to [facility]

## 2021-03-04 DIAGNOSIS — L89223 Pressure ulcer of left hip, stage 3: Secondary | ICD-10-CM | POA: Insufficient documentation

## 2021-03-21 ENCOUNTER — Telehealth: Payer: Self-pay | Admitting: *Deleted

## 2021-03-21 DIAGNOSIS — S71009S Unspecified open wound, unspecified hip, sequela: Secondary | ICD-10-CM

## 2021-03-21 NOTE — Telephone Encounter (Signed)
RTC to [facility] Would like Rx for bed pan, bedside table, reacher, sliding board, washable cloth underpads & high protein Ensure 90 cans. Written Rx on providers desk. Pt's wound is not doing well, is going to the wound center, but she said they had mentioned referring pt going to infectious disease for MRSA, she has been debrided twice

## 2021-03-24 NOTE — Addendum Note (Signed)
Addended by: [name] A on: 03/24/2021 01:59 PM   Modules accepted: Orders

## 2021-03-24 NOTE — Telephone Encounter (Signed)
Referral to ID placed.   Kim Dun, FNP

## 2021-04-03 ENCOUNTER — Emergency Department (HOSPITAL_COMMUNITY): Payer: Medicare Other

## 2021-04-03 ENCOUNTER — Encounter (HOSPITAL_COMMUNITY): Payer: Self-pay

## 2021-04-03 ENCOUNTER — Inpatient Hospital Stay (HOSPITAL_COMMUNITY)
Admission: EM | Admit: 2021-04-03 | Discharge: 2021-04-17 | DRG: 853 | Disposition: A | Discharge status: Home Health | Payer: Medicare Other | Attending: Internal Medicine | Admitting: Internal Medicine

## 2021-04-03 ENCOUNTER — Other Ambulatory Visit: Payer: Self-pay

## 2021-04-03 DIAGNOSIS — E8770 Fluid overload, unspecified: Secondary | ICD-10-CM | POA: Diagnosis present

## 2021-04-03 DIAGNOSIS — M86472 Chronic osteomyelitis with draining sinus, left ankle and foot: Secondary | ICD-10-CM

## 2021-04-03 DIAGNOSIS — E43 Unspecified severe protein-calorie malnutrition: Secondary | ICD-10-CM

## 2021-04-03 DIAGNOSIS — Z881 Allergy status to other antibiotic agents status: Secondary | ICD-10-CM

## 2021-04-03 DIAGNOSIS — E872 Acidosis, unspecified: Secondary | ICD-10-CM | POA: Diagnosis present

## 2021-04-03 DIAGNOSIS — G8929 Other chronic pain: Secondary | ICD-10-CM | POA: Diagnosis present

## 2021-04-03 DIAGNOSIS — Z96641 Presence of right artificial hip joint: Secondary | ICD-10-CM | POA: Diagnosis present

## 2021-04-03 DIAGNOSIS — M810 Age-related osteoporosis without current pathological fracture: Secondary | ICD-10-CM | POA: Diagnosis present

## 2021-04-03 DIAGNOSIS — R6520 Severe sepsis without septic shock: Secondary | ICD-10-CM

## 2021-04-03 DIAGNOSIS — L02215 Cutaneous abscess of perineum: Secondary | ICD-10-CM | POA: Diagnosis present

## 2021-04-03 DIAGNOSIS — F17290 Nicotine dependence, other tobacco product, uncomplicated: Secondary | ICD-10-CM | POA: Diagnosis present

## 2021-04-03 DIAGNOSIS — L89624 Pressure ulcer of left heel, stage 4: Secondary | ICD-10-CM | POA: Diagnosis present

## 2021-04-03 DIAGNOSIS — R6521 Severe sepsis with septic shock: Secondary | ICD-10-CM | POA: Diagnosis present

## 2021-04-03 DIAGNOSIS — E8809 Other disorders of plasma-protein metabolism, not elsewhere classified: Secondary | ICD-10-CM | POA: Diagnosis present

## 2021-04-03 DIAGNOSIS — E876 Hypokalemia: Secondary | ICD-10-CM | POA: Diagnosis present

## 2021-04-03 DIAGNOSIS — L270 Generalized skin eruption due to drugs and medicaments taken internally: Secondary | ICD-10-CM | POA: Diagnosis not present

## 2021-04-03 DIAGNOSIS — A419 Sepsis, unspecified organism: Secondary | ICD-10-CM | POA: Diagnosis not present

## 2021-04-03 DIAGNOSIS — L89214 Pressure ulcer of right hip, stage 4: Secondary | ICD-10-CM | POA: Diagnosis present

## 2021-04-03 DIAGNOSIS — E440 Moderate protein-calorie malnutrition: Secondary | ICD-10-CM | POA: Insufficient documentation

## 2021-04-03 DIAGNOSIS — F419 Anxiety disorder, unspecified: Secondary | ICD-10-CM | POA: Diagnosis present

## 2021-04-03 DIAGNOSIS — L89324 Pressure ulcer of left buttock, stage 4: Secondary | ICD-10-CM | POA: Diagnosis present

## 2021-04-03 DIAGNOSIS — B964 Proteus (mirabilis) (morganii) as the cause of diseases classified elsewhere: Secondary | ICD-10-CM | POA: Diagnosis present

## 2021-04-03 DIAGNOSIS — K612 Anorectal abscess: Secondary | ICD-10-CM | POA: Diagnosis present

## 2021-04-03 DIAGNOSIS — L89314 Pressure ulcer of right buttock, stage 4: Secondary | ICD-10-CM | POA: Diagnosis present

## 2021-04-03 DIAGNOSIS — I9589 Other hypotension: Secondary | ICD-10-CM | POA: Diagnosis present

## 2021-04-03 DIAGNOSIS — B954 Other streptococcus as the cause of diseases classified elsewhere: Secondary | ICD-10-CM | POA: Diagnosis present

## 2021-04-03 DIAGNOSIS — R32 Unspecified urinary incontinence: Secondary | ICD-10-CM | POA: Diagnosis present

## 2021-04-03 DIAGNOSIS — Z6822 Body mass index (BMI) 22.0-22.9, adult: Secondary | ICD-10-CM

## 2021-04-03 DIAGNOSIS — R739 Hyperglycemia, unspecified: Secondary | ICD-10-CM | POA: Diagnosis present

## 2021-04-03 DIAGNOSIS — F17210 Nicotine dependence, cigarettes, uncomplicated: Secondary | ICD-10-CM | POA: Diagnosis present

## 2021-04-03 DIAGNOSIS — E871 Hypo-osmolality and hyponatremia: Secondary | ICD-10-CM | POA: Diagnosis present

## 2021-04-03 DIAGNOSIS — M86652 Other chronic osteomyelitis, left thigh: Secondary | ICD-10-CM | POA: Diagnosis present

## 2021-04-03 DIAGNOSIS — L8994 Pressure ulcer of unspecified site, stage 4: Secondary | ICD-10-CM

## 2021-04-03 DIAGNOSIS — D500 Iron deficiency anemia secondary to blood loss (chronic): Secondary | ICD-10-CM | POA: Diagnosis present

## 2021-04-03 DIAGNOSIS — Z20822 Contact with and (suspected) exposure to covid-19: Secondary | ICD-10-CM | POA: Diagnosis present

## 2021-04-03 DIAGNOSIS — Z79899 Other long term (current) drug therapy: Secondary | ICD-10-CM

## 2021-04-03 DIAGNOSIS — M86651 Other chronic osteomyelitis, right thigh: Secondary | ICD-10-CM | POA: Diagnosis present

## 2021-04-03 DIAGNOSIS — G8222 Paraplegia, incomplete: Secondary | ICD-10-CM | POA: Diagnosis present

## 2021-04-03 DIAGNOSIS — K5909 Other constipation: Secondary | ICD-10-CM | POA: Diagnosis present

## 2021-04-03 LAB — CBC WITH DIFFERENTIAL/PLATELET
Abs Immature Granulocytes: 0.12 10*3/uL — ABNORMAL HIGH (ref 0.00–0.07)
Basophils Absolute: 0 10*3/uL (ref 0.0–0.1)
Basophils Relative: 0 %
Eosinophils Absolute: 0 10*3/uL (ref 0.0–0.5)
Eosinophils Relative: 0 %
HCT: 27.3 % — ABNORMAL LOW (ref 36.0–46.0)
Hemoglobin: 8.6 g/dL — ABNORMAL LOW (ref 12.0–15.0)
Immature Granulocytes: 1 %
Lymphocytes Relative: 19 %
Lymphs Abs: 2.6 10*3/uL (ref 0.7–4.0)
MCH: 30 pg (ref 26.0–34.0)
MCHC: 31.5 g/dL (ref 30.0–36.0)
MCV: 95.1 fL (ref 80.0–100.0)
Monocytes Absolute: 0.6 10*3/uL (ref 0.1–1.0)
Monocytes Relative: 5 %
Neutro Abs: 10.2 10*3/uL — ABNORMAL HIGH (ref 1.7–7.7)
Neutrophils Relative %: 75 %
Platelets: 436 10*3/uL — ABNORMAL HIGH (ref 150–400)
RBC: 2.87 MIL/uL — ABNORMAL LOW (ref 3.87–5.11)
RDW: 15.1 % (ref 11.5–15.5)
WBC: 13.5 10*3/uL — ABNORMAL HIGH (ref 4.0–10.5)
nRBC: 0 % (ref 0.0–0.2)

## 2021-04-03 LAB — PROTIME-INR
INR: 1.2 (ref 0.8–1.2)
Prothrombin Time: 14.9 seconds (ref 11.4–15.2)

## 2021-04-03 LAB — COMPREHENSIVE METABOLIC PANEL
ALT: 13 U/L (ref 0–44)
AST: 18 U/L (ref 15–41)
Albumin: <1.5 g/dL — ABNORMAL LOW (ref 3.5–5.0)
Alkaline Phosphatase: 137 U/L — ABNORMAL HIGH (ref 38–126)
Anion gap: 6 (ref 5–15)
BUN: 7 mg/dL (ref 6–20)
CO2: 31 mmol/L (ref 22–32)
Calcium: 7 mg/dL — ABNORMAL LOW (ref 8.9–10.3)
Chloride: 92 mmol/L — ABNORMAL LOW (ref 98–111)
Creatinine, Ser: 0.44 mg/dL (ref 0.44–1.00)
GFR, Estimated: >60 mL/min (ref >60)
Glucose, Bld: 128 mg/dL — ABNORMAL HIGH (ref 70–99)
Potassium: 2.5 mmol/L — CL (ref 3.5–5.1)
Sodium: 129 mmol/L — ABNORMAL LOW (ref 135–145)
Total Bilirubin: 0.4 mg/dL (ref 0.3–1.2)
Total Protein: 5.9 g/dL — ABNORMAL LOW (ref 6.5–8.1)

## 2021-04-03 LAB — URINALYSIS, ROUTINE W REFLEX MICROSCOPIC
Bilirubin Urine: NEGATIVE
Glucose, UA: NEGATIVE mg/dL
Ketones, ur: NEGATIVE mg/dL
Nitrite: POSITIVE — AB
Protein, ur: 30 mg/dL — AB
Specific Gravity, Urine: 1.013 (ref 1.005–1.030)
WBC, UA: >50 WBC/hpf — ABNORMAL HIGH (ref 0–5)
pH: 9 — ABNORMAL HIGH (ref 5.0–8.0)

## 2021-04-03 LAB — BLOOD GAS, ARTERIAL
Acid-Base Excess: 6.7 mmol/L — ABNORMAL HIGH (ref 0.0–2.0)
Bicarbonate: 30.5 mmol/L — ABNORMAL HIGH (ref 20.0–28.0)
Drawn by: 22223
FIO2: 21
O2 Saturation: 95.3 %
Patient temperature: 37
pCO2 arterial: 43.9 mmHg (ref 32.0–48.0)
pH, Arterial: 7.459 — ABNORMAL HIGH (ref 7.350–7.450)
pO2, Arterial: 79.3 mmHg — ABNORMAL LOW (ref 83.0–108.0)

## 2021-04-03 LAB — APTT: aPTT: 29 seconds (ref 24–36)

## 2021-04-03 LAB — RESP PANEL BY RT-PCR (FLU A&B, COVID) ARPGX2
Influenza A by PCR: NEGATIVE
Influenza B by PCR: NEGATIVE
SARS Coronavirus 2 by RT PCR: NEGATIVE

## 2021-04-03 LAB — LACTIC ACID, PLASMA
Lactic Acid, Venous: 3.2 mmol/L (ref 0.5–1.9)
Lactic Acid, Venous: 3.4 mmol/L (ref 0.5–1.9)
Lactic Acid, Venous: 3.7 mmol/L (ref 0.5–1.9)

## 2021-04-03 LAB — MAGNESIUM: Magnesium: 1.8 mg/dL (ref 1.7–2.4)

## 2021-04-03 LAB — PREGNANCY, URINE: Preg Test, Ur: NEGATIVE

## 2021-04-03 MED ORDER — LACTATED RINGERS IV BOLUS (SEPSIS)
1000.0000 mL | Freq: Once | INTRAVENOUS | Status: AC
  Administered 2021-04-03: 18:00:00 1000 mL via INTRAVENOUS

## 2021-04-03 MED ORDER — VANCOMYCIN HCL 750 MG/150ML IV SOLN
750.0000 mg | Freq: Two times a day (BID) | INTRAVENOUS | Status: DC
  Administered 2021-04-04 – 2021-04-05 (×4): 750 mg via INTRAVENOUS
  Filled 2021-04-03 (×11): qty 150

## 2021-04-03 MED ORDER — SODIUM CHLORIDE 0.9 % IV SOLN
2.0000 g | Freq: Once | INTRAVENOUS | Status: AC
  Administered 2021-04-03: 17:00:00 2 g via INTRAVENOUS
  Filled 2021-04-03: qty 20

## 2021-04-03 MED ORDER — POTASSIUM CHLORIDE 10 MEQ/100ML IV SOLN
10.0000 meq | Freq: Once | INTRAVENOUS | Status: AC
  Administered 2021-04-03: 18:00:00 10 meq via INTRAVENOUS
  Filled 2021-04-03: qty 100

## 2021-04-03 MED ORDER — POTASSIUM CHLORIDE 10 MEQ/100ML IV SOLN
10.0000 meq | INTRAVENOUS | Status: AC
  Administered 2021-04-03 (×2): 10 meq via INTRAVENOUS
  Filled 2021-04-03 (×2): qty 100

## 2021-04-03 MED ORDER — LACTATED RINGERS IV SOLN: INTRAVENOUS | Status: AC

## 2021-04-03 MED ORDER — LACTATED RINGERS IV BOLUS (SEPSIS)
1000.0000 mL | Freq: Once | INTRAVENOUS | Status: AC
  Administered 2021-04-03: 17:00:00 1000 mL via INTRAVENOUS

## 2021-04-03 MED ORDER — VANCOMYCIN HCL 1250 MG/250ML IV SOLN
1250.0000 mg | Freq: Once | INTRAVENOUS | Status: AC
  Administered 2021-04-03: 18:00:00 1250 mg via INTRAVENOUS
  Filled 2021-04-03: qty 250

## 2021-04-03 MED ORDER — VANCOMYCIN HCL IN DEXTROSE 1-5 GM/200ML-% IV SOLN
1000.0000 mg | Freq: Once | INTRAVENOUS | Status: DC
  Filled 2021-04-03: qty 200

## 2021-04-03 NOTE — Sepsis Progress Note (Signed)
ELink monitoring sepsis protocol 

## 2021-04-03 NOTE — ED Triage Notes (Signed)
Reports she is seeing an infectious disease MD at [facility] due to ulcers in feet to the bone.  Reports she is unable to move her legs now.  Used to be able to per patient.  Patient reports pain, bleeding and swelling.

## 2021-04-03 NOTE — Progress Notes (Addendum)
Pharmacy Antibiotic Note  Kim [name] is a 45 y.o. female admitted on 04/03/2021 with cellulitis.  Pharmacy has been consulted for Vancomycin dosing.  Plan: Vancomycin 1250mg  IV loading dose then 750 mg IV Q 12 hrs. Goal AUC 400-550. Expected AUC: 455 SCr used: 0.8(actual 0.44)  Also, ceftriaxone 2gm IV ordered F/u cxs and clinical progress Monitor V/S, labs and levels as indicated  Height: 5\' 6"  (167.6 cm) Weight: 63.5 kg (140 lb) IBW/kg (Calculated) : 59.3  Temp (24hrs), Avg:98.7 F (37.1 C), Min:98.5 F (36.9 C), Max:98.9 F (37.2 C)  Recent Labs  Lab 04/03/21 1603  WBC 13.5*  CREATININE 0.44  LATICACIDVEN 3.2*    Estimated Creatinine Clearance: 84.9 mL/min (by C-G formula based on SCr of 0.44 mg/dL).    No Known Allergies  Antimicrobials this admission: Vancomycin 12/29 >>  Ceftriaxone 12/29  Microbiology results: 12/29 BCx: pending  Thank you for allowing pharmacy to be a part of this patients care.  Isac Sarna, BS [place], [place] Clinical Pharmacist Pager [phone] 04/03/2021 5:18 PM

## 2021-04-03 NOTE — Progress Notes (Signed)
RT dropped off ABG to lab at 2004.
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
## 2021-04-03 NOTE — ED Provider Notes (Addendum)
Provider Note MRN:  [id number]  Arrival date & time: 04/04/21    ED Course and Medical Decision Making  Assumed care from Dr. Sabra Heck at shift change.  Sepsis of soft tissue origin, awaiting CT imaging, should be able to be admitted to the ICU here at [facility].  Patient with continued hypotension despite fluids, lactate is rising.  Decision made to start norepinephrine.  Central line placed, see procedural details below.  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  Discussed case with Dr. Arnoldo Morale of general surgery, who felt it best for patient to be cared for at [facility] for a possible joint general surgery and orthopedic case.  Dr. Rosendo Gros of general surgery at [facility] is made aware and will see patient when she arrives.  Dr. Ninfa Linden of orthopedic surgery is also consulted and made aware.  Dr. Carson Myrtle of critical care at [facility] accepts patient for admission, awaiting a bed.  Blood pressure is improved on norepinephrine, patient's mental status is intact, no airway concerns at this time.  7 AM update: Spoke with [facility] bed placement, no ICU beds available, not likely to be available anytime soon.  And so will send to emergency department at [facility].  Dr. Dayna Barker excepting.  .Critical Care Performed by: Maudie Flakes, MD Authorized by: Maudie Flakes, MD   Critical care provider statement:    Critical care time (minutes):  80   Critical care was necessary to treat or prevent imminent or life-threatening deterioration of the following conditions:  Sepsis   Critical care was time spent personally by me on the following activities:  Development of treatment plan with patient or surrogate, discussions with consultants, evaluation of patient's response to treatment, examination of patient, ordering and review of laboratory studies, ordering and review of radiographic studies, ordering and performing treatments and  interventions, pulse oximetry, re-evaluation of patient's condition and review of old charts   I assumed direction of critical care for this patient from another provider in my specialty: yes   .Central Line  Date/Time: 04/04/2021 2:52 AM Performed by: Maudie Flakes, MD Authorized by: Maudie Flakes, MD   Consent:    Consent obtained:  Verbal and emergent situation   Consent given by:  Patient   Risks, benefits, and alternatives were discussed: yes     Risks discussed:  Arterial puncture, bleeding, infection, incorrect placement, nerve damage and pneumothorax Universal protocol:    Procedure explained and questions answered to patient or proxy's satisfaction: yes     Immediately prior to procedure, a time out was called: yes     Patient identity confirmed:  Verbally with patient Pre-procedure details:    Indication(s): central venous access     Hand hygiene: Hand hygiene performed prior to insertion     Sterile barrier technique: All elements of maximal sterile technique followed     Skin preparation:  Chlorhexidine   Skin preparation agent: Skin preparation agent completely dried prior to procedure   Anesthesia:    Anesthesia method:  Local infiltration   Local anesthetic:  Lidocaine 1% w/o epi Procedure details:    Location:  R internal jugular   Patient position:  Trendelenburg   Procedural supplies:  Triple lumen   Catheter size:  7 Fr   Landmarks identified: yes     Ultrasound guidance: yes     Ultrasound guidance timing: real time     Sterile ultrasound techniques: Sterile gel  and sterile probe covers were used     Number of attempts:  1   Successful placement: yes   Post-procedure details:    Post-procedure:  Dressing applied and line sutured   Assessment:  Blood return through all ports, free fluid flow, no pneumothorax on x-ray and placement verified by x-ray   Procedure completion:  Tolerated well, no immediate complications  Final Clinical Impressions(s) / ED  Diagnoses     ICD-10-CM   1. Severe sepsis ([place])  A41.9    R65.20     2. Pressure injury, stage 4, unspecified location [facility])  L89.94       ED Discharge Orders     None       Discharge Instructions   None     Barth Kirks. [place] Small, [place] [email or web address]    Maudie Flakes, MD 04/04/21 [id number]    Maudie Flakes, MD 04/04/21 [phone]

## 2021-04-03 NOTE — Sepsis Progress Note (Addendum)
Requested third LA from PA-C

## 2021-04-03 NOTE — ED Notes (Signed)
Assumed care of pt at 1700 pt room 6

## 2021-04-04 ENCOUNTER — Emergency Department (HOSPITAL_COMMUNITY): Payer: Medicare Other | Admitting: Certified Registered"

## 2021-04-04 ENCOUNTER — Encounter (HOSPITAL_COMMUNITY)
Admission: EM | Disposition: A | Discharge status: Home Health | Payer: Self-pay | Source: Home / Self Care | Attending: Internal Medicine

## 2021-04-04 ENCOUNTER — Emergency Department (HOSPITAL_COMMUNITY): Payer: Medicare Other

## 2021-04-04 ENCOUNTER — Encounter (HOSPITAL_COMMUNITY): Payer: Self-pay | Admitting: Pulmonary Disease

## 2021-04-04 ENCOUNTER — Inpatient Hospital Stay (HOSPITAL_COMMUNITY): Payer: Medicare Other

## 2021-04-04 DIAGNOSIS — G8929 Other chronic pain: Secondary | ICD-10-CM | POA: Diagnosis present

## 2021-04-04 DIAGNOSIS — G834 Cauda equina syndrome: Secondary | ICD-10-CM | POA: Diagnosis not present

## 2021-04-04 DIAGNOSIS — M86651 Other chronic osteomyelitis, right thigh: Secondary | ICD-10-CM | POA: Diagnosis present

## 2021-04-04 DIAGNOSIS — F419 Anxiety disorder, unspecified: Secondary | ICD-10-CM | POA: Diagnosis present

## 2021-04-04 DIAGNOSIS — E871 Hypo-osmolality and hyponatremia: Secondary | ICD-10-CM | POA: Diagnosis not present

## 2021-04-04 DIAGNOSIS — E872 Acidosis, unspecified: Secondary | ICD-10-CM | POA: Diagnosis present

## 2021-04-04 DIAGNOSIS — M810 Age-related osteoporosis without current pathological fracture: Secondary | ICD-10-CM | POA: Diagnosis not present

## 2021-04-04 DIAGNOSIS — R008 Other abnormalities of heart beat: Secondary | ICD-10-CM | POA: Diagnosis not present

## 2021-04-04 DIAGNOSIS — L02215 Cutaneous abscess of perineum: Secondary | ICD-10-CM | POA: Diagnosis present

## 2021-04-04 DIAGNOSIS — G8222 Paraplegia, incomplete: Secondary | ICD-10-CM | POA: Diagnosis present

## 2021-04-04 DIAGNOSIS — K5909 Other constipation: Secondary | ICD-10-CM | POA: Diagnosis present

## 2021-04-04 DIAGNOSIS — E43 Unspecified severe protein-calorie malnutrition: Secondary | ICD-10-CM

## 2021-04-04 DIAGNOSIS — A419 Sepsis, unspecified organism: Secondary | ICD-10-CM | POA: Diagnosis present

## 2021-04-04 DIAGNOSIS — M86472 Chronic osteomyelitis with draining sinus, left ankle and foot: Secondary | ICD-10-CM | POA: Diagnosis not present

## 2021-04-04 DIAGNOSIS — D500 Iron deficiency anemia secondary to blood loss (chronic): Secondary | ICD-10-CM | POA: Diagnosis present

## 2021-04-04 DIAGNOSIS — Z20822 Contact with and (suspected) exposure to covid-19: Secondary | ICD-10-CM | POA: Diagnosis present

## 2021-04-04 DIAGNOSIS — F17210 Nicotine dependence, cigarettes, uncomplicated: Secondary | ICD-10-CM | POA: Diagnosis present

## 2021-04-04 DIAGNOSIS — L8994 Pressure ulcer of unspecified site, stage 4: Secondary | ICD-10-CM | POA: Diagnosis not present

## 2021-04-04 DIAGNOSIS — L89214 Pressure ulcer of right hip, stage 4: Secondary | ICD-10-CM | POA: Diagnosis present

## 2021-04-04 DIAGNOSIS — K612 Anorectal abscess: Secondary | ICD-10-CM | POA: Diagnosis present

## 2021-04-04 DIAGNOSIS — L0390 Cellulitis, unspecified: Secondary | ICD-10-CM | POA: Diagnosis not present

## 2021-04-04 DIAGNOSIS — L89314 Pressure ulcer of right buttock, stage 4: Secondary | ICD-10-CM | POA: Diagnosis present

## 2021-04-04 DIAGNOSIS — R6520 Severe sepsis without septic shock: Secondary | ICD-10-CM | POA: Diagnosis not present

## 2021-04-04 DIAGNOSIS — L89624 Pressure ulcer of left heel, stage 4: Secondary | ICD-10-CM | POA: Diagnosis not present

## 2021-04-04 DIAGNOSIS — M86652 Other chronic osteomyelitis, left thigh: Secondary | ICD-10-CM | POA: Diagnosis present

## 2021-04-04 DIAGNOSIS — B964 Proteus (mirabilis) (morganii) as the cause of diseases classified elsewhere: Secondary | ICD-10-CM | POA: Diagnosis present

## 2021-04-04 DIAGNOSIS — R6521 Severe sepsis with septic shock: Secondary | ICD-10-CM | POA: Diagnosis present

## 2021-04-04 DIAGNOSIS — L8944 Pressure ulcer of contiguous site of back, buttock and hip, stage 4: Secondary | ICD-10-CM | POA: Diagnosis not present

## 2021-04-04 DIAGNOSIS — L270 Generalized skin eruption due to drugs and medicaments taken internally: Secondary | ICD-10-CM | POA: Diagnosis not present

## 2021-04-04 DIAGNOSIS — L89324 Pressure ulcer of left buttock, stage 4: Secondary | ICD-10-CM | POA: Diagnosis present

## 2021-04-04 DIAGNOSIS — R32 Unspecified urinary incontinence: Secondary | ICD-10-CM | POA: Diagnosis present

## 2021-04-04 DIAGNOSIS — B954 Other streptococcus as the cause of diseases classified elsewhere: Secondary | ICD-10-CM | POA: Diagnosis present

## 2021-04-04 HISTORY — PX: INCISION AND DRAINAGE ABSCESS: SHX5864

## 2021-04-04 LAB — COMPREHENSIVE METABOLIC PANEL
ALT: 11 U/L (ref 0–44)
AST: 13 U/L — ABNORMAL LOW (ref 15–41)
Albumin: <1.5 g/dL — ABNORMAL LOW (ref 3.5–5.0)
Alkaline Phosphatase: 114 U/L (ref 38–126)
Anion gap: 8 (ref 5–15)
BUN: <5 mg/dL — ABNORMAL LOW (ref 6–20)
CO2: 30 mmol/L (ref 22–32)
Calcium: 7.3 mg/dL — ABNORMAL LOW (ref 8.9–10.3)
Chloride: 96 mmol/L — ABNORMAL LOW (ref 98–111)
Creatinine, Ser: 0.33 mg/dL — ABNORMAL LOW (ref 0.44–1.00)
GFR, Estimated: >60 mL/min (ref >60)
Glucose, Bld: 115 mg/dL — ABNORMAL HIGH (ref 70–99)
Potassium: 3.2 mmol/L — ABNORMAL LOW (ref 3.5–5.1)
Sodium: 134 mmol/L — ABNORMAL LOW (ref 135–145)
Total Bilirubin: 0.5 mg/dL (ref 0.3–1.2)
Total Protein: 5 g/dL — ABNORMAL LOW (ref 6.5–8.1)

## 2021-04-04 LAB — ECHOCARDIOGRAM COMPLETE
AR max vel: 2.41 cm2
AV Area VTI: 2.52 cm2
AV Area mean vel: 2.31 cm2
AV Mean grad: 4 mmHg
AV Peak grad: 7.2 mmHg
Ao pk vel: 1.34 m/s
Area-P 1/2: 4.17 cm2
Height: 66 in
S' Lateral: 3.2 cm
Weight: 2536.17 oz

## 2021-04-04 LAB — HEMOGLOBIN A1C
Hgb A1c MFr Bld: 4.6 % — ABNORMAL LOW (ref 4.8–5.6)
Mean Plasma Glucose: 85.32 mg/dL

## 2021-04-04 LAB — CBC WITH DIFFERENTIAL/PLATELET
Abs Immature Granulocytes: 0.11 10*3/uL — ABNORMAL HIGH (ref 0.00–0.07)
Basophils Absolute: 0 10*3/uL (ref 0.0–0.1)
Basophils Relative: 0 %
Eosinophils Absolute: 0 10*3/uL (ref 0.0–0.5)
Eosinophils Relative: 0 %
HCT: 24.8 % — ABNORMAL LOW (ref 36.0–46.0)
Hemoglobin: 7.9 g/dL — ABNORMAL LOW (ref 12.0–15.0)
Immature Granulocytes: 1 %
Lymphocytes Relative: 15 %
Lymphs Abs: 1.4 10*3/uL (ref 0.7–4.0)
MCH: 29.3 pg (ref 26.0–34.0)
MCHC: 31.9 g/dL (ref 30.0–36.0)
MCV: 91.9 fL (ref 80.0–100.0)
Monocytes Absolute: 0.1 10*3/uL (ref 0.1–1.0)
Monocytes Relative: 2 %
Neutro Abs: 7.9 10*3/uL — ABNORMAL HIGH (ref 1.7–7.7)
Neutrophils Relative %: 82 %
Platelets: 386 10*3/uL (ref 150–400)
RBC: 2.7 MIL/uL — ABNORMAL LOW (ref 3.87–5.11)
RDW: 14.8 % (ref 11.5–15.5)
WBC: 9.7 10*3/uL (ref 4.0–10.5)
nRBC: 0 % (ref 0.0–0.2)

## 2021-04-04 LAB — PROCALCITONIN: Procalcitonin: 0.25 ng/mL

## 2021-04-04 LAB — POCT I-STAT, CHEM 8
BUN: <3 mg/dL — ABNORMAL LOW (ref 6–20)
Calcium, Ion: 1.1 mmol/L — ABNORMAL LOW (ref 1.15–1.40)
Chloride: 93 mmol/L — ABNORMAL LOW (ref 98–111)
Creatinine, Ser: 0.3 mg/dL — ABNORMAL LOW (ref 0.44–1.00)
Glucose, Bld: 102 mg/dL — ABNORMAL HIGH (ref 70–99)
HCT: 24 % — ABNORMAL LOW (ref 36.0–46.0)
Hemoglobin: 8.2 g/dL — ABNORMAL LOW (ref 12.0–15.0)
Potassium: 3.2 mmol/L — ABNORMAL LOW (ref 3.5–5.1)
Sodium: 135 mmol/L (ref 135–145)
TCO2: 32 mmol/L (ref 22–32)

## 2021-04-04 LAB — HIV ANTIBODY (ROUTINE TESTING W REFLEX): HIV Screen 4th Generation wRfx: NONREACTIVE

## 2021-04-04 LAB — OSMOLALITY: Osmolality: 279 mOsm/kg (ref 275–295)

## 2021-04-04 LAB — PROTIME-INR
INR: 1.2 (ref 0.8–1.2)
Prothrombin Time: 14.8 seconds (ref 11.4–15.2)

## 2021-04-04 LAB — SURGICAL PCR SCREEN
MRSA, PCR: NEGATIVE
Staphylococcus aureus: NEGATIVE

## 2021-04-04 LAB — MAGNESIUM: Magnesium: 1.6 mg/dL — ABNORMAL LOW (ref 1.7–2.4)

## 2021-04-04 LAB — GLUCOSE, CAPILLARY
Glucose-Capillary: 227 mg/dL — ABNORMAL HIGH (ref 70–99)
Glucose-Capillary: 195 mg/dL — ABNORMAL HIGH (ref 70–99)

## 2021-04-04 LAB — PHOSPHORUS: Phosphorus: 3.7 mg/dL (ref 2.5–4.6)

## 2021-04-04 LAB — MRSA NEXT GEN BY PCR, NASAL: MRSA by PCR Next Gen: NOT DETECTED

## 2021-04-04 SURGERY — INCISION AND DRAINAGE, ABSCESS
Anesthesia: General | Site: Buttocks

## 2021-04-04 MED ORDER — CLINDAMYCIN PHOSPHATE 900 MG/50ML IV SOLN
900.0000 mg | Freq: Once | INTRAVENOUS | Status: AC
  Administered 2021-04-04: 03:00:00 900 mg via INTRAVENOUS
  Filled 2021-04-04: qty 50

## 2021-04-04 MED ORDER — ACETAMINOPHEN 325 MG PO TABS
650.0000 mg | ORAL_TABLET | ORAL | Status: DC | PRN
  Administered 2021-04-05 – 2021-04-15 (×10): 650 mg via ORAL
  Filled 2021-04-04 (×11): qty 2

## 2021-04-04 MED ORDER — LIDOCAINE 2% (20 MG/ML) 5 ML SYRINGE
INTRAMUSCULAR | Status: DC | PRN
  Administered 2021-04-04: 60 mg via INTRAVENOUS

## 2021-04-04 MED ORDER — FOLIC ACID 1 MG PO TABS
1.0000 mg | ORAL_TABLET | Freq: Every day | ORAL | Status: DC
  Administered 2021-04-04 – 2021-04-16 (×13): 1 mg via ORAL
  Filled 2021-04-04 (×13): qty 1

## 2021-04-04 MED ORDER — SUGAMMADEX SODIUM 200 MG/2ML IV SOLN
INTRAVENOUS | Status: DC | PRN
  Administered 2021-04-04: 300 mg via INTRAVENOUS

## 2021-04-04 MED ORDER — DOCUSATE SODIUM 100 MG PO CAPS
100.0000 mg | ORAL_CAPSULE | Freq: Two times a day (BID) | ORAL | Status: DC | PRN
  Administered 2021-04-05: 100 mg via ORAL
  Filled 2021-04-04: qty 1

## 2021-04-04 MED ORDER — ONDANSETRON HCL 4 MG/2ML IJ SOLN
4.0000 mg | Freq: Four times a day (QID) | INTRAMUSCULAR | Status: DC | PRN
  Administered 2021-04-10 – 2021-04-12 (×2): 4 mg via INTRAVENOUS
  Filled 2021-04-04 (×2): qty 2

## 2021-04-04 MED ORDER — NICOTINE 7 MG/24HR TD PT24
7.0000 mg | MEDICATED_PATCH | Freq: Every day | TRANSDERMAL | Status: DC
  Administered 2021-04-05 – 2021-04-08 (×4): 7 mg via TRANSDERMAL
  Filled 2021-04-04 (×6): qty 1

## 2021-04-04 MED ORDER — LIDOCAINE 2% (20 MG/ML) 5 ML SYRINGE
INTRAMUSCULAR | Status: AC
  Filled 2021-04-04: qty 5

## 2021-04-04 MED ORDER — CHLORHEXIDINE GLUCONATE 0.12 % MT SOLN
OROMUCOSAL | Status: AC
  Administered 2021-04-04: 12:00:00 15 mL via OROMUCOSAL
  Filled 2021-04-04: qty 15

## 2021-04-04 MED ORDER — 0.9 % SODIUM CHLORIDE (POUR BTL) OPTIME
TOPICAL | Status: DC | PRN
  Administered 2021-04-04: 13:00:00 1000 mL

## 2021-04-04 MED ORDER — DEXAMETHASONE SODIUM PHOSPHATE 10 MG/ML IJ SOLN
INTRAMUSCULAR | Status: DC | PRN
  Administered 2021-04-04: 5 mg via INTRAVENOUS

## 2021-04-04 MED ORDER — ONDANSETRON HCL 4 MG/2ML IJ SOLN
INTRAMUSCULAR | Status: AC
  Filled 2021-04-04: qty 2

## 2021-04-04 MED ORDER — NOREPINEPHRINE 4 MG/250ML-% IV SOLN
0.0000 ug/min | INTRAVENOUS | Status: DC
  Administered 2021-04-04: 15:00:00 9 ug/min via INTRAVENOUS
  Administered 2021-04-04: 02:00:00 5 ug/min via INTRAVENOUS
  Administered 2021-04-04: 7 ug/min via INTRAVENOUS
  Administered 2021-04-05: 4 ug/min via INTRAVENOUS
  Administered 2021-04-05: 7 ug/min via INTRAVENOUS
  Filled 2021-04-04 (×6): qty 250

## 2021-04-04 MED ORDER — PROPOFOL 10 MG/ML IV BOLUS
INTRAVENOUS | Status: AC
  Filled 2021-04-04: qty 20

## 2021-04-04 MED ORDER — DEXAMETHASONE SODIUM PHOSPHATE 10 MG/ML IJ SOLN
INTRAMUSCULAR | Status: AC
  Filled 2021-04-04: qty 1

## 2021-04-04 MED ORDER — AMISULPRIDE (ANTIEMETIC) 5 MG/2ML IV SOLN: 10.0000 mg | Freq: Once | INTRAVENOUS | Status: DC | PRN

## 2021-04-04 MED ORDER — OXYCODONE HCL 5 MG/5ML PO SOLN: 5.0000 mg | Freq: Once | ORAL | Status: DC | PRN

## 2021-04-04 MED ORDER — CHLORHEXIDINE GLUCONATE CLOTH 2 % EX PADS
6.0000 | MEDICATED_PAD | Freq: Every day | CUTANEOUS | Status: DC
  Administered 2021-04-04 – 2021-04-15 (×12): 6 via TOPICAL

## 2021-04-04 MED ORDER — IOHEXOL 300 MG/ML  SOLN
100.0000 mL | Freq: Once | INTRAMUSCULAR | Status: AC | PRN
  Administered 2021-04-04: 100 mL via INTRAVENOUS

## 2021-04-04 MED ORDER — ONDANSETRON HCL 4 MG/2ML IJ SOLN
INTRAMUSCULAR | Status: DC | PRN
  Administered 2021-04-04: 4 mg via INTRAVENOUS

## 2021-04-04 MED ORDER — POLYETHYLENE GLYCOL 3350 17 G PO PACK
17.0000 g | PACK | Freq: Every day | ORAL | Status: DC
  Administered 2021-04-06: 17 g via ORAL
  Filled 2021-04-04 (×2): qty 1

## 2021-04-04 MED ORDER — INSULIN ASPART 100 UNIT/ML IJ SOLN
0.0000 [IU] | INTRAMUSCULAR | Status: DC
  Administered 2021-04-04: 3 [IU] via SUBCUTANEOUS
  Administered 2021-04-04: 20:00:00 5 [IU] via SUBCUTANEOUS
  Administered 2021-04-05: 3 [IU] via SUBCUTANEOUS
  Administered 2021-04-05: 1 [IU] via SUBCUTANEOUS
  Administered 2021-04-05: 2 [IU] via SUBCUTANEOUS

## 2021-04-04 MED ORDER — ONDANSETRON HCL 4 MG/2ML IJ SOLN: 4.0000 mg | Freq: Once | INTRAMUSCULAR | Status: DC | PRN

## 2021-04-04 MED ORDER — POTASSIUM CHLORIDE CRYS ER 20 MEQ PO TBCR
40.0000 meq | EXTENDED_RELEASE_TABLET | Freq: Once | ORAL | Status: AC
  Administered 2021-04-04: 18:00:00 40 meq via ORAL
  Filled 2021-04-04: qty 2

## 2021-04-04 MED ORDER — CEFEPIME HCL 2 G IJ SOLR
2.0000 g | Freq: Three times a day (TID) | INTRAMUSCULAR | Status: DC
  Administered 2021-04-04 – 2021-04-07 (×9): 2 g via INTRAVENOUS
  Filled 2021-04-04 (×9): qty 2

## 2021-04-04 MED ORDER — FENTANYL CITRATE PF 50 MCG/ML IJ SOSY
50.0000 ug | PREFILLED_SYRINGE | Freq: Once | INTRAMUSCULAR | Status: AC
  Administered 2021-04-04: 02:00:00 50 ug via INTRAVENOUS
  Filled 2021-04-04: qty 1

## 2021-04-04 MED ORDER — OXYCODONE HCL 5 MG PO TABS: 5.0000 mg | ORAL_TABLET | Freq: Once | ORAL | Status: DC | PRN

## 2021-04-04 MED ORDER — ROCURONIUM BROMIDE 10 MG/ML (PF) SYRINGE
PREFILLED_SYRINGE | INTRAVENOUS | Status: DC | PRN
  Administered 2021-04-04: 60 mg via INTRAVENOUS

## 2021-04-04 MED ORDER — ROCURONIUM BROMIDE 10 MG/ML (PF) SYRINGE
PREFILLED_SYRINGE | INTRAVENOUS | Status: AC
  Filled 2021-04-04: qty 10

## 2021-04-04 MED ORDER — ORAL CARE MOUTH RINSE: 15.0000 mL | Freq: Once | OROMUCOSAL | Status: AC

## 2021-04-04 MED ORDER — ADULT MULTIVITAMIN LIQUID CH
15.0000 mL | Freq: Every day | ORAL | Status: DC
  Administered 2021-04-04: 15 mL via ORAL
  Filled 2021-04-04 (×2): qty 15

## 2021-04-04 MED ORDER — PANTOPRAZOLE SODIUM 40 MG IV SOLR
40.0000 mg | Freq: Every day | INTRAVENOUS | Status: DC
  Administered 2021-04-04: 21:00:00 40 mg via INTRAVENOUS
  Filled 2021-04-04: qty 40

## 2021-04-04 MED ORDER — CHLORHEXIDINE GLUCONATE 0.12 % MT SOLN: 15.0000 mL | Freq: Once | OROMUCOSAL | Status: AC

## 2021-04-04 MED ORDER — BISACODYL 5 MG PO TBEC
10.0000 mg | DELAYED_RELEASE_TABLET | Freq: Every day | ORAL | Status: DC | PRN
  Administered 2021-04-05: 10 mg via ORAL
  Filled 2021-04-04 (×2): qty 2

## 2021-04-04 MED ORDER — FENTANYL CITRATE (PF) 100 MCG/2ML IJ SOLN: 25.0000 ug | INTRAMUSCULAR | Status: DC | PRN

## 2021-04-04 MED ORDER — MAGNESIUM SULFATE 2 GM/50ML IV SOLN
2.0000 g | Freq: Once | INTRAVENOUS | Status: AC
  Administered 2021-04-04: 17:00:00 2 g via INTRAVENOUS
  Filled 2021-04-04: qty 50

## 2021-04-04 MED ORDER — THIAMINE HCL 100 MG PO TABS
100.0000 mg | ORAL_TABLET | Freq: Every day | ORAL | Status: DC
  Administered 2021-04-04 – 2021-04-16 (×13): 100 mg via ORAL
  Filled 2021-04-04 (×13): qty 1

## 2021-04-04 MED ORDER — MIDAZOLAM HCL 2 MG/2ML IJ SOLN
INTRAMUSCULAR | Status: AC
  Filled 2021-04-04: qty 2

## 2021-04-04 MED ORDER — MIDAZOLAM HCL 2 MG/2ML IJ SOLN
INTRAMUSCULAR | Status: DC | PRN
  Administered 2021-04-04: 1 mg via INTRAVENOUS

## 2021-04-04 MED ORDER — PIPERACILLIN-TAZOBACTAM 3.375 G IVPB 30 MIN
3.3750 g | Freq: Once | INTRAVENOUS | Status: AC
  Administered 2021-04-04: 03:00:00 3.375 g via INTRAVENOUS
  Filled 2021-04-04: qty 50

## 2021-04-04 MED ORDER — FENTANYL CITRATE (PF) 250 MCG/5ML IJ SOLN
INTRAMUSCULAR | Status: DC | PRN
  Administered 2021-04-04: 25 ug via INTRAVENOUS
  Administered 2021-04-04: 50 ug via INTRAVENOUS
  Administered 2021-04-04: 25 ug via INTRAVENOUS

## 2021-04-04 MED ORDER — PROPOFOL 10 MG/ML IV BOLUS
INTRAVENOUS | Status: DC | PRN
  Administered 2021-04-04: 200 mg via INTRAVENOUS

## 2021-04-04 MED ORDER — FENTANYL CITRATE (PF) 250 MCG/5ML IJ SOLN
INTRAMUSCULAR | Status: AC
  Filled 2021-04-04: qty 5

## 2021-04-04 MED ORDER — HEPARIN SODIUM (PORCINE) 5000 UNIT/ML IJ SOLN
5000.0000 [IU] | Freq: Three times a day (TID) | INTRAMUSCULAR | Status: DC
  Administered 2021-04-05 – 2021-04-17 (×37): 5000 [IU] via SUBCUTANEOUS
  Filled 2021-04-04 (×37): qty 1

## 2021-04-04 MED ORDER — METRONIDAZOLE 500 MG/100ML IV SOLN
500.0000 mg | Freq: Two times a day (BID) | INTRAVENOUS | Status: DC
  Administered 2021-04-04 – 2021-04-07 (×6): 500 mg via INTRAVENOUS
  Filled 2021-04-04 (×6): qty 100

## 2021-04-04 MED ORDER — POLYETHYLENE GLYCOL 3350 17 G PO PACK: 17.0000 g | PACK | Freq: Every day | ORAL | Status: DC | PRN

## 2021-04-04 MED ORDER — LACTATED RINGERS IV SOLN: INTRAVENOUS | Status: DC

## 2021-04-04 SURGICAL SUPPLY — 26 items
BAG COUNTER SPONGE SURGICOUNT (BAG) ×2 IMPLANT
BAG SPNG CNTER NS LX DISP (BAG) ×1
BNDG GAUZE ELAST 4 BULKY (GAUZE/BANDAGES/DRESSINGS) ×2 IMPLANT
CANISTER SUCT 3000ML PPV (MISCELLANEOUS) ×2 IMPLANT
COVER SURGICAL LIGHT HANDLE (MISCELLANEOUS) ×2 IMPLANT
DRAPE LAPAROSCOPIC ABDOMINAL (DRAPES) IMPLANT
DRSG PAD ABDOMINAL 8X10 ST (GAUZE/BANDAGES/DRESSINGS) ×2 IMPLANT
ELECT CAUTERY BLADE 6.4 (BLADE) ×2 IMPLANT
ELECT REM PT RETURN 9FT ADLT (ELECTROSURGICAL) ×2
ELECTRODE REM PT RTRN 9FT ADLT (ELECTROSURGICAL) ×1 IMPLANT
GLOVE SURG ENC MOIS LTX SZ7 (GLOVE) ×2 IMPLANT
GLOVE SURG UNDER POLY LF SZ7.5 (GLOVE) ×2 IMPLANT
GOWN STRL REUS W/ TWL LRG LVL3 (GOWN DISPOSABLE) ×2 IMPLANT
GOWN STRL REUS W/TWL LRG LVL3 (GOWN DISPOSABLE) ×4
KIT BASIN OR (CUSTOM PROCEDURE TRAY) ×2 IMPLANT
KIT TURNOVER KIT B (KITS) ×2 IMPLANT
NS IRRIG 1000ML POUR BTL (IV SOLUTION) ×2 IMPLANT
PACK GENERAL/GYN (CUSTOM PROCEDURE TRAY) ×2 IMPLANT
PAD ARMBOARD 7.5X6 YLW CONV (MISCELLANEOUS) ×2 IMPLANT
PENCIL SMOKE EVACUATOR (MISCELLANEOUS) ×2 IMPLANT
SUT VIC AB 2-0 SH 27 (SUTURE) ×4
SUT VIC AB 2-0 SH 27X BRD (SUTURE) IMPLANT
SWAB COLLECTION DEVICE MRSA (MISCELLANEOUS) ×1 IMPLANT
SWAB CULTURE ESWAB REG 1ML (MISCELLANEOUS) ×1 IMPLANT
TOWEL GREEN STERILE (TOWEL DISPOSABLE) ×2 IMPLANT
TOWEL GREEN STERILE FF (TOWEL DISPOSABLE) ×2 IMPLANT
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
## 2021-04-04 NOTE — Op Note (Signed)
Excisional debridement:  1.  Progress note or procedure note with a detailed description of the procedure. Preoperative diagnosis: sacral wounds, hip wounds, calcaneal wounds, abscess Postoperative diagnosis: saa Procedure: drainage of perineal abscess, debridement of 11x4x2 cm of skin, soft tissue and muscle. Surgeon: Dr Serita Grammes Anesthesia: general EBL: 50 cc Complications: None Specimens: Cultures to microbiology Special count was correct completion Decision ICU  Indications: This a 45 year old incomplete paraplegia comes at sacral and hip wounds as well as a calcaneal wound for some time.  She came in ill with an elevated white blood cell count elevated lactate and a CT scan that shows a likely perineal or perirectal abscess.  We discussed going the operating for drainage.  Procedure: After informed consent was obtained the patient was taken to the operating.  She was already on antibiotics.  SCDs were in place.  She was placed on general anesthesia without complication.  She was prepped and draped in standard sterile surgical fashion.  Surgical timeout was then performed.  I cultured the purulence present in both left-sided wounds.  Dr. Was also there to evaluate her wounds.  Her prosthetic hip is visible in the right side.  The left lateral wound as well as the left medial wound I debrided an area that was 11 x 4 x 2 cm of skin, soft tissue, and muscle.  This was all of the nonviable tissue.  There was purulence in the lateral as well as the medial wound that tracked into the perineum.  I drained this adequately.  I then obtained hemostasis.  I obtained hemostasis both with the cautery as well as multiple sutures.  I then packed the wounds with Betadine soaked Kerlix.  She tolerated this well was extubated transferred to recovery.    2.  Tool used for debridement (curette, scapel, etc.)  cautery and curette  3.  Frequency of surgical debridement.   once  4.  Measurement of  total devitalized tissue (wound surface) before and after surgical debridement.   11x4x2  5.  Area and depth of devitalized tissue removed from wound.  11x4x2  6.  Blood loss and description of tissue removed.  50 cc, necrotic  7.  Evidence of the progress of the wound's response to treatment.  A.  Current wound volume (current dimensions and depth).  Left 9x7x3 cm, middle wound 4x8x8 cm, right 5x7x7 cm  B.  Presence (and extent of) of infection.  None now  C.  Presence (and extent of) of non viable tissue.  None visible now  D.  Other material in the wound that is expected to inhibit healing.  pressure  8.  Was there any viable tissue removed (measurements): no

## 2021-04-04 NOTE — ED Notes (Signed)
Critical care, [facility] paged to Dr Sedonia Small.

## 2021-04-04 NOTE — ED Notes (Signed)
EDP at bedside. [employer] moved to bedside at this time.

## 2021-04-04 NOTE — ED Notes (Addendum)
Pt verbalized understanding that she is NPO.
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
## 2021-04-04 NOTE — Anesthesia Postprocedure Evaluation (Signed)
Anesthesia Post Note  Patient: Kim [name]  Procedure(s) Performed: INCISION AND DRAINAGE SACRAL ABSCESS (Buttocks)     Patient location during evaluation: PACU Anesthesia Type: General Level of consciousness: awake and alert Pain management: pain level controlled Vital Signs Assessment: post-procedure vital signs reviewed and stable Respiratory status: spontaneous breathing, nonlabored ventilation and respiratory function stable Cardiovascular status: blood pressure returned to baseline and stable Postop Assessment: no apparent nausea or vomiting Anesthetic complications: no   No notable events documented.  Last Vitals:  Vitals:   04/04/21 1325 04/04/21 1340  BP: 105/76 106/72  Pulse: 73 63  Resp: 14 15  Temp:  (!) 36.1 C  SpO2: 100% 100%    Last Pain:  Vitals:   04/04/21 1310  TempSrc:   PainSc: 3                  Lidia Collum

## 2021-04-04 NOTE — ED Notes (Signed)
Xray at bedside. EDP at bedside.

## 2021-04-04 NOTE — Plan of Care (Signed)

## 2021-04-04 NOTE — ED Notes (Signed)
Pt wounds to sacral area and left heal assessed by provider.  Pt arrived with wounds packed and covered,  Mepiplex dressing applied to left and right sacral wounds, and to pt left heal area.  Sacral wounds noted to have blackening to area, area is tunneled red with tenacious thick drainage odiferous.

## 2021-04-04 NOTE — TOC Progression Note (Signed)
°  Transition of Care (TOC) Screening Note   Patient Details  Name: Kim [name] Date of Birth: January 04, 1976   Transition of Care [facility]) CM/SW Contact:    Luvena Wentling C Tarpley-Carter, Ros[place] Phone Number: 04/04/2021, 2:35 PM  Clinical Narrative:     Transition of Care Department (TOC) has reviewed patient and no TOC needs have been identified at this time. We will continue to monitor patient advancement through interdisciplinary progression rounds. If new patient transition needs arise, please place a TOC consult.         Expected Discharge Plan and Services                                                 Social Determinants of Health (SDOH) Interventions    Readmission Risk Interventions No flowsheet data found.
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
## 2021-04-04 NOTE — Progress Notes (Signed)
°  Echocardiogram 2D Echocardiogram has been performed.  Merrie Roof F 04/04/2021, 3:54 PM

## 2021-04-04 NOTE — Anesthesia Procedure Notes (Signed)
Procedure Name: Intubation Date/Time: 04/04/2021 12:06 PM Performed by: Gaylene Brooks, CRNA Pre-anesthesia Checklist: Patient identified, Emergency Drugs available, Suction available and Patient being monitored Patient Re-evaluated:Patient Re-evaluated prior to induction Oxygen Delivery Method: Circle System Utilized Preoxygenation: Pre-oxygenation with 100% oxygen Induction Type: IV induction Ventilation: Mask ventilation without difficulty Laryngoscope Size: Miller and 2 Grade View: Grade I Tube type: Oral Number of attempts: 1 Airway Equipment and Method: Stylet and Oral airway Placement Confirmation: ETT inserted through vocal cords under direct vision, positive ETCO2 and breath sounds checked- equal and bilateral Secured at: 20 cm Tube secured with: Tape Dental Injury: Teeth and Oropharynx as per pre-operative assessment

## 2021-04-04 NOTE — ED Provider Notes (Signed)
7:23 AM Patient signed out to me by previous ED physician. Calcaneal osteomyelitis, perirectal access, pubic bone osteomyelitis. Sepsis. Central line in place. Norepi @10 . Currently stable vitals. Accepted to [facility]. Awaiting transfer.   2:00 PM EMS here for patient. Stable upon transfer.   Dr. Campbell Stall 58/30/94   Campbell Stall P, DO 07/68/08 2135
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
## 2021-04-04 NOTE — Transfer of Care (Signed)
Immediate Anesthesia Transfer of Care Note  Patient: Kim [name]  Procedure(s) Performed: INCISION AND DRAINAGE SACRAL ABSCESS (Buttocks)  Patient Location: PACU  Anesthesia Type:General  Level of Consciousness: awake, alert  and oriented  Airway & Oxygen Therapy: Patient Spontanous Breathing and Patient connected to nasal cannula oxygen  Post-op Assessment: Report given to RN and Post -op Vital signs reviewed and stable  Post vital signs: Reviewed and stable  Last Vitals:  Vitals Value Taken Time  BP 100/84 04/04/21 1307  Temp    Pulse 25 04/04/21 1308  Resp 12 04/04/21 1308  SpO2 80 % 04/04/21 1308  Vitals shown include unvalidated device data.  Last Pain:  Vitals:   04/04/21 1140  TempSrc:   PainSc: 6       Patients Stated Pain Goal: 2 (19/01/22 2411)  Complications: No notable events documented.

## 2021-04-05 ENCOUNTER — Encounter (HOSPITAL_COMMUNITY): Payer: Self-pay | Admitting: General Surgery

## 2021-04-05 ENCOUNTER — Inpatient Hospital Stay (HOSPITAL_COMMUNITY): Payer: Medicare Other

## 2021-04-05 DIAGNOSIS — R6521 Severe sepsis with septic shock: Secondary | ICD-10-CM | POA: Diagnosis not present

## 2021-04-05 DIAGNOSIS — A419 Sepsis, unspecified organism: Secondary | ICD-10-CM | POA: Diagnosis not present

## 2021-04-05 LAB — BASIC METABOLIC PANEL
Anion gap: 5 (ref 5–15)
BUN: 6 mg/dL (ref 6–20)
CO2: 29 mmol/L (ref 22–32)
Calcium: 7.1 mg/dL — ABNORMAL LOW (ref 8.9–10.3)
Chloride: 98 mmol/L (ref 98–111)
Creatinine, Ser: 0.34 mg/dL — ABNORMAL LOW (ref 0.44–1.00)
GFR, Estimated: >60 mL/min (ref >60)
Glucose, Bld: 111 mg/dL — ABNORMAL HIGH (ref 70–99)
Potassium: 3.8 mmol/L (ref 3.5–5.1)
Sodium: 132 mmol/L — ABNORMAL LOW (ref 135–145)

## 2021-04-05 LAB — CBC
HCT: 22.6 % — ABNORMAL LOW (ref 36.0–46.0)
Hemoglobin: 7 g/dL — ABNORMAL LOW (ref 12.0–15.0)
MCH: 29.2 pg (ref 26.0–34.0)
MCHC: 31 g/dL (ref 30.0–36.0)
MCV: 94.2 fL (ref 80.0–100.0)
Platelets: 384 10*3/uL (ref 150–400)
RBC: 2.4 MIL/uL — ABNORMAL LOW (ref 3.87–5.11)
RDW: 14.7 % (ref 11.5–15.5)
WBC: 9.9 10*3/uL (ref 4.0–10.5)
nRBC: 0 % (ref 0.0–0.2)

## 2021-04-05 LAB — GLUCOSE, CAPILLARY
Glucose-Capillary: 122 mg/dL — ABNORMAL HIGH (ref 70–99)
Glucose-Capillary: 110 mg/dL — ABNORMAL HIGH (ref 70–99)
Glucose-Capillary: 116 mg/dL — ABNORMAL HIGH (ref 70–99)
Glucose-Capillary: 158 mg/dL — ABNORMAL HIGH (ref 70–99)
Glucose-Capillary: 141 mg/dL — ABNORMAL HIGH (ref 70–99)
Glucose-Capillary: 86 mg/dL (ref 70–99)

## 2021-04-05 LAB — SODIUM
Sodium: 131 mmol/L — ABNORMAL LOW (ref 135–145)
Sodium: 131 mmol/L — ABNORMAL LOW (ref 135–145)
Sodium: 131 mmol/L — ABNORMAL LOW (ref 135–145)

## 2021-04-05 LAB — HEMOGLOBIN AND HEMATOCRIT, BLOOD
HCT: 25.9 % — ABNORMAL LOW (ref 36.0–46.0)
Hemoglobin: 8.4 g/dL — ABNORMAL LOW (ref 12.0–15.0)

## 2021-04-05 LAB — PHOSPHORUS: Phosphorus: 3 mg/dL (ref 2.5–4.6)

## 2021-04-05 LAB — ABO/RH: ABO/RH(D): O NEG

## 2021-04-05 LAB — LACTIC ACID, PLASMA: Lactic Acid, Venous: 1.5 mmol/L (ref 0.5–1.9)

## 2021-04-05 LAB — MAGNESIUM: Magnesium: 1.9 mg/dL (ref 1.7–2.4)

## 2021-04-05 LAB — PREPARE RBC (CROSSMATCH)

## 2021-04-05 LAB — VITAMIN D 25 HYDROXY (VIT D DEFICIENCY, FRACTURES): Vit D, 25-Hydroxy: 28.2 ng/mL — ABNORMAL LOW (ref 30–100)

## 2021-04-05 MED ORDER — BUPRENORPHINE HCL-NALOXONE HCL 8-2 MG SL SUBL
1.0000 | SUBLINGUAL_TABLET | Freq: Every day | SUBLINGUAL | Status: DC
  Administered 2021-04-05: 1 via SUBLINGUAL
  Filled 2021-04-05: qty 1

## 2021-04-05 MED ORDER — ADULT MULTIVITAMIN W/MINERALS CH
1.0000 | ORAL_TABLET | Freq: Every day | ORAL | Status: DC
  Administered 2021-04-05 – 2021-04-16 (×12): 1 via ORAL
  Filled 2021-04-05 (×12): qty 1

## 2021-04-05 MED ORDER — VITAMIN D 25 MCG (1000 UNIT) PO TABS
1000.0000 [IU] | ORAL_TABLET | Freq: Every day | ORAL | Status: DC
  Administered 2021-04-05 – 2021-04-16 (×12): 1000 [IU] via ORAL
  Filled 2021-04-05 (×12): qty 1

## 2021-04-05 MED ORDER — CLONAZEPAM 0.5 MG PO TABS
0.5000 mg | ORAL_TABLET | Freq: Two times a day (BID) | ORAL | Status: DC
  Administered 2021-04-05 – 2021-04-16 (×24): 0.5 mg via ORAL
  Filled 2021-04-05 (×24): qty 1

## 2021-04-05 MED ORDER — ENSURE ENLIVE PO LIQD
237.0000 mL | Freq: Two times a day (BID) | ORAL | Status: DC
  Administered 2021-04-05 – 2021-04-16 (×20): 237 mL via ORAL

## 2021-04-05 MED ORDER — GABAPENTIN 600 MG PO TABS
600.0000 mg | ORAL_TABLET | Freq: Two times a day (BID) | ORAL | Status: DC
  Administered 2021-04-05 – 2021-04-16 (×24): 600 mg via ORAL
  Filled 2021-04-05 (×25): qty 1

## 2021-04-05 MED ORDER — SODIUM CHLORIDE 0.9% FLUSH: 10.0000 mL | INTRAVENOUS | Status: DC | PRN

## 2021-04-05 MED ORDER — SODIUM CHLORIDE 0.9% IV SOLUTION
Freq: Once | INTRAVENOUS | Status: AC
  Administered 2021-04-05: 10 mL/h via INTRAVENOUS

## 2021-04-05 MED ORDER — ASCORBIC ACID 500 MG PO TABS
500.0000 mg | ORAL_TABLET | Freq: Every day | ORAL | Status: DC
  Administered 2021-04-05 – 2021-04-16 (×12): 500 mg via ORAL
  Filled 2021-04-05 (×12): qty 1

## 2021-04-05 MED ORDER — BISACODYL 10 MG RE SUPP
10.0000 mg | Freq: Once | RECTAL | Status: DC
  Filled 2021-04-05: qty 1

## 2021-04-05 MED ORDER — ZINC SULFATE 220 (50 ZN) MG PO CAPS
220.0000 mg | ORAL_CAPSULE | Freq: Every day | ORAL | Status: DC
  Administered 2021-04-05 – 2021-04-16 (×12): 220 mg via ORAL
  Filled 2021-04-05 (×12): qty 1

## 2021-04-05 MED ORDER — SODIUM CHLORIDE 0.9% FLUSH
10.0000 mL | Freq: Two times a day (BID) | INTRAVENOUS | Status: DC
  Administered 2021-04-05 – 2021-04-16 (×22): 10 mL

## 2021-04-05 MED ORDER — JUVEN PO PACK
1.0000 | PACK | Freq: Two times a day (BID) | ORAL | Status: DC
  Administered 2021-04-05 – 2021-04-16 (×24): 1 via ORAL
  Filled 2021-04-05 (×24): qty 1
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
## 2021-04-05 NOTE — Progress Notes (Addendum)
Initial Nutrition Assessment  DOCUMENTATION CODES:   Not applicable  INTERVENTION:   Ensure Enlive po BID, each supplement provides 350 kcal and 20 grams of protein.  Juven 1 packet PO BID, each packet provides 80 calories, 8 grams of carbohydrate, 2.5  grams of protein (collagen), 7 grams of L-arginine and 7 grams of L-glutamine; supplement contains CaHMB, Vitamins C, E, B12 and Zinc to promote wound healing.  Check vitamin D, vitamin A, vitamin C, zinc levels and supplement as necessary.   Change liquid MVI to MVI with minerals PO daily for a more complete vitamin.  Begin supplementation with vitamin C and zinc to support wound healing. Discussed with attending physician; will add 220 mg zinc and 500 mg vitamin C daily. Juven provides ~19 mg zinc and ~600 mg vitamin C daily. Total intakes will be appropriate for supplementation.  NUTRITION DIAGNOSIS:   Increased nutrient needs related to wound healing as evidenced by estimated needs.  GOAL:   Patient will meet greater than or equal to 90% of their needs  MONITOR:   PO intake, Supplement acceptance, Labs, Skin  REASON FOR ASSESSMENT:   Consult Assessment of nutrition requirement/status, Wound healing  ASSESSMENT:   45 yo female admitted with septic shock, osteomyelitis r/t foot, sacral, ischial tuberosity ulcers. PMH includes SCI, paraplegia, decubitus ulcers, osteoporosis, chronic urinary incontinence.  S/P I&D of sacral abscess 12/30. Packing was placed into wounds. Plans to remove packing 1/1.  Unable to speak with patient or complete nutrition focused physical exam at this time. RD working remotely. Patient has a history of weight loss per review of progress notes. Weight history reviewed; patient has gained weight since admission d/t edema. Patient with deep pitting edema to LLE and moderate pitting edema to RLE. Most recent weight available PTA is from 2016.   Patient is at risk for several vitamin and mineral  deficiencies that could impede wound healing. Recommend supplement vitamin C and zinc to support wound healing. Also recommend checking vitamin C, zinc, and vitamin A levels. Given hx of osteoporosis, recommend checking vitamin D level. Ionized calcium 1.1 (L) on 12/30. Discussed with attending physician.   Labs reviewed. Na 132, ionized calcium 1.1 Potassium 2.5 on admission, received KCl for supplementation. Magnesium 1.6 on 12/30, received mag sulfate for supplementation.  CBG: 122-110  Medications reviewed and include folic acid, Novolog, MVI liquid, Protonix, Miralax, thiamine, Levophed, IV antibiotics.  I/O +5.1 L since admission.  Admission weight 63.5 kg Current weight 74.3 kg  NUTRITION - FOCUSED PHYSICAL EXAM:  Unable to complete  Diet Order:   Diet Order             Diet regular Room service appropriate? Yes; Fluid consistency: Thin; Fluid restriction: Other (see comments)  Diet effective now                   EDUCATION NEEDS:   Not appropriate for education at this time  Skin:  Skin Assessment: Skin Integrity Issues: (heel, sacral, and bilateral ischial tuberosity pressure injuries)  Last BM:  no BM documented  Height:   Ht Readings from Last 1 Encounters:  04/04/21 5\' 6"  (1.676 m)    Weight:   Wt Readings from Last 1 Encounters:  04/05/21 74.3 kg    BMI:  Body mass index is 26.44 kg/m.  Estimated Nutritional Needs:   Kcal:  2100-2300  Protein:  110-130 gm  Fluid:  >/= 2 L    Lucas Mallow, RD, LDN, CNSC Please refer to [facility]  for contact information.

## 2021-04-05 NOTE — Progress Notes (Addendum)
1 Day Post-Op   Subjective/Chief Complaint: No acute changes over night   Objective: Vital signs in last 24 hours: Temp:  [95.4 F (35.2 C)-97.9 F (36.6 C)] 97.9 F (36.6 C) (12/31 0741) Pulse Rate:  [54-99] 62 (12/31 0700) Resp:  [0-26] 16 (12/31 0700) BP: (82-120)/(49-84) 105/74 (12/31 0700) SpO2:  [96 %-100 %] 98 % (12/31 0700) Weight:  [63.5 kg-74.3 kg] 74.3 kg (12/31 0500) Last BM Date:  (PTA)  Intake/Output from previous day: 12/30 0701 - 12/31 0700 In: 2956.6 [P.O.:480; I.V.:1716.7; IV Piggyback:759.9] Out: 800 [Urine:775; Blood:25] Intake/Output this shift: No intake/output data recorded.  Exam: Awake and alert Eating breakfast Packing in place  Lab Results:  Recent Labs    04/04/21 1515 04/05/21 0351  WBC 9.7 9.9  HGB 7.9* 7.0*  HCT 24.8* 22.6*  PLT 386 384   BMET Recent Labs    04/04/21 1515 04/05/21 0351  NA 134* 132*  K 3.2* 3.8  CL 96* 98  CO2 30 29  GLUCOSE 115* 111*  BUN <5* 6  CREATININE 0.33* 0.34*  CALCIUM 7.3* 7.1*   PT/INR Recent Labs    04/03/21 1711 04/04/21 1515  LABPROT 14.9 14.8  INR 1.2 1.2   ABG Recent Labs    04/03/21 1959  PHART 7.459*  HCO3 30.5*    Studies/Results: CT PELVIS W CONTRAST  Result Date: 04/04/2021 CLINICAL DATA:  Ulcers to bilateral buttocks. EXAM: CT PELVIS WITH CONTRAST TECHNIQUE: Multidetector CT imaging of the pelvis was performed using the standard protocol following the bolus administration of intravenous contrast. CONTRAST:  162mL OMNIPAQUE IOHEXOL 300 MG/ML  SOLN COMPARISON:  None. FINDINGS: Urinary Tract:  No abnormality visualized. Bowel: There is no evidence of bowel dilatation. The appendix is not clearly identified. A large amount of stool is seen throughout the colon. Vascular/Lymphatic: No pathologically enlarged lymph nodes. No significant vascular abnormality seen. Reproductive:  No mass or other significant abnormality Other: Marked severity anasarca is seen along the anterior and  lateral aspects of the abdominal and pelvic wall, with marked severity cellulitis seen throughout the proximal portions of the bilateral lower extremities. A 6.2 cm x 4.2 cm x 3.6 cm complex fluid collection and/or abscess is seen within the soft tissues posterior and inferior to the rectum (axial CT images 39 through 47, CT series 503). There is a small amount of posterior pelvic free fluid. Musculoskeletal: A total right hip replacement is seen with associated streak artifact and subsequently limited evaluation of the adjacent osseous and soft tissue structures. Marked severity chronic and degenerative changes are also noted within the left hip. A 4.9 cm x 2.3 cm x 2.7 cm soft tissue defect is seen along the posteromedial aspect of the proximal right lower extremity. This extends to the level of the femoral prosthesis of the previously noted right hip replacement marked severity associated soft tissue thickening, edema and subcutaneous inflammatory fat stranding is noted. A similar appearing, approximately 3.4 cm x 1.5 cm x 3.8 cm soft tissue defect, with associated edema and subcutaneous inflammatory fat stranding, is seen along the posterolateral aspect of the proximal left lower extremity. A 2.1 cm x 0.9 cm x 0.7 cm soft tissue defect is seen along the posterior aspect of the left inferior pubic ramus (axial CT images 41 through 45, CT series 503). There is no definite evidence of acute fracture or acute cortical destruction. IMPRESSION: 1. Marked severity anasarca along the anterior and lateral aspects of the abdominal and pelvic wall, with marked severity cellulitis seen  throughout the proximal portions of the bilateral lower extremities. 2. 6.2 cm x 4.2 cm x 3.6 cm complex fluid collection and/or abscess seen within the soft tissues posterior and inferior to the rectum. 3. Large soft tissue defects, with associated edema and subcutaneous inflammatory fat stranding, along the posteromedial aspects of the  bilateral lower extremities and posterior to the left inferior pubic ramus. While no definite evidence of associated acute cortical destruction is seen, associated acute osteomyelitis cannot be excluded. Further evaluation with a triple phase nuclear medicine bone scan is recommended. 4. Total right hip replacement. 5. Marked severity chronic and degenerative changes within the left hip. Electronically Signed   By: Virgina Norfolk M.D.   On: 04/04/2021 01:40   DG Pelvis Portable  Result Date: 04/03/2021 CLINICAL DATA:  Pain EXAM: PORTABLE PELVIS 1-2 VIEWS COMPARISON:  None. FINDINGS: There is previous right hip arthroplasty. Marked degenerative changes are noted in the left hip. There is possible cortical irregularity in the greater trochanter of proximal left femur. There is radiolucency in the soft tissues adjacent to the intertrochanteric portion of proximal left femur. This may suggest open wound in the skin. IMPRESSION: Severe degenerative changes are noted in the left hip. Cortical irregularity is seen in the greater trochanter of proximal left femur. This may be due to recent or old injury. There is radiolucency in the soft tissues adjacent to the intertrochanteric portion of proximal left femur. This may suggest open wound in the skin or infectious process in the soft tissues. There is previous right hip arthroplasty. Electronically Signed   By: Elmer Picker M.D.   On: 04/03/2021 18:45   CT FOOT LEFT W CONTRAST  Result Date: 04/04/2021 CLINICAL DATA:  Ulceration, swelling EXAM: CT OF THE LOWER LEFT EXTREMITY WITH CONTRAST TECHNIQUE: Multidetector CT imaging of the lower left extremity was performed according to the standard protocol following intravenous contrast administration. CONTRAST:  133mL OMNIPAQUE IOHEXOL 300 MG/ML  SOLN COMPARISON:  04/03/2021 FINDINGS: Bones/Joint/Cartilage There is a large soft tissue defect overlying the calcaneus, with the bone protruding through the defect.  There is cortical erosion and a small amount of gas along the inferior posterior margin the calcaneus diagnostic of osteomyelitis. There are no other acute bony abnormalities. Chronic posttraumatic changes are seen at the first interphalangeal joint. Periarticular osteopenia throughout the midfoot. Mild midfoot osteoarthritis. Ligaments Suboptimally assessed by CT. Muscles and Tendons No gross abnormalities. Soft tissues Large posterior soft tissue ulceration with protrusion of the calcaneus through the soft tissue defect. There is diffuse subcutaneous edema throughout the left foot and ankle. Broken linear metallic foreign body is seen within the plantar soft tissues of the midfoot, at the level of the tarsometatarsal joints. No fluid collection or abscess. Reconstructed images demonstrate no additional findings. IMPRESSION: 1. Large posterior soft tissue defect overlying the calcaneus, with underlying cortical destruction and intraosseous gas consistent with calcaneal osteomyelitis. 2. Diffuse soft tissue edema. 3. Fracture linear metallic foreign body within the plantar soft tissues of the midfoot, measuring up to 2.5 cm in total length. Electronically Signed   By: Randa Ngo M.D.   On: 04/04/2021 01:10   DG Chest Port 1 View  Result Date: 04/05/2021 CLINICAL DATA:  Sepsis. EXAM: PORTABLE CHEST 1 VIEW COMPARISON:  04/04/2021 at 2:39 a.m. FINDINGS: The lungs are hyperexpanded but clear. No pleural effusion is evident. Heart size and vasculature are normal. Right IJ central line tip again noted at the cavoatrial junction with no pneumothorax. Multiple overlying monitor wires. IMPRESSION: No  evidence of acute chest disease. Today there is resolution of previous findings of basilar subpleural septal lines/edema. There was a hazy infiltrate suspected developing in the left base which is also no longer seen. Hyperinflated chest. Electronically Signed   By: Telford Nab M.D.   On: 04/05/2021 06:23   DG  Chest Port 1 View  Result Date: 04/04/2021 CLINICAL DATA:  Central line placement. EXAM: PORTABLE CHEST 1 VIEW COMPARISON:  April 03, 2021 FINDINGS: The right internal jugular venous catheter is seen with its distal tip noted at the junction of the superior vena cava and right atrium. Very mild atelectasis versus early infiltrate is seen within the left lung base. There is no evidence of a pleural effusion or pneumothorax. A skin fold is suspected along the lateral aspect of the upper right lung. The heart size and mediastinal contours are within normal limits. The visualized skeletal structures are unremarkable. IMPRESSION: Right internal jugular venous catheter placement positioning, as described above. Electronically Signed   By: Virgina Norfolk M.D.   On: 04/04/2021 02:52   DG Chest Port 1 View  Result Date: 04/03/2021 CLINICAL DATA:  Questionable sepsis. EXAM: PORTABLE CHEST 1 VIEW COMPARISON:  None. FINDINGS: The heart size and mediastinal contours are within normal limits. No focal consolidation. No pleural effusion. No pneumothorax. The visualized skeletal structures are unremarkable. IMPRESSION: No active disease. Electronically Signed   By: Dahlia Bailiff M.D.   On: 04/03/2021 18:16   DG Foot Complete Left  Result Date: 04/03/2021 CLINICAL DATA:  Pain and swelling, wound in the skin EXAM: LEFT FOOT - COMPLETE 3+ VIEW COMPARISON:  None. FINDINGS: No definite recent fracture or dislocation is seen. There is a metallic foreign body in the shape of broken needle superimposed over distal row of tarsals. There is marked soft tissue swelling over the dorsum of the foot. There is low-attenuation in the soft tissues adjacent to the posterior aspect of calcaneus. Possibility of open wound in the skin should be considered. If there is clinical suspicion for osteomyelitis, follow-up three-phase bone scan or MRI should be considered. There is deformity in the proximal and distal phalanges of the big  toe, possibly residual from previous injury. IMPRESSION: No definite recent fracture or dislocation is seen. There are no focal lytic lesions. There is radiolucency in the soft tissues adjacent to the posterior aspect of calcaneus. This may be due to open wound in the skin or infectious process in the soft tissues. If there is clinical suspicion for osteomyelitis three-phase bone scan or MRI may be considered. There is a metallic foreign body in the shape of broken needle superimposed over the lateral cuneiform and cuboid. Electronically Signed   By: Elmer Picker M.D.   On: 04/03/2021 18:42   ECHOCARDIOGRAM COMPLETE  Result Date: 04/04/2021    ECHOCARDIOGRAM REPORT   Patient Name:   Kim [name] Date of Exam: 04/04/2021 Medical Rec #:  [id number]         Height:       66.0 in Accession #:    [id number]        Weight:       158.5 lb Date of Birth:  07-Jan-1976         BSA:          1.812 m Patient Age:    68 years          BP:           99/64 mmHg Patient Gender: F  HR:           85 bpm. Exam Location:  Inpatient Procedure: 2D Echo, Cardiac Doppler and Color Doppler Indications:    Other abnormalities of the heart  History:        Patient has no prior history of Echocardiogram examinations.                 Multiple skin abcess.  Sonographer:    Merrie Roof RDCS Referring Phys: [place]  1. Left ventricular ejection fraction, by estimation, is 60 to 65%. The left ventricle has normal function. The left ventricle has no regional wall motion abnormalities. Left ventricular diastolic parameters were normal.  2. Right ventricular systolic function is normal. The right ventricular size is normal. There is normal pulmonary artery systolic pressure. The estimated right ventricular systolic pressure is 26.8 mmHg.  3. The mitral valve is grossly normal. Trivial mitral valve regurgitation. No evidence of mitral stenosis.  4. The aortic valve is tricuspid. Aortic valve  regurgitation is not visualized. No aortic stenosis is present.  5. The inferior vena cava is normal in size with greater than 50% respiratory variability, suggesting right atrial pressure of 3 mmHg. Conclusion(s)/Recommendation(s): Normal biventricular function without evidence of hemodynamically significant valvular heart disease. FINDINGS  Left Ventricle: Left ventricular ejection fraction, by estimation, is 60 to 65%. The left ventricle has normal function. The left ventricle has no regional wall motion abnormalities. The left ventricular internal cavity size was normal in size. There is  no left ventricular hypertrophy. Left ventricular diastolic parameters were normal. Right Ventricle: The right ventricular size is normal. No increase in right ventricular wall thickness. Right ventricular systolic function is normal. There is normal pulmonary artery systolic pressure. The tricuspid regurgitant velocity is 2.67 m/s, and  with an assumed right atrial pressure of 3 mmHg, the estimated right ventricular systolic pressure is 34.1 mmHg. Left Atrium: Left atrial size was normal in size. Right Atrium: Right atrial size was normal in size. Pericardium: There is no evidence of pericardial effusion. Mitral Valve: The mitral valve is grossly normal. Trivial mitral valve regurgitation. No evidence of mitral valve stenosis. Tricuspid Valve: The tricuspid valve is grossly normal. Tricuspid valve regurgitation is trivial. No evidence of tricuspid stenosis. Aortic Valve: The aortic valve is tricuspid. Aortic valve regurgitation is not visualized. No aortic stenosis is present. Aortic valve mean gradient measures 4.0 mmHg. Aortic valve peak gradient measures 7.2 mmHg. Aortic valve area, by VTI measures 2.52 cm. Pulmonic Valve: The pulmonic valve was grossly normal. Pulmonic valve regurgitation is not visualized. No evidence of pulmonic stenosis. Aorta: The aortic root and ascending aorta are structurally normal, with no  evidence of dilitation. Venous: The right lower pulmonary vein is normal. The inferior vena cava is normal in size with greater than 50% respiratory variability, suggesting right atrial pressure of 3 mmHg. IAS/Shunts: The atrial septum is grossly normal.  LEFT VENTRICLE PLAX 2D LVIDd:         4.40 cm   Diastology LVIDs:         3.20 cm   LV e' medial:    11.90 cm/s LV PW:         0.90 cm   LV E/e' medial:  8.1 LV IVS:        0.80 cm   LV e' lateral:   17.10 cm/s LVOT diam:     2.00 cm   LV E/e' lateral: 5.6 LV SV:  67 LV SV Index:   37 LVOT Area:     3.14 cm  RIGHT VENTRICLE          IVC RV Basal diam:  2.90 cm  IVC diam: 1.70 cm LEFT ATRIUM             Index        RIGHT ATRIUM           Index LA diam:        4.10 cm 2.26 cm/m   RA Area:     16.00 cm LA Vol (A2C):   49.1 ml 27.10 ml/m  RA Volume:   47.80 ml  26.38 ml/m LA Vol (A4C):   37.5 ml 20.70 ml/m LA Biplane Vol: 44.1 ml 24.34 ml/m  AORTIC VALVE AV Area (Vmax):    2.41 cm AV Area (Vmean):   2.31 cm AV Area (VTI):     2.52 cm AV Vmax:           134.00 cm/s AV Vmean:          89.900 cm/s AV VTI:            0.267 m AV Peak Grad:      7.2 mmHg AV Mean Grad:      4.0 mmHg LVOT Vmax:         103.00 cm/s LVOT Vmean:        66.000 cm/s LVOT VTI:          0.214 m LVOT/AV VTI ratio: 0.80  AORTA Ao Root diam: 2.90 cm Ao Asc diam:  2.90 cm MITRAL VALVE               TRICUSPID VALVE MV Area (PHT): 4.17 cm    TR Peak grad:   28.5 mmHg MV Decel Time: 182 msec    TR Vmax:        267.00 cm/s MV E velocity: 96.00 cm/s MV A velocity: 89.50 cm/s  SHUNTS MV E/A ratio:  1.07        Systemic VTI:  0.21 m                            Systemic Diam: 2.00 cm Eleonore Chiquito MD Electronically signed by Eleonore Chiquito MD Signature Date/Time: 04/04/2021/4:14:18 PM    Final     Anti-infectives: Anti-infectives (From admission, onward)    Start     Dose/Rate Route Frequency Ordered Stop   04/04/21 1530  ceFEPIme (MAXIPIME) 2 g in sodium chloride 0.9 % 100 mL IVPB         2 g 200 mL/hr over 30 Minutes Intravenous Every 8 hours 04/04/21 1442     04/04/21 1530  metroNIDAZOLE (FLAGYL) IVPB 500 mg        500 mg 100 mL/hr over 60 Minutes Intravenous Every 12 hours 04/04/21 1442     04/04/21 0530  vancomycin (VANCOREADY) IVPB 750 mg/150 mL       See Hyperspace for full Linked Orders Report.   750 mg 150 mL/hr over 60 Minutes Intravenous Every 12 hours 04/03/21 1717     04/04/21 0245  piperacillin-tazobactam (ZOSYN) IVPB 3.375 g        3.375 g 100 mL/hr over 30 Minutes Intravenous  Once 04/04/21 0231 04/04/21 0316   04/04/21 0245  clindamycin (CLEOCIN) IVPB 900 mg        900 mg 100 mL/hr over 30 Minutes Intravenous  Once 04/04/21 0231 04/04/21 0316  04/03/21 1730  vancomycin (VANCOREADY) IVPB 1250 mg/250 mL       See Hyperspace for full Linked Orders Report.   1,250 mg 166.7 mL/hr over 90 Minutes Intravenous  Once 04/03/21 1717 04/03/21 1928   04/03/21 1715  vancomycin (VANCOCIN) IVPB 1000 mg/200 mL premix  Status:  Discontinued        1,000 mg 200 mL/hr over 60 Minutes Intravenous  Once 04/03/21 1711 04/03/21 1717   04/03/21 1715  cefTRIAXone (ROCEPHIN) 2 g in sodium chloride 0.9 % 100 mL IVPB        2 g 200 mL/hr over 30 Minutes Intravenous  Once 04/03/21 1711 04/03/21 1755       Assessment/Plan: s/p Procedure(s): INCISION AND DRAINAGE SACRAL ABSCESS (N/A)  Stable postop day #1 s/p drainage of perineal abscess, debridement of 11x4x2 cm of skin, soft tissue and muscle, [place] 12/30  Plan to remove packing tomorrow  Care per CCM Chronic anemia, may need transfusion  Coralie Keens MD 04/05/2021

## 2021-04-06 DIAGNOSIS — E871 Hypo-osmolality and hyponatremia: Secondary | ICD-10-CM

## 2021-04-06 LAB — TYPE AND SCREEN
ABO/RH(D): O NEG
Antibody Screen: NEGATIVE
Unit division: 0

## 2021-04-06 LAB — CBC
HCT: 26.2 % — ABNORMAL LOW (ref 36.0–46.0)
Hemoglobin: 8.4 g/dL — ABNORMAL LOW (ref 12.0–15.0)
MCH: 29.8 pg (ref 26.0–34.0)
MCHC: 32.1 g/dL (ref 30.0–36.0)
MCV: 92.9 fL (ref 80.0–100.0)
Platelets: 326 10*3/uL (ref 150–400)
RBC: 2.82 MIL/uL — ABNORMAL LOW (ref 3.87–5.11)
RDW: 15.4 % (ref 11.5–15.5)
WBC: 7.3 10*3/uL (ref 4.0–10.5)
nRBC: 0 % (ref 0.0–0.2)

## 2021-04-06 LAB — BASIC METABOLIC PANEL
Anion gap: 3 — ABNORMAL LOW (ref 5–15)
BUN: 11 mg/dL (ref 6–20)
CO2: 29 mmol/L (ref 22–32)
Calcium: 7.1 mg/dL — ABNORMAL LOW (ref 8.9–10.3)
Chloride: 102 mmol/L (ref 98–111)
Creatinine, Ser: 0.42 mg/dL — ABNORMAL LOW (ref 0.44–1.00)
GFR, Estimated: >60 mL/min (ref >60)
Glucose, Bld: 98 mg/dL (ref 70–99)
Potassium: 4 mmol/L (ref 3.5–5.1)
Sodium: 134 mmol/L — ABNORMAL LOW (ref 135–145)

## 2021-04-06 LAB — GLUCOSE, CAPILLARY
Glucose-Capillary: 88 mg/dL (ref 70–99)
Glucose-Capillary: 59 mg/dL — ABNORMAL LOW (ref 70–99)
Glucose-Capillary: 93 mg/dL (ref 70–99)
Glucose-Capillary: 93 mg/dL (ref 70–99)

## 2021-04-06 LAB — BPAM RBC
Blood Product Expiration Date: 202301042359
ISSUE DATE / TIME: 202212311433
Unit Type and Rh: 9500

## 2021-04-06 MED ORDER — IPRATROPIUM-ALBUTEROL 0.5-2.5 (3) MG/3ML IN SOLN
RESPIRATORY_TRACT | Status: AC
  Filled 2021-04-06: qty 3

## 2021-04-06 MED ORDER — BUPRENORPHINE HCL-NALOXONE HCL 8-2 MG SL SUBL
1.0000 | SUBLINGUAL_TABLET | Freq: Three times a day (TID) | SUBLINGUAL | Status: DC
  Administered 2021-04-06 – 2021-04-16 (×32): 1 via SUBLINGUAL
  Filled 2021-04-06 (×23): qty 1
  Filled 2021-04-06: qty 8
  Filled 2021-04-06 (×8): qty 1

## 2021-04-06 MED ORDER — VANCOMYCIN HCL 750 MG/150ML IV SOLN
750.0000 mg | Freq: Two times a day (BID) | INTRAVENOUS | Status: DC
  Administered 2021-04-06 – 2021-04-07 (×3): 750 mg via INTRAVENOUS
  Filled 2021-04-06 (×4): qty 150

## 2021-04-06 MED ORDER — INSULIN ASPART 100 UNIT/ML IJ SOLN
0.0000 [IU] | Freq: Three times a day (TID) | INTRAMUSCULAR | Status: DC
  Administered 2021-04-08: 2 [IU] via SUBCUTANEOUS

## 2021-04-06 MED ORDER — BUPRENORPHINE HCL-NALOXONE HCL 8-2 MG SL SUBL
1.0000 | SUBLINGUAL_TABLET | Freq: Two times a day (BID) | SUBLINGUAL | Status: DC
  Administered 2021-04-06: 1 via SUBLINGUAL
  Filled 2021-04-06: qty 1

## 2021-04-06 NOTE — Progress Notes (Signed)
Pharmacy Antibiotic Note  Kim [name] is a 46 y.o. female admitted on 04/03/2021 with sacral abscess and heel wound. Pharmacy has been consulted for Vancomycin dosing. Also on Cefepime and Metronidazole.   Proteus growing in sacral abscess but culture is still pending. Blood cultures are no growth to day. SCr remains low-stable. WBC is trending down. LA is trending down.  Plan: Continue Vancomycin 750 mg IV Q 12 hrs. Goal AUC 400-550. Expected AUC: 455 SCr used: 0.8(actual 0.42-stable)  Continue Cefepime 2g IV every 8 hours.  F/u cxs and clinical progress Monitor V/S, labs and levels as indicated  Height: 5\' 6"  (167.6 cm) Weight: 74.3 kg (163 lb 12.8 oz) IBW/kg (Calculated) : 59.3  Temp (24hrs), Avg:97.5 F (36.4 C), Min:96.6 F (35.9 C), Max:98 F (36.7 C)  Recent Labs  Lab 04/03/21 1603 04/03/21 1710 04/03/21 1912 04/04/21 1131 04/04/21 1515 04/05/21 0351 04/05/21 1259 04/06/21 0359  WBC 13.5*  --   --   --  9.7 9.9  --  7.3  CREATININE 0.44  --   --  0.30* 0.33* 0.34*  --  0.42*  LATICACIDVEN 3.2* 3.4* 3.7*  --   --   --  1.5  --     Estimated Creatinine Clearance: 93.5 mL/min (A) (by C-G formula based on SCr of 0.42 mg/dL (L)).    Allergies  Allergen Reactions   Keflex [Cephalexin] Diarrhea    Antimicrobials this admission: Vancomycin 12/29 >>  Ceftriaxone 12/29x1 Cefepime 12/30 >> Metronidazole 12/30 >>  Microbiology results: 12/29 BXI:DHWY x3 12/30 Sacral abscess >> proteus mirabilis (pan sensitive); *Still pending 12/30 MRSA PCR negative    Thank you for allowing pharmacy to be a part of this patients care.  Sloan Leiter, PharmD, BCPS, BCCCP Clinical Pharmacist Please refer to [facility] for [place] numbers 04/06/2021 1:10 PM
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
## 2021-04-06 NOTE — Progress Notes (Signed)
2 Days Post-Op   Subjective/Chief Complaint: No new complaints Packing came out last night and was changed.  Pt tolerated fairly well   Objective: Vital signs in last 24 hours: Temp:  [96.6 F (35.9 C)-98 F (36.7 C)] 98 F (36.7 C) (01/01 0802) Pulse Rate:  [66-101] 95 (01/01 0700) Resp:  [11-24] 16 (01/01 0700) BP: (86-115)/(45-83) 107/71 (01/01 0700) SpO2:  [95 %-100 %] 95 % (01/01 0700) Last BM Date: 04/01/21  Intake/Output from previous day: 12/31 0701 - 01/01 0700 In: 2836.9 [P.O.:1062; I.V.:423.3; Blood:630; IV Piggyback:691.6] Out: 375 [Urine:375] Intake/Output this shift: No intake/output data recorded.  Exam: Up eating breakfast Looks comfortable Dressings intact  Lab Results:  Recent Labs    04/05/21 0351 04/05/21 1959 04/06/21 0359  WBC 9.9  --  7.3  HGB 7.0* 8.4* 8.4*  HCT 22.6* 25.9* 26.2*  PLT 384  --  326   BMET Recent Labs    04/05/21 0351 04/05/21 0915 04/05/21 1959 04/06/21 0359  NA 132*   < > 131* 134*  K 3.8  --   --  4.0  CL 98  --   --  102  CO2 29  --   --  29  GLUCOSE 111*  --   --  98  BUN 6  --   --  11  CREATININE 0.34*  --   --  0.42*  CALCIUM 7.1*  --   --  7.1*   < > = values in this interval not displayed.   PT/INR Recent Labs    04/03/21 1711 04/04/21 1515  LABPROT 14.9 14.8  INR 1.2 1.2   ABG Recent Labs    04/03/21 1959  PHART 7.459*  HCO3 30.5*    Studies/Results: DG Chest Port 1 View  Result Date: 04/05/2021 CLINICAL DATA:  Sepsis. EXAM: PORTABLE CHEST 1 VIEW COMPARISON:  04/04/2021 at 2:39 a.m. FINDINGS: The lungs are hyperexpanded but clear. No pleural effusion is evident. Heart size and vasculature are normal. Right IJ central line tip again noted at the cavoatrial junction with no pneumothorax. Multiple overlying monitor wires. IMPRESSION: No evidence of acute chest disease. Today there is resolution of previous findings of basilar subpleural septal lines/edema. There was a hazy infiltrate  suspected developing in the left base which is also no longer seen. Hyperinflated chest. Electronically Signed   By: Telford Nab M.D.   On: 04/05/2021 06:23   ECHOCARDIOGRAM COMPLETE  Result Date: 04/04/2021    ECHOCARDIOGRAM REPORT   Patient Name:   Kim [name] Date of Exam: 04/04/2021 Medical Rec #:  [id number]         Height:       66.0 in Accession #:    [id number]        Weight:       158.5 lb Date of Birth:  Mar 31, 1976         BSA:          1.812 m Patient Age:    46 years          BP:           99/64 mmHg Patient Gender: F                 HR:           85 bpm. Exam Location:  Inpatient Procedure: 2D Echo, Cardiac Doppler and Color Doppler Indications:    Other abnormalities of the heart  History:        Patient has  no prior history of Echocardiogram examinations.                 Multiple skin abcess.  Sonographer:    Merrie Roof RDCS Referring Phys: [place]  1. Left ventricular ejection fraction, by estimation, is 60 to 65%. The left ventricle has normal function. The left ventricle has no regional wall motion abnormalities. Left ventricular diastolic parameters were normal.  2. Right ventricular systolic function is normal. The right ventricular size is normal. There is normal pulmonary artery systolic pressure. The estimated right ventricular systolic pressure is 74.0 mmHg.  3. The mitral valve is grossly normal. Trivial mitral valve regurgitation. No evidence of mitral stenosis.  4. The aortic valve is tricuspid. Aortic valve regurgitation is not visualized. No aortic stenosis is present.  5. The inferior vena cava is normal in size with greater than 50% respiratory variability, suggesting right atrial pressure of 3 mmHg. Conclusion(s)/Recommendation(s): Normal biventricular function without evidence of hemodynamically significant valvular heart disease. FINDINGS  Left Ventricle: Left ventricular ejection fraction, by estimation, is 60 to 65%. The left ventricle has  normal function. The left ventricle has no regional wall motion abnormalities. The left ventricular internal cavity size was normal in size. There is  no left ventricular hypertrophy. Left ventricular diastolic parameters were normal. Right Ventricle: The right ventricular size is normal. No increase in right ventricular wall thickness. Right ventricular systolic function is normal. There is normal pulmonary artery systolic pressure. The tricuspid regurgitant velocity is 2.67 m/s, and  with an assumed right atrial pressure of 3 mmHg, the estimated right ventricular systolic pressure is 81.4 mmHg. Left Atrium: Left atrial size was normal in size. Right Atrium: Right atrial size was normal in size. Pericardium: There is no evidence of pericardial effusion. Mitral Valve: The mitral valve is grossly normal. Trivial mitral valve regurgitation. No evidence of mitral valve stenosis. Tricuspid Valve: The tricuspid valve is grossly normal. Tricuspid valve regurgitation is trivial. No evidence of tricuspid stenosis. Aortic Valve: The aortic valve is tricuspid. Aortic valve regurgitation is not visualized. No aortic stenosis is present. Aortic valve mean gradient measures 4.0 mmHg. Aortic valve peak gradient measures 7.2 mmHg. Aortic valve area, by VTI measures 2.52 cm. Pulmonic Valve: The pulmonic valve was grossly normal. Pulmonic valve regurgitation is not visualized. No evidence of pulmonic stenosis. Aorta: The aortic root and ascending aorta are structurally normal, with no evidence of dilitation. Venous: The right lower pulmonary vein is normal. The inferior vena cava is normal in size with greater than 50% respiratory variability, suggesting right atrial pressure of 3 mmHg. IAS/Shunts: The atrial septum is grossly normal.  LEFT VENTRICLE PLAX 2D LVIDd:         4.40 cm   Diastology LVIDs:         3.20 cm   LV e' medial:    11.90 cm/s LV PW:         0.90 cm   LV E/e' medial:  8.1 LV IVS:        0.80 cm   LV e' lateral:    17.10 cm/s LVOT diam:     2.00 cm   LV E/e' lateral: 5.6 LV SV:         67 LV SV Index:   37 LVOT Area:     3.14 cm  RIGHT VENTRICLE          IVC RV Basal diam:  2.90 cm  IVC diam: 1.70 cm LEFT ATRIUM  Index        RIGHT ATRIUM           Index LA diam:        4.10 cm 2.26 cm/m   RA Area:     16.00 cm LA Vol (A2C):   49.1 ml 27.10 ml/m  RA Volume:   47.80 ml  26.38 ml/m LA Vol (A4C):   37.5 ml 20.70 ml/m LA Biplane Vol: 44.1 ml 24.34 ml/m  AORTIC VALVE AV Area (Vmax):    2.41 cm AV Area (Vmean):   2.31 cm AV Area (VTI):     2.52 cm AV Vmax:           134.00 cm/s AV Vmean:          89.900 cm/s AV VTI:            0.267 m AV Peak Grad:      7.2 mmHg AV Mean Grad:      4.0 mmHg LVOT Vmax:         103.00 cm/s LVOT Vmean:        66.000 cm/s LVOT VTI:          0.214 m LVOT/AV VTI ratio: 0.80  AORTA Ao Root diam: 2.90 cm Ao Asc diam:  2.90 cm MITRAL VALVE               TRICUSPID VALVE MV Area (PHT): 4.17 cm    TR Peak grad:   28.5 mmHg MV Decel Time: 182 msec    TR Vmax:        267.00 cm/s MV E velocity: 96.00 cm/s MV A velocity: 89.50 cm/s  SHUNTS MV E/A ratio:  1.07        Systemic VTI:  0.21 m                            Systemic Diam: 2.00 cm Eleonore Chiquito MD Electronically signed by Eleonore Chiquito MD Signature Date/Time: 04/04/2021/4:14:18 PM    Final     Anti-infectives: Anti-infectives (From admission, onward)    Start     Dose/Rate Route Frequency Ordered Stop   04/04/21 1530  ceFEPIme (MAXIPIME) 2 g in sodium chloride 0.9 % 100 mL IVPB        2 g 200 mL/hr over 30 Minutes Intravenous Every 8 hours 04/04/21 1442     04/04/21 1530  metroNIDAZOLE (FLAGYL) IVPB 500 mg        500 mg 100 mL/hr over 60 Minutes Intravenous Every 12 hours 04/04/21 1442     04/04/21 0530  vancomycin (VANCOREADY) IVPB 750 mg/150 mL       See Hyperspace for full Linked Orders Report.   750 mg 150 mL/hr over 60 Minutes Intravenous Every 12 hours 04/03/21 1717     04/04/21 0245  piperacillin-tazobactam (ZOSYN)  IVPB 3.375 g        3.375 g 100 mL/hr over 30 Minutes Intravenous  Once 04/04/21 0231 04/04/21 0316   04/04/21 0245  clindamycin (CLEOCIN) IVPB 900 mg        900 mg 100 mL/hr over 30 Minutes Intravenous  Once 04/04/21 0231 04/04/21 0316   04/03/21 1730  vancomycin (VANCOREADY) IVPB 1250 mg/250 mL       See Hyperspace for full Linked Orders Report.   1,250 mg 166.7 mL/hr over 90 Minutes Intravenous  Once 04/03/21 1717 04/03/21 1928   04/03/21 1715  vancomycin (VANCOCIN) IVPB 1000 mg/200 mL premix  Status:  Discontinued        1,000 mg 200 mL/hr over 60 Minutes Intravenous  Once 04/03/21 1711 04/03/21 1717   04/03/21 1715  cefTRIAXone (ROCEPHIN) 2 g in sodium chloride 0.9 % 100 mL IVPB        2 g 200 mL/hr over 30 Minutes Intravenous  Once 04/03/21 1711 04/03/21 1755       Assessment/Plan:  POD #2 s/p drainage of perineal abscess, debridement of 11x4x2 cm of skin, soft tissue and muscle, Donne Hazel 12/30  Continue wound care and antibiotics Will have wound care team see tomorrow, ? Hydrotherapy  Coralie Keens MD 04/06/2021

## 2021-04-07 DIAGNOSIS — G834 Cauda equina syndrome: Secondary | ICD-10-CM

## 2021-04-07 DIAGNOSIS — R6521 Severe sepsis with septic shock: Secondary | ICD-10-CM | POA: Diagnosis not present

## 2021-04-07 DIAGNOSIS — A419 Sepsis, unspecified organism: Secondary | ICD-10-CM | POA: Diagnosis not present

## 2021-04-07 DIAGNOSIS — L270 Generalized skin eruption due to drugs and medicaments taken internally: Secondary | ICD-10-CM

## 2021-04-07 LAB — BASIC METABOLIC PANEL
Anion gap: 4 — ABNORMAL LOW (ref 5–15)
BUN: 12 mg/dL (ref 6–20)
CO2: 30 mmol/L (ref 22–32)
Calcium: 6.8 mg/dL — ABNORMAL LOW (ref 8.9–10.3)
Chloride: 101 mmol/L (ref 98–111)
Creatinine, Ser: <0.3 mg/dL — ABNORMAL LOW (ref 0.44–1.00)
Glucose, Bld: 95 mg/dL (ref 70–99)
Potassium: 3.8 mmol/L (ref 3.5–5.1)
Sodium: 135 mmol/L (ref 135–145)

## 2021-04-07 LAB — PHOSPHORUS: Phosphorus: 2.6 mg/dL (ref 2.5–4.6)

## 2021-04-07 LAB — CBC
HCT: 23.4 % — ABNORMAL LOW (ref 36.0–46.0)
Hemoglobin: 7.2 g/dL — ABNORMAL LOW (ref 12.0–15.0)
MCH: 29.3 pg (ref 26.0–34.0)
MCHC: 30.8 g/dL (ref 30.0–36.0)
MCV: 95.1 fL (ref 80.0–100.0)
Platelets: 255 10*3/uL (ref 150–400)
RBC: 2.46 MIL/uL — ABNORMAL LOW (ref 3.87–5.11)
RDW: 15.9 % — ABNORMAL HIGH (ref 11.5–15.5)
WBC: 6.1 10*3/uL (ref 4.0–10.5)
nRBC: 0 % (ref 0.0–0.2)

## 2021-04-07 LAB — GLUCOSE, CAPILLARY
Glucose-Capillary: 94 mg/dL (ref 70–99)
Glucose-Capillary: 107 mg/dL — ABNORMAL HIGH (ref 70–99)

## 2021-04-07 LAB — VANCOMYCIN, RANDOM: Vancomycin Rm: 14

## 2021-04-07 LAB — VANCOMYCIN, PEAK: Vancomycin Pk: 26 ug/mL — ABNORMAL LOW (ref 30–40)

## 2021-04-07 LAB — MAGNESIUM: Magnesium: 1.8 mg/dL (ref 1.7–2.4)

## 2021-04-07 MED ORDER — DIPHENHYDRAMINE HCL 50 MG/ML IJ SOLN: 25.0000 mg | Freq: Four times a day (QID) | INTRAMUSCULAR | Status: DC | PRN

## 2021-04-07 MED ORDER — POTASSIUM CHLORIDE CRYS ER 20 MEQ PO TBCR
40.0000 meq | EXTENDED_RELEASE_TABLET | Freq: Once | ORAL | Status: AC
  Administered 2021-04-07: 40 meq via ORAL
  Filled 2021-04-07: qty 2

## 2021-04-07 MED ORDER — SENNA 8.6 MG PO TABS
2.0000 | ORAL_TABLET | Freq: Every day | ORAL | Status: DC
  Administered 2021-04-08 – 2021-04-09 (×2): 17.2 mg via ORAL
  Filled 2021-04-07 (×7): qty 2

## 2021-04-07 MED ORDER — MAGNESIUM SULFATE 2 GM/50ML IV SOLN
2.0000 g | Freq: Once | INTRAVENOUS | Status: AC
  Administered 2021-04-07: 2 g via INTRAVENOUS
  Filled 2021-04-07: qty 50

## 2021-04-07 MED ORDER — DIPHENHYDRAMINE HCL 25 MG PO CAPS: 25.0000 mg | ORAL_CAPSULE | Freq: Four times a day (QID) | ORAL | Status: DC | PRN

## 2021-04-07 MED ORDER — POLYETHYLENE GLYCOL 3350 17 G PO PACK
17.0000 g | PACK | Freq: Two times a day (BID) | ORAL | Status: DC
  Administered 2021-04-07 – 2021-04-09 (×5): 17 g via ORAL
  Filled 2021-04-07 (×12): qty 1

## 2021-04-07 MED ORDER — SODIUM CHLORIDE 0.9 % IV SOLN
3.0000 g | Freq: Four times a day (QID) | INTRAVENOUS | Status: DC
  Administered 2021-04-07 – 2021-04-09 (×8): 3 g via INTRAVENOUS
  Filled 2021-04-07 (×8): qty 8

## 2021-04-07 MED ORDER — COLLAGENASE 250 UNIT/GM EX OINT
TOPICAL_OINTMENT | Freq: Every day | CUTANEOUS | Status: DC
  Filled 2021-04-07: qty 30

## 2021-04-07 MED ORDER — VANCOMYCIN HCL IN DEXTROSE 1-5 GM/200ML-% IV SOLN
1000.0000 mg | Freq: Two times a day (BID) | INTRAVENOUS | Status: DC
  Administered 2021-04-07 – 2021-04-08 (×2): 1000 mg via INTRAVENOUS
  Filled 2021-04-07 (×3): qty 200

## 2021-04-07 MED ORDER — HYDROXYZINE HCL 25 MG PO TABS: 25.0000 mg | ORAL_TABLET | Freq: Three times a day (TID) | ORAL | Status: DC | PRN

## 2021-04-07 MED ORDER — HYDROCORTISONE 1 % EX CREA
TOPICAL_CREAM | Freq: Two times a day (BID) | CUTANEOUS | Status: DC
  Administered 2021-04-07 – 2021-04-08 (×2): 1 via TOPICAL
  Filled 2021-04-07 (×2): qty 28

## 2021-04-07 MED ORDER — VANCOMYCIN HCL IN DEXTROSE 1-5 GM/200ML-% IV SOLN
1000.0000 mg | Freq: Two times a day (BID) | INTRAVENOUS | Status: DC
  Filled 2021-04-07: qty 200

## 2021-04-07 NOTE — Consult Note (Signed)
Camp Nurse Consult Note: Reason for Consult: ? Need for hydrotherapy Patient with chronic pressure injuries related to paraplegia since 1990. She has an unstageable pressure injury on the left heel that has been evaluated by Dr. Sharol Given. General surgery took patient to the OR 04/04/21 for drainage of a perineal abscess and debridement of the ischial wounds.  Wound type: Left heel; Unstageable pressure injury: 75% necrotic/25% pink, pale, hypergranulation Left ischium: Stage 4 pressure injury; 100%  clean s/p surgical debridement; tunneling and undermining present; 4cm x 6cm x 6cm  Left ischium(medial): Stage 4 pressure injury: 100% clean s/p surgical debridement: tunneling present; 3cm x 3cm x 6cm  Right ischium: Stage 4 pressure injury: 100% clean s/p surgical debridement; tunneling present Ischial wounds each have palpable bone present  5. Perineal wound; distal left labia majora; tunneling; unable to assess base of wound Pressure Injury POA: Yes Measurement:see nursing flowsheet and above  Wound bed: see above  Drainage (amount, consistency, odor) moderate from the ischial wounds, serosanguinous, non purulent  Periwound: intact; evidence of previous wounds over the sacrum and buttocks  Dressing procedure/placement/frequency: Continue saline moist to moist packing to the bilateral ischial wounds; change BID  Add enzymatic debridement ointment to the left heel wound, saline moist gauze and kerlix. Change daily Use Prevalon boots to offload heels; stressed importance to patient RD consultation for supplementation for wound healing Secure chat to the CM regarding home needs (offloading) Silicone foam to the sacrum; prevention in high risk patient  LALM in place while in the ICU for moisture management and pressure redistribution Will need air mattress if transferred out of the ICU Discussed rationale for pressure redistribution at length with patient related to left heel and ischial wounds.  Patient self reports she has an air mattress at home. Has HHA several hours per day and a 66 year old son at home.  Explained the complex needs of her wounds; maximize nutrition. She has been recommended for HBO per patient (from outside facility). Discussed surgical intervention from plastics however would need to have much improved nutrition and maximum support at home for consideration.   Discussed POC with patient and bedside nurse.  Re consult if needed, will not follow at this time. Thanks  Antoinne Spadaccini [employer], RN,CWOCN, CNS, [place] [phone])

## 2021-04-07 NOTE — Progress Notes (Signed)
3 Days Post-Op   Subjective/Chief Complaint: Reports that pain is minimal.  Dressing was changed this morning.   Objective: Vital signs in last 24 hours: Temp:  [97.9 F (36.6 C)-98.9 F (37.2 C)] 98.4 F (36.9 C) (01/02 0400) Pulse Rate:  [93-133] 100 (01/02 0800) Resp:  [12-30] 15 (01/02 0800) BP: (57-123)/(30-103) 100/69 (01/02 0800) SpO2:  [58 %-100 %] 99 % (01/02 0800) Weight:  [79 kg] 79 kg (01/02 0344) Last BM Date: 04/01/21  Intake/Output from previous day: 01/01 0701 - 01/02 0700 In: 818.6 [I.V.:97.8; IV Piggyback:720.8] Out: 1775 [Urine:1775] Intake/Output this shift: Total I/O In: 100 [IV Piggyback:100] Out: -   Exam: Up eating breakfast Looks comfortable Dressings intact  Lab Results:  Recent Labs    04/06/21 0359 04/07/21 0329  WBC 7.3 6.1  HGB 8.4* 7.2*  HCT 26.2* 23.4*  PLT 326 255    BMET Recent Labs    04/06/21 0359 04/07/21 0329  NA 134* 135  K 4.0 3.8  CL 102 101  CO2 29 30  GLUCOSE 98 95  BUN 11 12  CREATININE 0.42* <0.30*  CALCIUM 7.1* 6.8*    PT/INR Recent Labs    04/04/21 1515  LABPROT 14.8  INR 1.2    ABG No results for input(s): PHART, HCO3 in the last 72 hours.  Invalid input(s): PCO2, PO2   Studies/Results: No results found.  Anti-infectives: Anti-infectives (From admission, onward)    Start     Dose/Rate Route Frequency Ordered Stop   04/06/21 1115  vancomycin (VANCOREADY) IVPB 750 mg/150 mL       See Hyperspace for full Linked Orders Report.   750 mg 150 mL/hr over 60 Minutes Intravenous Every 12 hours 04/06/21 1115     04/04/21 1530  ceFEPIme (MAXIPIME) 2 g in sodium chloride 0.9 % 100 mL IVPB        2 g 200 mL/hr over 30 Minutes Intravenous Every 8 hours 04/04/21 1442     04/04/21 1530  metroNIDAZOLE (FLAGYL) IVPB 500 mg        500 mg 100 mL/hr over 60 Minutes Intravenous Every 12 hours 04/04/21 1442     04/04/21 0530  vancomycin (VANCOREADY) IVPB 750 mg/150 mL  Status:  Discontinued       See  Hyperspace for full Linked Orders Report.   750 mg 150 mL/hr over 60 Minutes Intravenous Every 12 hours 04/03/21 1717 04/06/21 1115   04/04/21 0245  piperacillin-tazobactam (ZOSYN) IVPB 3.375 g        3.375 g 100 mL/hr over 30 Minutes Intravenous  Once 04/04/21 0231 04/04/21 0316   04/04/21 0245  clindamycin (CLEOCIN) IVPB 900 mg        900 mg 100 mL/hr over 30 Minutes Intravenous  Once 04/04/21 0231 04/04/21 0316   04/03/21 1730  vancomycin (VANCOREADY) IVPB 1250 mg/250 mL       See Hyperspace for full Linked Orders Report.   1,250 mg 166.7 mL/hr over 90 Minutes Intravenous  Once 04/03/21 1717 04/03/21 1928   04/03/21 1715  vancomycin (VANCOCIN) IVPB 1000 mg/200 mL premix  Status:  Discontinued        1,000 mg 200 mL/hr over 60 Minutes Intravenous  Once 04/03/21 1711 04/03/21 1717   04/03/21 1715  cefTRIAXone (ROCEPHIN) 2 g in sodium chloride 0.9 % 100 mL IVPB        2 g 200 mL/hr over 30 Minutes Intravenous  Once 04/03/21 1711 04/03/21 1755       Assessment/Plan:  POD #  3 s/p drainage of perineal abscess, debridement of 11x4x2 cm of skin, soft tissue and muscle, Kim [name] 12/30  Continue wound care and antibiotics Consult wound care, may be candidate for hydrotherapy  Clovis Riley MD 04/07/2021
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
## 2021-04-07 NOTE — Progress Notes (Addendum)
Pharmacy Antibiotic Note  Kim [name] is a 46 y.o. female with paraplegia who was admitted on 04/03/2021 with multiple pressure injuries of the left heel, left ischium, right ischium, perineal wound and cortical destruction of the left heel consistent with calcaneal osteomyelitis; pelvic osteomyelitis; and perirectal abscess. Pt is S/P debridement on 04/04/21. Pharmacy was consulted for vancomycin dosing. Pt had also been receiving cefepime and metronidazole, which were changed to Unasyn today by ID.  WBC 6.1, afebrile; Scr <0.30, CrCl >> 100 mL/min  Steady-state vancomycin levels drawn after the 6th dose of vancomycin 750 mg IV Q 12 hr regimen (dose given at 1157 AM today) were as follows: Vancomycin peak (drawn at 1330 PM today): 26 mg/L Vancomycin random (drawn at 1853 PM today): 14 mg/L Vancomycin AUC calculated from the above levels is 381.7, which is below the goal AUC range of 400-550)  Plan: Change vancomycin regimen to 1 gm IV Q 12  hrs (estimated AUC on this regimen is 508; goal vancomycin AUC is 400-550; give first dose of new regimen now, as vancomycin level is 14 mg/L) Monitor WBC, temp, clinical course, cultures, renal function, vancomycin levels as indicated  Height: 5\' 6"  (167.6 cm) Weight: 79 kg (174 lb 2.6 oz) IBW/kg (Calculated) : 59.3  Temp (24hrs), Avg:98.4 F (36.9 C), Min:98.3 F (36.8 C), Max:98.4 F (36.9 C)  Recent Labs  Lab 04/03/21 1603 04/03/21 1710 04/03/21 1912 04/04/21 1131 04/04/21 1515 04/05/21 0351 04/05/21 1259 04/06/21 0359 04/07/21 0329 04/07/21 1330 04/07/21 1853  WBC 13.5*  --   --   --  9.7 9.9  --  7.3 6.1  --   --   CREATININE 0.44  --   --  0.30* 0.33* 0.34*  --  0.42* <0.30*  --   --   LATICACIDVEN 3.2* 3.4* 3.7*  --   --   --  1.5  --   --   --   --   VANCOPEAK  --   --   --   --   --   --   --   --   --  26*  --   VANCORANDOM  --   --   --   --   --   --   --   --   --   --  14     CrCl cannot be calculated (This lab  value cannot be used to calculate CrCl because it is not a number: <0.30).    Allergies  Allergen Reactions   Keflex [Cephalexin] Diarrhea    Antimicrobials this admission: Vancomycin 12/29 >>  Ceftriaxone X 1 12/29 Cefepime 12/30 >>1/2 Metronidazole 12/30 >>1/2 Ampicillin/sulbactam 1/2 >>  Microbiology results: 12/29 COVID, flu A, flu B: negative 12/30 HIV screen: negative 12/29 Bld cx X 2: NGTD (4 days) 12/30 Sacral abscess:  rare Proteus mirabilis (pan sensitive); moderate Bacteroides vulgatus; cx re-incubated for better growth 12/30 MRSA PCR: negative   Thank you for allowing pharmacy to be a part of this patients care.  Gillermina Hu, PharmD, BCPS, [facility] Clinical Pharmacist 04/07/2021 8:13 PM

## 2021-04-07 NOTE — Progress Notes (Signed)
Pt transferred to 5w14 from [place]. A&O x4, VS stable, Tele monitor verified. IV R & L AC, SL. All questions/concerns addressed.  Call bell within reach. Will cont to monitor.    04/07/21 1855  Vitals  Temp 98.4 F (36.9 C)  Temp Source Oral  BP 102/63  MAP (mmHg) 75  BP Location Left Wrist  BP Method Automatic  Patient Position (if appropriate) Sitting  Pulse Rate (!) 109  Resp 19  Level of Consciousness  Level of Consciousness Alert  MEWS COLOR  MEWS Score Color Green  Oxygen Therapy  SpO2 98 %  O2 Device Room Air  Pain Assessment  Pain Score 0  MEWS Score  MEWS Temp 0  MEWS Systolic 0  MEWS Pulse 1  MEWS RR 0  MEWS LOC 0  MEWS Score 1
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
## 2021-04-08 DIAGNOSIS — E43 Unspecified severe protein-calorie malnutrition: Secondary | ICD-10-CM

## 2021-04-08 DIAGNOSIS — E440 Moderate protein-calorie malnutrition: Secondary | ICD-10-CM | POA: Insufficient documentation

## 2021-04-08 DIAGNOSIS — A419 Sepsis, unspecified organism: Secondary | ICD-10-CM

## 2021-04-08 DIAGNOSIS — R6521 Severe sepsis with septic shock: Secondary | ICD-10-CM | POA: Diagnosis not present

## 2021-04-08 LAB — BASIC METABOLIC PANEL
Anion gap: 3 — ABNORMAL LOW (ref 5–15)
BUN: 9 mg/dL (ref 6–20)
CO2: 30 mmol/L (ref 22–32)
Calcium: 6.8 mg/dL — ABNORMAL LOW (ref 8.9–10.3)
Chloride: 101 mmol/L (ref 98–111)
Creatinine, Ser: 0.37 mg/dL — ABNORMAL LOW (ref 0.44–1.00)
GFR, Estimated: >60 mL/min (ref >60)
Glucose, Bld: 97 mg/dL (ref 70–99)
Potassium: 3.7 mmol/L (ref 3.5–5.1)
Sodium: 134 mmol/L — ABNORMAL LOW (ref 135–145)

## 2021-04-08 LAB — CULTURE, BLOOD (ROUTINE X 2)
Culture: NO GROWTH
Culture: NO GROWTH
Special Requests: ADEQUATE
Special Requests: ADEQUATE

## 2021-04-08 LAB — GLUCOSE, CAPILLARY
Glucose-Capillary: 106 mg/dL — ABNORMAL HIGH (ref 70–99)
Glucose-Capillary: 92 mg/dL (ref 70–99)
Glucose-Capillary: 148 mg/dL — ABNORMAL HIGH (ref 70–99)
Glucose-Capillary: 135 mg/dL — ABNORMAL HIGH (ref 70–99)

## 2021-04-08 LAB — AEROBIC/ANAEROBIC CULTURE W GRAM STAIN (SURGICAL/DEEP WOUND)

## 2021-04-08 LAB — CBC
HCT: 23.9 % — ABNORMAL LOW (ref 36.0–46.0)
Hemoglobin: 7.5 g/dL — ABNORMAL LOW (ref 12.0–15.0)
MCH: 29.5 pg (ref 26.0–34.0)
MCHC: 31.4 g/dL (ref 30.0–36.0)
MCV: 94.1 fL (ref 80.0–100.0)
Platelets: 259 10*3/uL (ref 150–400)
RBC: 2.54 MIL/uL — ABNORMAL LOW (ref 3.87–5.11)
RDW: 15.7 % — ABNORMAL HIGH (ref 11.5–15.5)
WBC: 6.8 10*3/uL (ref 4.0–10.5)
nRBC: 0 % (ref 0.0–0.2)

## 2021-04-08 LAB — VITAMIN A: Vitamin A (Retinoic Acid): 5.9 ug/dL — ABNORMAL LOW (ref 20.1–62.0)

## 2021-04-08 LAB — ZINC: Zinc: 32 ug/dL — ABNORMAL LOW (ref 44–115)

## 2021-04-08 LAB — LACTIC ACID, PLASMA: Lactic Acid, Venous: 1.3 mmol/L (ref 0.5–1.9)

## 2021-04-08 MED ORDER — VANCOMYCIN HCL IN DEXTROSE 1-5 GM/200ML-% IV SOLN
1000.0000 mg | Freq: Two times a day (BID) | INTRAVENOUS | Status: DC
  Administered 2021-04-08: 1000 mg via INTRAVENOUS
  Filled 2021-04-08 (×2): qty 200

## 2021-04-08 NOTE — Discharge Instructions (Addendum)
Follow with Primary MD Sharion Balloon, FNP in 7 days   Get CBC, CMP,  checked  by Primary MD next visit.    Activity: As tolerated with Full fall precautions use walker/cane & assistance as needed   Disposition Home    Diet: Regular diet   On your next visit with your primary care physician please Get Medicines reviewed and adjusted.   Please request your Prim.MD to go over all Hospital Tests and Procedure/Radiological results at the follow up, please get all Hospital records sent to your Prim MD by signing hospital release before you go home.   If you experience worsening of your admission symptoms, develop shortness of breath, life threatening emergency, suicidal or homicidal thoughts you must seek medical attention immediately by calling 911 or calling your MD immediately  if symptoms less severe.  You Must read complete instructions/literature along with all the possible adverse reactions/side effects for all the Medicines you take and that have been prescribed to you. Take any new Medicines after you have completely understood and accpet all the possible adverse reactions/side effects.   Do not drive, operating heavy machinery, perform activities at heights, swimming or participation in water activities or provide baby sitting services if your were admitted for syncope or siezures until you have seen by Primary MD or a Neurologist and advised to do so again.  Do not drive when taking Pain medications.    Do not take more than prescribed Pain, Sleep and Anxiety Medications  Special Instructions: If you have smoked or chewed Tobacco  in the last 2 yrs please stop smoking, stop any regular Alcohol  and or any Recreational drug use.  Wear Seat belts while driving.   Please note  You were cared for by a hospitalist during your hospital stay. If you have any questions about your discharge medications or the care you received while you were in the hospital after you are  discharged, you can call the unit and asked to speak with the hospitalist on call if the hospitalist that took care of you is not available. Once you are discharged, your primary care physician will handle any further medical issues. Please note that NO REFILLS for any discharge medications will be authorized once you are discharged, as it is imperative that you return to your primary care physician (or establish a relationship with a primary care physician if you do not have one) for your aftercare needs so that they can reassess your need for medications and monitor your lab values.
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
## 2021-04-08 NOTE — Progress Notes (Signed)
eLink Physician-Brief Progress Note Patient Name: Kim [name] DOB: November 13, 1975 MRN: [id number]   Date of Service  04/08/2021  HPI/Events of Note  Notified of BP 95/60 SBP 90s most of the day  No sign of hypoperfusion. Spoke with bedside RN  eICU Interventions  Included lactic acid to am labs Elink to be informed if this is elevated     Intervention Category Intermediate Interventions: Hypotension - evaluation and management  Judd Lien 04/08/2021, 3:52 AM

## 2021-04-08 NOTE — Progress Notes (Signed)
4 Days Post-Op   Subjective/Chief Complaint: Reports that pain is minimal.  Dressing was changed this morning.   Objective: Vital signs in last 24 hours: Temp:  [97.6 F (36.4 C)-98.4 F (36.9 C)] 97.6 F (36.4 C) (01/03 0738) Pulse Rate:  [81-112] 87 (01/03 0738) Resp:  [11-21] 19 (01/03 0738) BP: (66-108)/(57-82) 95/63 (01/03 0738) SpO2:  [92 %-100 %] 97 % (01/03 0738) Last BM Date: 04/01/21  Intake/Output from previous day: 01/02 0701 - 01/03 0700 In: 344.7 [P.O.:240; IV Piggyback:104.7] Out: 2600 [Urine:2600] Intake/Output this shift: No intake/output data recorded.  Exam: Skin: all wounds are clean with good beefy granulation tissue.  No purulent drainage noted.    Lab Results:  Recent Labs    04/07/21 0329 04/08/21 0117  WBC 6.1 6.8  HGB 7.2* 7.5*  HCT 23.4* 23.9*  PLT 255 259   BMET Recent Labs    04/07/21 0329 04/08/21 0117  NA 135 134*  K 3.8 3.7  CL 101 101  CO2 30 30  GLUCOSE 95 97  BUN 12 9  CREATININE <0.30* 0.37*  CALCIUM 6.8* 6.8*   PT/INR No results for input(s): LABPROT, INR in the last 72 hours. ABG No results for input(s): PHART, HCO3 in the last 72 hours.  Invalid input(s): PCO2, PO2   Studies/Results: No results found.  Anti-infectives: Anti-infectives (From admission, onward)    Start     Dose/Rate Route Frequency Ordered Stop   04/07/21 2100  vancomycin (VANCOCIN) IVPB 1000 mg/200 mL premix  Status:  Discontinued       See Hyperspace for full Linked Orders Report.   1,000 mg 200 mL/hr over 60 Minutes Intravenous Every 12 hours 04/07/21 2023 04/07/21 2023   04/07/21 2100  vancomycin (VANCOCIN) IVPB 1000 mg/200 mL premix        1,000 mg 200 mL/hr over 60 Minutes Intravenous Every 12 hours 04/07/21 2025     04/07/21 1400  Ampicillin-Sulbactam (UNASYN) 3 g in sodium chloride 0.9 % 100 mL IVPB        3 g 200 mL/hr over 30 Minutes Intravenous Every 6 hours 04/07/21 0959     04/06/21 1115  vancomycin (VANCOREADY) IVPB 750  mg/150 mL  Status:  Discontinued       See Hyperspace for full Linked Orders Report.   750 mg 150 mL/hr over 60 Minutes Intravenous Every 12 hours 04/06/21 1115 04/07/21 2023   04/04/21 1530  ceFEPIme (MAXIPIME) 2 g in sodium chloride 0.9 % 100 mL IVPB  Status:  Discontinued        2 g 200 mL/hr over 30 Minutes Intravenous Every 8 hours 04/04/21 1442 04/07/21 0959   04/04/21 1530  metroNIDAZOLE (FLAGYL) IVPB 500 mg  Status:  Discontinued        500 mg 100 mL/hr over 60 Minutes Intravenous Every 12 hours 04/04/21 1442 04/07/21 0959   04/04/21 0530  vancomycin (VANCOREADY) IVPB 750 mg/150 mL  Status:  Discontinued       See Hyperspace for full Linked Orders Report.   750 mg 150 mL/hr over 60 Minutes Intravenous Every 12 hours 04/03/21 1717 04/06/21 1115   04/04/21 0245  piperacillin-tazobactam (ZOSYN) IVPB 3.375 g        3.375 g 100 mL/hr over 30 Minutes Intravenous  Once 04/04/21 0231 04/04/21 0316   04/04/21 0245  clindamycin (CLEOCIN) IVPB 900 mg        900 mg 100 mL/hr over 30 Minutes Intravenous  Once 04/04/21 0231 04/04/21 0316   04/03/21  1730  vancomycin (VANCOREADY) IVPB 1250 mg/250 mL       See Hyperspace for full Linked Orders Report.   1,250 mg 166.7 mL/hr over 90 Minutes Intravenous  Once 04/03/21 1717 04/03/21 1928   04/03/21 1715  vancomycin (VANCOCIN) IVPB 1000 mg/200 mL premix  Status:  Discontinued        1,000 mg 200 mL/hr over 60 Minutes Intravenous  Once 04/03/21 1711 04/03/21 1717   04/03/21 1715  cefTRIAXone (ROCEPHIN) 2 g in sodium chloride 0.9 % 100 mL IVPB        2 g 200 mL/hr over 30 Minutes Intravenous  Once 04/03/21 1711 04/03/21 1755       Assessment/Plan: POD #4 s/p drainage of perineal abscess, debridement of 11x4x2 cm of skin, soft tissue and muscle, Donne Hazel 12/30 -Continue local wound care -antibiotics per ID recommendations for osteomyelitis -no further need for surgical debridement warranted -she will need to follow up with the [facility] wound  center upon discharge for management of these chronic wounds -we will sign off at this time.  Henreitta Cea PA-C 04/08/2021

## 2021-04-08 NOTE — Progress Notes (Signed)
°   04/07/21 2040  Assess: MEWS Score  Temp 98 F (36.7 C)  BP (!) 97/59  Pulse Rate (!) 109  ECG Heart Rate (!) 109  Resp 16  SpO2 96 %  Assess: MEWS Score  MEWS Temp 0  MEWS Systolic 1  MEWS Pulse 1  MEWS RR 0  MEWS LOC 0  MEWS Score 2  MEWS Score Color Yellow  Assess: if the MEWS score is Yellow or Red  Were vital signs taken at a resting state? Yes  Focused Assessment No change from prior assessment  Early Detection of Sepsis Score *See Row Information* Low  MEWS guidelines implemented *See Row Information* Yes  Treat  MEWS Interventions Other (Comment) (assessed patient)  Pain Scale 0-10  Pain Score 0  Escalate  MEWS: Escalate Yellow: discuss with charge nurse/RN and consider discussing with provider and RRT  Notify: Charge Nurse/RN  Name of Charge Nurse/RN Notified Lind Guest. RN  Date Charge Nurse/RN Notified 04/07/21  Time Charge Nurse/RN Notified 2042  Notify: Provider  Provider Name/Title Arelia Sneddon  Date Provider Notified 04/07/21  Notification Type Call  Notification Reason  (BP low, Pulse high no symptoms, patient is stable)  Provider response No new orders (MD said, if no symptoms  continue assessment)  Date of Provider Response 04/07/21

## 2021-04-08 NOTE — Progress Notes (Signed)
°   04/08/21 1928  Assess: MEWS Score  Temp 98.5 F (36.9 C)  BP (!) 108/55  Pulse Rate (!) 127  ECG Heart Rate (!) 126  Resp 18  SpO2 100 %  Assess: MEWS Score  MEWS Temp 0  MEWS Systolic 0  MEWS Pulse 2  MEWS RR 0  MEWS LOC 0  MEWS Score 2  MEWS Score Color Yellow  Assess: if the MEWS score is Yellow or Red  Were vital signs taken at a resting state? Yes  Focused Assessment No change from prior assessment  Early Detection of Sepsis Score *See Row Information* Low  MEWS guidelines implemented *See Row Information* Yes  Treat  Pain Scale 0-10  Pain Score 0  Take Vital Signs  Increase Vital Sign Frequency  Yellow: Q 2hr X 2 then Q 4hr X 2, if remains yellow, continue Q 4hrs  Escalate  MEWS: Escalate Yellow: discuss with charge nurse/RN and consider discussing with provider and RRT  Notify: Charge Nurse/RN  Name of Charge Nurse/RN Notified Chrostopher Y. RN  Date Charge Nurse/RN Notified 04/08/21  Time Charge Nurse/RN Notified 1929  Notify: Provider  Provider Name/Title J. Howerter  Date Provider Notified 04/08/21  Notification Type Page  Notification Reason Change in status (Pulse high 118,127)  Provider response No new orders  Date of Provider Response 04/08/21

## 2021-04-08 NOTE — Care Management Important Message (Signed)
Important Message  Patient Details  Name: Kim [name] MRN: [id number] Date of Birth: 1975-06-08   Medicare Important Message Given:  Yes     Berdine Rasmusson 04/08/2021, 2:40 PM
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
## 2021-04-09 ENCOUNTER — Inpatient Hospital Stay (HOSPITAL_COMMUNITY): Payer: Medicare Other

## 2021-04-09 DIAGNOSIS — R6521 Severe sepsis with septic shock: Secondary | ICD-10-CM | POA: Diagnosis not present

## 2021-04-09 DIAGNOSIS — M810 Age-related osteoporosis without current pathological fracture: Secondary | ICD-10-CM

## 2021-04-09 DIAGNOSIS — A419 Sepsis, unspecified organism: Secondary | ICD-10-CM | POA: Diagnosis not present

## 2021-04-09 DIAGNOSIS — L0390 Cellulitis, unspecified: Secondary | ICD-10-CM | POA: Diagnosis not present

## 2021-04-09 LAB — TYPE AND SCREEN
ABO/RH(D): O NEG
Antibody Screen: NEGATIVE

## 2021-04-09 LAB — CBC
HCT: 23.8 % — ABNORMAL LOW (ref 36.0–46.0)
Hemoglobin: 7.5 g/dL — ABNORMAL LOW (ref 12.0–15.0)
MCH: 30 pg (ref 26.0–34.0)
MCHC: 31.5 g/dL (ref 30.0–36.0)
MCV: 95.2 fL (ref 80.0–100.0)
Platelets: 269 10*3/uL (ref 150–400)
RBC: 2.5 MIL/uL — ABNORMAL LOW (ref 3.87–5.11)
RDW: 15.8 % — ABNORMAL HIGH (ref 11.5–15.5)
WBC: 7.2 10*3/uL (ref 4.0–10.5)
nRBC: 0 % (ref 0.0–0.2)

## 2021-04-09 LAB — BASIC METABOLIC PANEL
Anion gap: 4 — ABNORMAL LOW (ref 5–15)
BUN: 9 mg/dL (ref 6–20)
CO2: 29 mmol/L (ref 22–32)
Calcium: 7 mg/dL — ABNORMAL LOW (ref 8.9–10.3)
Chloride: 104 mmol/L (ref 98–111)
Creatinine, Ser: 0.41 mg/dL — ABNORMAL LOW (ref 0.44–1.00)
GFR, Estimated: >60 mL/min (ref >60)
Glucose, Bld: 90 mg/dL (ref 70–99)
Potassium: 4 mmol/L (ref 3.5–5.1)
Sodium: 137 mmol/L (ref 135–145)

## 2021-04-09 LAB — GLUCOSE, CAPILLARY
Glucose-Capillary: 119 mg/dL — ABNORMAL HIGH (ref 70–99)
Glucose-Capillary: 93 mg/dL (ref 70–99)
Glucose-Capillary: 99 mg/dL (ref 70–99)

## 2021-04-09 LAB — VITAMIN C: Vitamin C: 0.7 mg/dL (ref 0.4–2.0)

## 2021-04-09 MED ORDER — AMOXICILLIN-POT CLAVULANATE 875-125 MG PO TABS
1.0000 | ORAL_TABLET | Freq: Two times a day (BID) | ORAL | Status: DC
  Administered 2021-04-09 – 2021-04-16 (×16): 1 via ORAL
  Filled 2021-04-09 (×16): qty 1

## 2021-04-09 MED ORDER — NICOTINE 21 MG/24HR TD PT24: 21.0000 mg | MEDICATED_PATCH | Freq: Every day | TRANSDERMAL | Status: DC

## 2021-04-09 MED ORDER — ALUM & MAG HYDROXIDE-SIMETH 200-200-20 MG/5ML PO SUSP
30.0000 mL | Freq: Three times a day (TID) | ORAL | Status: DC | PRN
  Administered 2021-04-09 – 2021-04-12 (×5): 30 mL via ORAL
  Filled 2021-04-09 (×5): qty 30

## 2021-04-09 MED ORDER — VITAMIN A 3 MG (10000 UNIT) PO CAPS
10000.0000 [IU] | ORAL_CAPSULE | Freq: Every day | ORAL | Status: DC
  Administered 2021-04-09 – 2021-04-16 (×8): 10000 [IU] via ORAL
  Filled 2021-04-09 (×9): qty 1

## 2021-04-09 MED ORDER — VANCOMYCIN HCL IN DEXTROSE 1-5 GM/200ML-% IV SOLN
1000.0000 mg | Freq: Two times a day (BID) | INTRAVENOUS | Status: DC
  Administered 2021-04-09 – 2021-04-10 (×4): 1000 mg via INTRAVENOUS
  Filled 2021-04-09 (×5): qty 200

## 2021-04-09 MED ORDER — LACTATED RINGERS IV BOLUS
500.0000 mL | Freq: Once | INTRAVENOUS | Status: AC
  Administered 2021-04-09: 500 mL via INTRAVENOUS

## 2021-04-09 MED ORDER — MIDODRINE HCL 5 MG PO TABS
10.0000 mg | ORAL_TABLET | Freq: Three times a day (TID) | ORAL | Status: DC
  Administered 2021-04-09 – 2021-04-16 (×23): 10 mg via ORAL
  Filled 2021-04-09 (×23): qty 2

## 2021-04-09 MED ORDER — NICOTINE 21 MG/24HR TD PT24
21.0000 mg | MEDICATED_PATCH | Freq: Every day | TRANSDERMAL | Status: DC
  Administered 2021-04-09 – 2021-04-16 (×8): 21 mg via TRANSDERMAL
  Filled 2021-04-09 (×8): qty 1

## 2021-04-09 NOTE — Progress Notes (Signed)
Nutrition Brief Note   RD reached out to MD to check copper levels due to long-term zinc supplementation at home and risk of being deficient. Received ok from MD. Lab ordered.   Pt micronutrient labs came back and pt is deficient in Vitamin A and Zinc; pt started on appropriate supplementation.   RD will continue to follow pt.  Roxana Hires, RD, LDN Clinical Dietitian See [facility] for contact information.
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
## 2021-04-10 DIAGNOSIS — R6521 Severe sepsis with septic shock: Secondary | ICD-10-CM | POA: Diagnosis not present

## 2021-04-10 DIAGNOSIS — A419 Sepsis, unspecified organism: Secondary | ICD-10-CM | POA: Diagnosis not present

## 2021-04-10 LAB — GLUCOSE, CAPILLARY
Glucose-Capillary: 88 mg/dL (ref 70–99)
Glucose-Capillary: 119 mg/dL — ABNORMAL HIGH (ref 70–99)
Glucose-Capillary: 96 mg/dL (ref 70–99)
Glucose-Capillary: 122 mg/dL — ABNORMAL HIGH (ref 70–99)
Glucose-Capillary: 108 mg/dL — ABNORMAL HIGH (ref 70–99)

## 2021-04-10 LAB — VITAMIN B12: Vitamin B-12: 420 pg/mL (ref 180–914)

## 2021-04-10 LAB — CBC
HCT: 24 % — ABNORMAL LOW (ref 36.0–46.0)
Hemoglobin: 7.3 g/dL — ABNORMAL LOW (ref 12.0–15.0)
MCH: 29.4 pg (ref 26.0–34.0)
MCHC: 30.4 g/dL (ref 30.0–36.0)
MCV: 96.8 fL (ref 80.0–100.0)
Platelets: 249 10*3/uL (ref 150–400)
RBC: 2.48 MIL/uL — ABNORMAL LOW (ref 3.87–5.11)
RDW: 16.4 % — ABNORMAL HIGH (ref 11.5–15.5)
WBC: 6.8 10*3/uL (ref 4.0–10.5)
nRBC: 0 % (ref 0.0–0.2)

## 2021-04-10 LAB — IRON AND TIBC
Iron: 52 ug/dL (ref 28–170)
Saturation Ratios: 42 % — ABNORMAL HIGH (ref 10.4–31.8)
TIBC: 125 ug/dL — ABNORMAL LOW (ref 250–450)
UIBC: 73 ug/dL

## 2021-04-10 LAB — BASIC METABOLIC PANEL
Anion gap: 1 — ABNORMAL LOW (ref 5–15)
BUN: 12 mg/dL (ref 6–20)
CO2: 32 mmol/L (ref 22–32)
Calcium: 7.3 mg/dL — ABNORMAL LOW (ref 8.9–10.3)
Chloride: 103 mmol/L (ref 98–111)
Creatinine, Ser: 0.47 mg/dL (ref 0.44–1.00)
GFR, Estimated: >60 mL/min (ref >60)
Glucose, Bld: 97 mg/dL (ref 70–99)
Potassium: 4 mmol/L (ref 3.5–5.1)
Sodium: 136 mmol/L (ref 135–145)

## 2021-04-10 LAB — FERRITIN: Ferritin: 255 ng/mL (ref 11–307)

## 2021-04-10 LAB — RETICULOCYTES
Immature Retic Fract: 30.7 % — ABNORMAL HIGH (ref 2.3–15.9)
RBC.: 2.46 MIL/uL — ABNORMAL LOW (ref 3.87–5.11)
Retic Count, Absolute: 102.8 10*3/uL (ref 19.0–186.0)
Retic Ct Pct: 4.2 % — ABNORMAL HIGH (ref 0.4–3.1)

## 2021-04-10 LAB — FOLATE: Folate: 15.2 ng/mL (ref >5.9)

## 2021-04-10 MED ORDER — PROSOURCE PLUS PO LIQD
30.0000 mL | Freq: Two times a day (BID) | ORAL | Status: DC
  Administered 2021-04-10 – 2021-04-16 (×14): 30 mL via ORAL
  Filled 2021-04-10 (×14): qty 30

## 2021-04-10 MED ORDER — FERROUS SULFATE 325 (65 FE) MG PO TABS
325.0000 mg | ORAL_TABLET | Freq: Two times a day (BID) | ORAL | Status: DC
  Administered 2021-04-10 – 2021-04-16 (×14): 325 mg via ORAL
  Filled 2021-04-10 (×14): qty 1

## 2021-04-10 MED ORDER — DOCUSATE SODIUM 100 MG PO CAPS
100.0000 mg | ORAL_CAPSULE | Freq: Two times a day (BID) | ORAL | Status: DC
  Filled 2021-04-10 (×9): qty 1
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
## 2021-04-10 NOTE — Consult Note (Signed)
WOC consulted for wounds, see note from this week. Patient seen 04/07/21, orders in the computer for wounds.  Not surprised drainage increased at times with history of osteomyelitis in the presence of chronic wounds.  Verifying low air loss mattress ordered when patient transferred from the ICU.   Re consult if needed, will not follow at this time. Thanks  Marki Frede [employer], RN,CWOCN, CNS, [place] [phone])
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
## 2021-04-10 NOTE — Progress Notes (Signed)
Patient ID: Kim [name], female   DOB: 09/23/1975, 46 y.o.   MRN: [id number] [name] Progress Note  6 Days Post-Op  Subjective: CC-  Called to reevaluate wounds due to copious drainage. WBC WNL, afebrile.  Objective: Vital signs in last 24 hours: Temp:  [97.5 F (36.4 C)-98.4 F (36.9 C)] 97.5 F (36.4 C) (01/05 0757) Pulse Rate:  [94-108] 98 (01/05 0757) Resp:  [15-21] 21 (01/05 0757) BP: (79-103)/(49-74) 103/62 (01/05 0757) SpO2:  [91 %-100 %] 98 % (01/05 0757) Weight:  [84.1 kg] 84.1 kg (01/05 0343) Last BM Date: 04/09/21  Intake/Output from previous day: 01/04 0701 - 01/05 0700 In: 960 [P.O.:760; IV Piggyback:200] Out: 2000 [Urine:2000] Intake/Output this shift: No intake/output data recorded.  PE: Skin: all wounds are clean with good beefy granulation tissue, trace fibrinous tissue. No purulent drainage noted.        Lab Results:  Recent Labs    04/09/21 0051 04/10/21 0149  WBC 7.2 6.8  HGB 7.5* 7.3*  HCT 23.8* 24.0*  PLT 269 249   BMET Recent Labs    04/09/21 0051 04/10/21 0149  NA 137 136  K 4.0 4.0  CL 104 103  CO2 29 32  GLUCOSE 90 97  BUN 9 12  CREATININE 0.41* 0.47  CALCIUM 7.0* 7.3*   PT/INR No results for input(s): LABPROT, INR in the last 72 hours. CMP     Component Value Date/Time   NA 136 04/10/2021 0149   NA 136 11/28/2015 1639   K 4.0 04/10/2021 0149   CL 103 04/10/2021 0149   CO2 32 04/10/2021 0149   GLUCOSE 97 04/10/2021 0149   BUN 12 04/10/2021 0149   BUN 7 11/28/2015 1639   CREATININE 0.47 04/10/2021 0149   CALCIUM 7.3 (L) 04/10/2021 0149   PROT 5.0 (L) 04/04/2021 1515   PROT 7.3 11/28/2015 1639   ALBUMIN <1.5 (L) 04/04/2021 1515   ALBUMIN 3.9 11/28/2015 1639   AST 13 (L) 04/04/2021 1515   ALT 11 04/04/2021 1515   ALKPHOS 114 04/04/2021 1515   BILITOT 0.5 04/04/2021 1515   BILITOT 0.4 11/28/2015 1639   GFRNONAA >60 04/10/2021 0149   GFRAA 112 11/28/2015 1639   Lipase  No results found  for: LIPASE     Studies/Results: VAS [place] ABI WITH/WO TBI  Result Date: 04/09/2021  LOWER EXTREMITY DOPPLER STUDY Patient Name:  Kim [name]  Date of Exam:   04/09/2021 Medical Rec #: [id number]          Accession #:    [id number] Date of Birth: 1975-04-17          Patient Gender: F Patient Age:   24 years Exam Location:  [facility] Procedure:      VAS [place] ABI WITH/WO TBI Referring Phys: [facility] [place] --------------------------------------------------------------------------------  Indications: Ulceration, and History of paraplegia from spinal cord injury,              ducubitus ulcers, osteoporosis. High Risk Factors: Current smoker.  Performing Technologist: Oda Cogan RDMS, RVT  Examination Guidelines: A complete evaluation includes at minimum, Doppler waveform signals and systolic blood pressure reading at the level of bilateral brachial, anterior tibial, and posterior tibial arteries, when vessel segments are accessible. Bilateral testing is considered an integral part of a complete examination. Photoelectric Plethysmograph (PPG) waveforms and toe systolic pressure readings are included as required and additional duplex testing as needed. Limited examinations for reoccurring indications may be performed as noted.  ABI Findings: +---------+------------------+-----+---------+--------+  Right     Rt Pressure (mmHg) Index Waveform  Comment   +---------+------------------+-----+---------+--------+  Brachial  98                       triphasic           +---------+------------------+-----+---------+--------+  PTA       96                 0.98  triphasic           +---------+------------------+-----+---------+--------+  DP        83                 0.85  triphasic           +---------+------------------+-----+---------+--------+  Great Toe 75                 0.77                      +---------+------------------+-----+---------+--------+ +---------+------------------+-----+---------+-------+  Left       Lt Pressure (mmHg) Index Waveform  Comment  +---------+------------------+-----+---------+-------+  Brachial  98                       triphasic          +---------+------------------+-----+---------+-------+  PTA       96                 0.98  biphasic           +---------+------------------+-----+---------+-------+  DP        91                 0.93  biphasic           +---------+------------------+-----+---------+-------+  Great Toe 70                 0.71                     +---------+------------------+-----+---------+-------+ +-------+-----------+-----------+------------+------------+  ABI/TBI Today's ABI Today's TBI Previous ABI Previous TBI  +-------+-----------+-----------+------------+------------+  Right   0.98        0.77                                   +-------+-----------+-----------+------------+------------+  Left    0.98        0.71                                   +-------+-----------+-----------+------------+------------+  Summary: Right: Resting right ankle-brachial index is within normal range. No evidence of significant right lower extremity arterial disease. The right toe-brachial index is normal. Left: Resting left ankle-brachial index is within normal range. No evidence of significant left lower extremity arterial disease. The left toe-brachial index is normal.  *See table(s) above for measurements and observations.  Electronically signed by Harold Barban MD on 04/09/2021 at 11:31:54 PM.    Final     Anti-infectives: Anti-infectives (From admission, onward)    Start     Dose/Rate Route Frequency Ordered Stop   04/09/21 1200  amoxicillin-clavulanate (AUGMENTIN) 875-125 MG per tablet 1 tablet        1 tablet Oral Every 12 hours 04/09/21 0934     04/09/21 1100  vancomycin (VANCOCIN) IVPB 1000 mg/200 mL premix  1,000 mg 200 mL/hr over 60 Minutes Intravenous Every 12 hours 04/09/21 0948     04/08/21 2100  vancomycin (VANCOCIN) IVPB 1000 mg/200 mL premix  Status:   Discontinued        1,000 mg 200 mL/hr over 60 Minutes Intravenous Every 12 hours 04/08/21 1606 04/09/21 0921   04/07/21 2100  vancomycin (VANCOCIN) IVPB 1000 mg/200 mL premix  Status:  Discontinued       See Hyperspace for full Linked Orders Report.   1,000 mg 200 mL/hr over 60 Minutes Intravenous Every 12 hours 04/07/21 2023 04/07/21 2023   04/07/21 2100  vancomycin (VANCOCIN) IVPB 1000 mg/200 mL premix  Status:  Discontinued        1,000 mg 200 mL/hr over 60 Minutes Intravenous Every 12 hours 04/07/21 2025 04/08/21 1547   04/07/21 1400  Ampicillin-Sulbactam (UNASYN) 3 g in sodium chloride 0.9 % 100 mL IVPB  Status:  Discontinued        3 g 200 mL/hr over 30 Minutes Intravenous Every 6 hours 04/07/21 0959 04/09/21 0934   04/06/21 1115  vancomycin (VANCOREADY) IVPB 750 mg/150 mL  Status:  Discontinued       See Hyperspace for full Linked Orders Report.   750 mg 150 mL/hr over 60 Minutes Intravenous Every 12 hours 04/06/21 1115 04/07/21 2023   04/04/21 1530  ceFEPIme (MAXIPIME) 2 g in sodium chloride 0.9 % 100 mL IVPB  Status:  Discontinued        2 g 200 mL/hr over 30 Minutes Intravenous Every 8 hours 04/04/21 1442 04/07/21 0959   04/04/21 1530  metroNIDAZOLE (FLAGYL) IVPB 500 mg  Status:  Discontinued        500 mg 100 mL/hr over 60 Minutes Intravenous Every 12 hours 04/04/21 1442 04/07/21 0959   04/04/21 0530  vancomycin (VANCOREADY) IVPB 750 mg/150 mL  Status:  Discontinued       See Hyperspace for full Linked Orders Report.   750 mg 150 mL/hr over 60 Minutes Intravenous Every 12 hours 04/03/21 1717 04/06/21 1115   04/04/21 0245  piperacillin-tazobactam (ZOSYN) IVPB 3.375 g        3.375 g 100 mL/hr over 30 Minutes Intravenous  Once 04/04/21 0231 04/04/21 0316   04/04/21 0245  clindamycin (CLEOCIN) IVPB 900 mg        900 mg 100 mL/hr over 30 Minutes Intravenous  Once 04/04/21 0231 04/04/21 0316   04/03/21 1730  vancomycin (VANCOREADY) IVPB 1250 mg/250 mL       See Hyperspace for  full Linked Orders Report.   1,250 mg 166.7 mL/hr over 90 Minutes Intravenous  Once 04/03/21 1717 04/03/21 1928   04/03/21 1715  vancomycin (VANCOCIN) IVPB 1000 mg/200 mL premix  Status:  Discontinued        1,000 mg 200 mL/hr over 60 Minutes Intravenous  Once 04/03/21 1711 04/03/21 1717   04/03/21 1715  cefTRIAXone (ROCEPHIN) 2 g in sodium chloride 0.9 % 100 mL IVPB        2 g 200 mL/hr over 30 Minutes Intravenous  Once 04/03/21 1711 04/03/21 1755        Assessment/Plan POD #6 s/p drainage of perineal abscess, debridement of 11x4x2 cm of skin, soft tissue and muscle, Donne Hazel 12/30 -Continue local wound care -antibiotics per ID recommendations for osteomyelitis -no purulent drainage noted. no further need for surgical debridement warranted -she will need to follow up with the [name] upon discharge for management of these chronic wounds -we will sign off at  this time.   LOS: 6 days    Wellington Hampshire, [name] 04/10/2021, 11:22 AM Please see Amion for pager number during day hours 7:00am-4:30pm
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
## 2021-04-11 DIAGNOSIS — E43 Unspecified severe protein-calorie malnutrition: Secondary | ICD-10-CM | POA: Diagnosis not present

## 2021-04-11 DIAGNOSIS — A419 Sepsis, unspecified organism: Secondary | ICD-10-CM | POA: Diagnosis not present

## 2021-04-11 DIAGNOSIS — M86472 Chronic osteomyelitis with draining sinus, left ankle and foot: Secondary | ICD-10-CM

## 2021-04-11 DIAGNOSIS — R6521 Severe sepsis with septic shock: Secondary | ICD-10-CM | POA: Diagnosis not present

## 2021-04-11 LAB — GLUCOSE, CAPILLARY
Glucose-Capillary: 76 mg/dL (ref 70–99)
Glucose-Capillary: 104 mg/dL — ABNORMAL HIGH (ref 70–99)
Glucose-Capillary: 115 mg/dL — ABNORMAL HIGH (ref 70–99)
Glucose-Capillary: 319 mg/dL — ABNORMAL HIGH (ref 70–99)

## 2021-04-11 LAB — VANCOMYCIN, PEAK: Vancomycin Pk: 26 ug/mL — ABNORMAL LOW (ref 30–40)

## 2021-04-11 MED ORDER — FUROSEMIDE 40 MG PO TABS
40.0000 mg | ORAL_TABLET | Freq: Once | ORAL | Status: AC
  Administered 2021-04-11: 40 mg via ORAL
  Filled 2021-04-11: qty 1

## 2021-04-11 MED ORDER — ALBUMIN HUMAN 25 % IV SOLN
25.0000 g | Freq: Four times a day (QID) | INTRAVENOUS | Status: AC
  Administered 2021-04-11 – 2021-04-12 (×3): 25 g via INTRAVENOUS
  Filled 2021-04-11 (×3): qty 100
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
## 2021-04-11 NOTE — Progress Notes (Signed)
Patient Kim [name] 78.  Patient given orange juice.  She is asymptomatic.

## 2021-04-11 NOTE — TOC Initial Note (Signed)
Transition of Care [facility]) - Initial/Assessment Note    Patient Details  Name: Kim [name] MRN: [id number] Date of Birth: [birth year]  [facility] CM/SW Contact:    Angelita Ingles, RN Phone Number:[phone]  04/11/2021, 2:53 PM  Clinical Narrative:                 TOC following  patient admitted from home where she lives with her son. Patient states that she does have a [place] aide that provides care 7a-1p daily and son assist with care at other times. Patient reports that she lives in a [place] home that is handicap accessible. Reported DME includes hospital bed, wheelchair, shower chair, bedside commode, bedside table and other equipment that patient states that she cant remember everything. Patient states that she has everything that she needs at home and is able to manage. PCP is Benedetto Coons at [facility] and  pharmacy is [facility]. There are currently no needs but TOC will continue to follow.   Expected Discharge Plan: [place] Barriers to Discharge: Continued Medical Work up   Patient Goals and CMS Choice Patient states their goals for this hospitalization and ongoing recovery are:: Wants to go home CMS Medicare.gov Compare Post Acute Care list provided to::  (n/a) Choice offered to / list presented to : NA  Expected Discharge Plan and Services Expected Discharge Plan: [place] In-house Referral: NA Discharge Planning Services: CM Consult Post Acute Care Choice: NA Living arrangements for the past 2 months: Single Family Home                 DME Arranged: N/A DME Agency: NA       HH Arranged: NA HH Agency: NA        Prior Living Arrangements/Services Living arrangements for the past 2 months: Single Family Home Lives with:: Adult Children Patient language and need for interpreter reviewed:: Yes Do you feel safe going back to the place where you live?: Yes      Need for Family Participation in Patient  Care: Yes (Comment) Care giver support system in place?: Yes (comment) Current home services: Other (comment) (PCS services) Criminal Activity/Legal Involvement Pertinent to Current Situation/Hospitalization: No - Comment as needed  Activities of Daily Living      Permission Sought/Granted   Permission granted to share information with : No              Emotional Assessment Appearance:: Appears stated age Attitude/Demeanor/Rapport: Gracious Affect (typically observed): Pleasant Orientation: : Oriented to Self, Oriented to Place, Oriented to  Time, Oriented to Situation Alcohol / Substance Use: Not Applicable Psych Involvement: No (comment)  Admission diagnosis:  Septic shock ([place]) [A41.9, R65.21] Severe sepsis ([place]) [A41.9, R65.20] Pressure injury, stage 4, unspecified location ([place]) [L89.94] Sepsis ([place]) [A41.9] Patient Active Problem List   Diagnosis Date Noted   Chronic osteomyelitis of left foot with draining sinus ([place])    Severe protein-calorie malnutrition ([place]) 04/08/2021   Septic shock ([place]) 04/04/2021   Sepsis ([place]) 04/04/2021   Pressure injury of contiguous region involving right buttock and hip, stage 4 ([place]) 02/10/2021   Pressure injury of left heel, stage 4 ([place]) 02/10/2021   Non-healing open wound of heel, initial encounter 04/12/2020   Encounter for routine gynecological examination with Papanicolaou smear of cervix 03/03/2016   Enlarged uterus 03/03/2016   Postmenopausal 03/03/2016   GAD (generalized anxiety disorder) 01/21/2016   Osteopenia    Vitamin D  deficiency 05/01/2014   Heterotopic ossification of bone 08/02/2013   Hip pain, bilateral 08/02/2013   Osteoarthritis of left knee 08/02/2013   Cauda equina spinal cord injury ([place]) 06/16/2013   KNEE PAIN 05/23/2007   SPONDYLOSIS WITH MYELOPATHY LUMBAR REGION 05/23/2007   PCP:  Sharion Balloon, FNP Pharmacy:   [place], [place]  [address] Phone: [phone] Fax: [phone]     Social Determinants of Health (SDOH) Interventions    Readmission Risk Interventions No flowsheet data found.

## 2021-04-11 NOTE — Progress Notes (Signed)
Pateint refused dressing changes stating "they are ok.  They are not wet or soaking thru."  Educated patient on need to have dressing changes BID as ordered by MD

## 2021-04-12 LAB — MAGNESIUM: Magnesium: 1.9 mg/dL (ref 1.7–2.4)

## 2021-04-12 LAB — COPPER, SERUM: Copper: 75 ug/dL — ABNORMAL LOW (ref 80–158)

## 2021-04-12 LAB — BASIC METABOLIC PANEL
Anion gap: 5 (ref 5–15)
BUN: 12 mg/dL (ref 6–20)
CO2: 30 mmol/L (ref 22–32)
Calcium: 7.7 mg/dL — ABNORMAL LOW (ref 8.9–10.3)
Chloride: 103 mmol/L (ref 98–111)
Creatinine, Ser: 0.61 mg/dL (ref 0.44–1.00)
GFR, Estimated: >60 mL/min (ref >60)
Glucose, Bld: 87 mg/dL (ref 70–99)
Potassium: 3.7 mmol/L (ref 3.5–5.1)
Sodium: 138 mmol/L (ref 135–145)

## 2021-04-12 LAB — GLUCOSE, CAPILLARY
Glucose-Capillary: 79 mg/dL (ref 70–99)
Glucose-Capillary: 124 mg/dL — ABNORMAL HIGH (ref 70–99)
Glucose-Capillary: 118 mg/dL — ABNORMAL HIGH (ref 70–99)
Glucose-Capillary: 99 mg/dL (ref 70–99)

## 2021-04-12 MED ORDER — ALBUMIN HUMAN 25 % IV SOLN
25.0000 g | Freq: Four times a day (QID) | INTRAVENOUS | Status: AC
  Administered 2021-04-12 (×2): 25 g via INTRAVENOUS
  Filled 2021-04-12 (×2): qty 100

## 2021-04-12 MED ORDER — POTASSIUM CHLORIDE CRYS ER 20 MEQ PO TBCR
40.0000 meq | EXTENDED_RELEASE_TABLET | Freq: Once | ORAL | Status: AC
  Administered 2021-04-12: 40 meq via ORAL
  Filled 2021-04-12: qty 2

## 2021-04-12 MED ORDER — MAGNESIUM SULFATE IN D5W 1-5 GM/100ML-% IV SOLN
1.0000 g | Freq: Once | INTRAVENOUS | Status: AC
  Administered 2021-04-12: 1 g via INTRAVENOUS
  Filled 2021-04-12: qty 100

## 2021-04-12 MED ORDER — FUROSEMIDE 40 MG PO TABS
40.0000 mg | ORAL_TABLET | Freq: Once | ORAL | Status: AC
  Administered 2021-04-12: 40 mg via ORAL
  Filled 2021-04-12: qty 1

## 2021-04-12 NOTE — Plan of Care (Signed)
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
## 2021-04-13 LAB — COMPREHENSIVE METABOLIC PANEL
ALT: 11 U/L (ref 0–44)
AST: 18 U/L (ref 15–41)
Albumin: 2.4 g/dL — ABNORMAL LOW (ref 3.5–5.0)
Alkaline Phosphatase: 70 U/L (ref 38–126)
Anion gap: 6 (ref 5–15)
BUN: 16 mg/dL (ref 6–20)
CO2: 32 mmol/L (ref 22–32)
Calcium: 8 mg/dL — ABNORMAL LOW (ref 8.9–10.3)
Chloride: 100 mmol/L (ref 98–111)
Creatinine, Ser: 0.44 mg/dL (ref 0.44–1.00)
GFR, Estimated: >60 mL/min (ref >60)
Glucose, Bld: 102 mg/dL — ABNORMAL HIGH (ref 70–99)
Potassium: 4 mmol/L (ref 3.5–5.1)
Sodium: 138 mmol/L (ref 135–145)
Total Bilirubin: 0.4 mg/dL (ref 0.3–1.2)
Total Protein: 5.4 g/dL — ABNORMAL LOW (ref 6.5–8.1)

## 2021-04-13 LAB — PREPARE RBC (CROSSMATCH)

## 2021-04-13 LAB — MAGNESIUM: Magnesium: 2 mg/dL (ref 1.7–2.4)

## 2021-04-13 LAB — BRAIN NATRIURETIC PEPTIDE: B Natriuretic Peptide: 244.8 pg/mL — ABNORMAL HIGH (ref 0.0–100.0)

## 2021-04-13 LAB — CBC WITH DIFFERENTIAL/PLATELET
Abs Immature Granulocytes: 0.08 10*3/uL — ABNORMAL HIGH (ref 0.00–0.07)
Basophils Absolute: 0 10*3/uL (ref 0.0–0.1)
Basophils Relative: 0 %
Eosinophils Absolute: 0 10*3/uL (ref 0.0–0.5)
Eosinophils Relative: 0 %
HCT: 22.8 % — ABNORMAL LOW (ref 36.0–46.0)
Hemoglobin: 7 g/dL — ABNORMAL LOW (ref 12.0–15.0)
Immature Granulocytes: 1 %
Lymphocytes Relative: 31 %
Lymphs Abs: 2.8 10*3/uL (ref 0.7–4.0)
MCH: 30 pg (ref 26.0–34.0)
MCHC: 30.7 g/dL (ref 30.0–36.0)
MCV: 97.9 fL (ref 80.0–100.0)
Monocytes Absolute: 0.6 10*3/uL (ref 0.1–1.0)
Monocytes Relative: 7 %
Neutro Abs: 5.5 10*3/uL (ref 1.7–7.7)
Neutrophils Relative %: 61 %
Platelets: 307 10*3/uL (ref 150–400)
RBC: 2.33 MIL/uL — ABNORMAL LOW (ref 3.87–5.11)
RDW: 17.6 % — ABNORMAL HIGH (ref 11.5–15.5)
WBC: 9.1 10*3/uL (ref 4.0–10.5)
nRBC: 0 % (ref 0.0–0.2)

## 2021-04-13 LAB — GLUCOSE, CAPILLARY
Glucose-Capillary: 86 mg/dL (ref 70–99)
Glucose-Capillary: 156 mg/dL — ABNORMAL HIGH (ref 70–99)
Glucose-Capillary: 93 mg/dL (ref 70–99)
Glucose-Capillary: 109 mg/dL — ABNORMAL HIGH (ref 70–99)

## 2021-04-13 LAB — C-REACTIVE PROTEIN: CRP: 2.3 mg/dL — ABNORMAL HIGH (ref <1.0)

## 2021-04-13 LAB — PROCALCITONIN: Procalcitonin: 0.14 ng/mL

## 2021-04-13 MED ORDER — SODIUM CHLORIDE 0.9% IV SOLUTION: Freq: Once | INTRAVENOUS | Status: AC

## 2021-04-13 MED ORDER — FUROSEMIDE 10 MG/ML IJ SOLN
20.0000 mg | Freq: Once | INTRAMUSCULAR | Status: AC
Start: 2021-04-13 — End: 2021-04-13
  Administered 2021-04-13: 20 mg via INTRAVENOUS
  Filled 2021-04-13: qty 2

## 2021-04-13 MED ORDER — ALBUMIN HUMAN 25 % IV SOLN
25.0000 g | Freq: Once | INTRAVENOUS | Status: AC
  Administered 2021-04-13: 25 g via INTRAVENOUS
  Filled 2021-04-13: qty 100

## 2021-04-13 MED ORDER — ALBUMIN HUMAN 25 % IV SOLN: 25.0000 g | Freq: Four times a day (QID) | INTRAVENOUS | Status: DC
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
## 2021-04-13 NOTE — Plan of Care (Signed)
°  Problem: Health Behavior/Discharge Planning: Goal: Ability to manage health-related needs will improve Outcome: Progressing   Problem: Clinical Measurements: Goal: Ability to maintain clinical measurements within normal limits will improve Outcome: Progressing Goal: Will remain free from infection Outcome: Progressing Goal: Diagnostic test results will improve Outcome: Progressing Goal: Respiratory complications will improve Outcome: Progressing Goal: Cardiovascular complication will be avoided Outcome: Progressing   Problem: Activity: Goal: Risk for activity intolerance will decrease Outcome: Progressing   Problem: Nutrition: Goal: Adequate nutrition will be maintained Outcome: Progressing   Problem: Coping: Goal: Level of anxiety will decrease Outcome: Progressing   Problem: Elimination: Goal: Will not experience complications related to bowel motility Outcome: Progressing Goal: Will not experience complications related to urinary retention Outcome: Progressing   Problem: Pain Managment: Goal: General experience of comfort will improve Outcome: Progressing   Problem: Safety: Goal: Ability to remain free from injury will improve Outcome: Progressing   Problem: Skin Integrity: Goal: Risk for impaired skin integrity will decrease Outcome: Progressing   Problem: Fluid Volume: Goal: Hemodynamic stability will improve Outcome: Progressing   Problem: Clinical Measurements: Goal: Diagnostic test results will improve Outcome: Progressing Goal: Signs and symptoms of infection will decrease Outcome: Progressing   Problem: Respiratory: Goal: Ability to maintain adequate ventilation will improve Outcome: Progressing   

## 2021-04-14 DIAGNOSIS — R6521 Severe sepsis with septic shock: Secondary | ICD-10-CM | POA: Diagnosis not present

## 2021-04-14 DIAGNOSIS — A419 Sepsis, unspecified organism: Secondary | ICD-10-CM | POA: Diagnosis not present

## 2021-04-14 LAB — CBC WITH DIFFERENTIAL/PLATELET
Abs Immature Granulocytes: 0.06 10*3/uL (ref 0.00–0.07)
Basophils Absolute: 0 10*3/uL (ref 0.0–0.1)
Basophils Relative: 0 %
Eosinophils Absolute: 0 10*3/uL (ref 0.0–0.5)
Eosinophils Relative: 0 %
HCT: 25.5 % — ABNORMAL LOW (ref 36.0–46.0)
Hemoglobin: 8 g/dL — ABNORMAL LOW (ref 12.0–15.0)
Immature Granulocytes: 1 %
Lymphocytes Relative: 41 %
Lymphs Abs: 2.8 10*3/uL (ref 0.7–4.0)
MCH: 30.1 pg (ref 26.0–34.0)
MCHC: 31.4 g/dL (ref 30.0–36.0)
MCV: 95.9 fL (ref 80.0–100.0)
Monocytes Absolute: 0.5 10*3/uL (ref 0.1–1.0)
Monocytes Relative: 7 %
Neutro Abs: 3.5 10*3/uL (ref 1.7–7.7)
Neutrophils Relative %: 51 %
Platelets: 335 10*3/uL (ref 150–400)
RBC: 2.66 MIL/uL — ABNORMAL LOW (ref 3.87–5.11)
RDW: 18.6 % — ABNORMAL HIGH (ref 11.5–15.5)
WBC: 6.9 10*3/uL (ref 4.0–10.5)
nRBC: 0.4 % — ABNORMAL HIGH (ref 0.0–0.2)

## 2021-04-14 LAB — MAGNESIUM: Magnesium: 2 mg/dL (ref 1.7–2.4)

## 2021-04-14 LAB — TSH: TSH: 2.163 u[IU]/mL (ref 0.350–4.500)

## 2021-04-14 LAB — COMPREHENSIVE METABOLIC PANEL
ALT: 10 U/L (ref 0–44)
AST: 15 U/L (ref 15–41)
Albumin: 2.2 g/dL — ABNORMAL LOW (ref 3.5–5.0)
Alkaline Phosphatase: 67 U/L (ref 38–126)
Anion gap: 5 (ref 5–15)
BUN: 19 mg/dL (ref 6–20)
CO2: 32 mmol/L (ref 22–32)
Calcium: 8 mg/dL — ABNORMAL LOW (ref 8.9–10.3)
Chloride: 100 mmol/L (ref 98–111)
Creatinine, Ser: 0.4 mg/dL — ABNORMAL LOW (ref 0.44–1.00)
GFR, Estimated: >60 mL/min (ref >60)
Glucose, Bld: 92 mg/dL (ref 70–99)
Potassium: 3.8 mmol/L (ref 3.5–5.1)
Sodium: 137 mmol/L (ref 135–145)
Total Bilirubin: 0.6 mg/dL (ref 0.3–1.2)
Total Protein: 5.4 g/dL — ABNORMAL LOW (ref 6.5–8.1)

## 2021-04-14 LAB — GLUCOSE, CAPILLARY
Glucose-Capillary: 80 mg/dL (ref 70–99)
Glucose-Capillary: 109 mg/dL — ABNORMAL HIGH (ref 70–99)
Glucose-Capillary: 117 mg/dL — ABNORMAL HIGH (ref 70–99)
Glucose-Capillary: 171 mg/dL — ABNORMAL HIGH (ref 70–99)

## 2021-04-14 LAB — TYPE AND SCREEN
ABO/RH(D): O NEG
Antibody Screen: NEGATIVE
Unit division: 0

## 2021-04-14 LAB — BPAM RBC
Blood Product Expiration Date: 202301112359
ISSUE DATE / TIME: 202301081419
Unit Type and Rh: 9500

## 2021-04-14 LAB — BRAIN NATRIURETIC PEPTIDE: B Natriuretic Peptide: 96.9 pg/mL (ref 0.0–100.0)

## 2021-04-14 LAB — C-REACTIVE PROTEIN: CRP: 2.1 mg/dL — ABNORMAL HIGH (ref <1.0)

## 2021-04-14 LAB — PROCALCITONIN: Procalcitonin: 0.12 ng/mL

## 2021-04-14 MED ORDER — DOXYCYCLINE HYCLATE 100 MG PO TABS
100.0000 mg | ORAL_TABLET | Freq: Two times a day (BID) | ORAL | Status: DC
  Administered 2021-04-14 – 2021-04-15 (×3): 100 mg via ORAL
  Filled 2021-04-14 (×3): qty 1

## 2021-04-14 MED ORDER — LORAZEPAM 2 MG/ML IJ SOLN
0.5000 mg | Freq: Once | INTRAMUSCULAR | Status: AC
  Administered 2021-04-14: 0.5 mg via INTRAVENOUS
  Filled 2021-04-14: qty 1
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
## 2021-04-14 NOTE — Progress Notes (Signed)
PT Cancellation Note  Patient Details Name: Kim [name] MRN: [id number] DOB: Dec 14, 1975   Cancelled Treatment:    Reason Eval/Treat Not Completed: Other (comment) Pt politely declining due to fatigue. Will follow up tomorrow.  Wyona Almas, PT, DPT Acute Rehabilitation Services Pager [phone] Office [phone]    Deno Etienne 04/14/2021, 2:56 PM
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
## 2021-04-14 NOTE — TOC Progression Note (Addendum)
Transition of Care [facility]) - Progression Note    Patient Details  Name: Kim [name] MRN: [id number] Date of Birth: 1975/11/24  Transition of Care [facility]) CM/SW Contact  Cyndi Bender, RN Phone Number: 04/14/2021, 4:10 PM  Clinical Narrative:    Orders for home health PT/OT/RN and DME BSC with drop arm. Patient agreeable to use in house provider for [facility]. Spoke to [place] with [facility] and [facility] will be delivered to the room. Patient deferred to [facility] to find home health agency. Spoke to angela with [facility] and referral denied. Spoke to [place] with [facility] and referral denied. Called [facility] and referral denied. Called [facility] and referral denied Sent request to [facility] and referral denied Spoke to Amy with [facility] and referral accepted   Expected Discharge Plan: [place] Barriers to Discharge: Continued Medical Work up  Expected Discharge Plan and Services Expected Discharge Plan: [place] In-house Referral: NA Discharge Planning Services: CM Consult Post Acute Care Choice: Durable Medical Equipment, Home Health Living arrangements for the past 2 months: Single Family Home                 DME Arranged: Bedside commode DME Agency: [facility] Date DME Agency Contacted: 04/14/21 Time DME Agency Contacted: [phone] Representative spoke with at DME Agency: Freda Munro HH Arranged: PT, OT, RN [place] Agency: NA         Social Determinants of Health ([place]) Interventions    Readmission Risk Interventions No flowsheet data found.

## 2021-04-15 DIAGNOSIS — R6520 Severe sepsis without septic shock: Secondary | ICD-10-CM

## 2021-04-15 LAB — COMPREHENSIVE METABOLIC PANEL
ALT: 11 U/L (ref 0–44)
AST: 14 U/L — ABNORMAL LOW (ref 15–41)
Albumin: 2.1 g/dL — ABNORMAL LOW (ref 3.5–5.0)
Alkaline Phosphatase: 71 U/L (ref 38–126)
Anion gap: 4 — ABNORMAL LOW (ref 5–15)
BUN: 17 mg/dL (ref 6–20)
CO2: 29 mmol/L (ref 22–32)
Calcium: 7.9 mg/dL — ABNORMAL LOW (ref 8.9–10.3)
Chloride: 103 mmol/L (ref 98–111)
Creatinine, Ser: 0.38 mg/dL — ABNORMAL LOW (ref 0.44–1.00)
GFR, Estimated: >60 mL/min (ref >60)
Glucose, Bld: 97 mg/dL (ref 70–99)
Potassium: 3.4 mmol/L — ABNORMAL LOW (ref 3.5–5.1)
Sodium: 136 mmol/L (ref 135–145)
Total Bilirubin: 0.5 mg/dL (ref 0.3–1.2)
Total Protein: 5.4 g/dL — ABNORMAL LOW (ref 6.5–8.1)

## 2021-04-15 LAB — CBC WITH DIFFERENTIAL/PLATELET
Abs Immature Granulocytes: 0.05 10*3/uL (ref 0.00–0.07)
Basophils Absolute: 0 10*3/uL (ref 0.0–0.1)
Basophils Relative: 0 %
Eosinophils Absolute: 0 10*3/uL (ref 0.0–0.5)
Eosinophils Relative: 0 %
HCT: 26.5 % — ABNORMAL LOW (ref 36.0–46.0)
Hemoglobin: 8.3 g/dL — ABNORMAL LOW (ref 12.0–15.0)
Immature Granulocytes: 1 %
Lymphocytes Relative: 38 %
Lymphs Abs: 3 10*3/uL (ref 0.7–4.0)
MCH: 30.1 pg (ref 26.0–34.0)
MCHC: 31.3 g/dL (ref 30.0–36.0)
MCV: 96 fL (ref 80.0–100.0)
Monocytes Absolute: 0.8 10*3/uL (ref 0.1–1.0)
Monocytes Relative: 10 %
Neutro Abs: 4 10*3/uL (ref 1.7–7.7)
Neutrophils Relative %: 51 %
Platelets: 346 10*3/uL (ref 150–400)
RBC: 2.76 MIL/uL — ABNORMAL LOW (ref 3.87–5.11)
RDW: 18.6 % — ABNORMAL HIGH (ref 11.5–15.5)
WBC: 7.9 10*3/uL (ref 4.0–10.5)
nRBC: 0 % (ref 0.0–0.2)

## 2021-04-15 LAB — GLUCOSE, CAPILLARY
Glucose-Capillary: 130 mg/dL — ABNORMAL HIGH (ref 70–99)
Glucose-Capillary: 159 mg/dL — ABNORMAL HIGH (ref 70–99)
Glucose-Capillary: 95 mg/dL (ref 70–99)

## 2021-04-15 LAB — C-REACTIVE PROTEIN: CRP: 4.5 mg/dL — ABNORMAL HIGH (ref <1.0)

## 2021-04-15 LAB — PROCALCITONIN: Procalcitonin: 0.24 ng/mL

## 2021-04-15 LAB — MAGNESIUM: Magnesium: 1.9 mg/dL (ref 1.7–2.4)

## 2021-04-15 LAB — BRAIN NATRIURETIC PEPTIDE: B Natriuretic Peptide: 43.8 pg/mL (ref 0.0–100.0)

## 2021-04-15 MED ORDER — POTASSIUM CHLORIDE CRYS ER 20 MEQ PO TBCR
40.0000 meq | EXTENDED_RELEASE_TABLET | Freq: Once | ORAL | Status: AC
Start: 2021-04-15 — End: 2021-04-15
  Administered 2021-04-15: 40 meq via ORAL
  Filled 2021-04-15: qty 2

## 2021-04-15 MED ORDER — DEXTROSE 5 % IV SOLN
5.0000 mg | Freq: Every day | INTRAVENOUS | Status: DC
  Administered 2021-04-15 – 2021-04-16 (×2): 5 mg via INTRAVENOUS
  Filled 2021-04-15 (×3): qty 12.5

## 2021-04-15 MED ORDER — LOPERAMIDE HCL 2 MG PO CAPS
4.0000 mg | ORAL_CAPSULE | Freq: Once | ORAL | Status: AC
  Administered 2021-04-15: 4 mg via ORAL
  Filled 2021-04-15: qty 2

## 2021-04-15 MED ORDER — DICLOFENAC SODIUM 1 % EX GEL
2.0000 g | Freq: Four times a day (QID) | CUTANEOUS | Status: DC
  Administered 2021-04-15 – 2021-04-16 (×7): 2 g via TOPICAL
  Filled 2021-04-15 (×2): qty 100

## 2021-04-15 MED ORDER — SACCHAROMYCES BOULARDII 250 MG PO CAPS
250.0000 mg | ORAL_CAPSULE | Freq: Two times a day (BID) | ORAL | Status: DC
  Administered 2021-04-15 – 2021-04-16 (×4): 250 mg via ORAL
  Filled 2021-04-15 (×4): qty 1

## 2021-04-15 NOTE — Plan of Care (Signed)
°  Problem: Health Behavior/Discharge Planning: Goal: Ability to manage health-related needs will improve Outcome: Progressing   Problem: Clinical Measurements: Goal: Ability to maintain clinical measurements within normal limits will improve Outcome: Progressing Goal: Will remain free from infection Outcome: Progressing Goal: Diagnostic test results will improve Outcome: Progressing Goal: Respiratory complications will improve Outcome: Progressing Goal: Cardiovascular complication will be avoided Outcome: Progressing   Problem: Activity: Goal: Risk for activity intolerance will decrease Outcome: Progressing   Problem: Nutrition: Goal: Adequate nutrition will be maintained Outcome: Progressing   Problem: Coping: Goal: Level of anxiety will decrease Outcome: Progressing   Problem: Elimination: Goal: Will not experience complications related to bowel motility Outcome: Progressing Goal: Will not experience complications related to urinary retention Outcome: Progressing   Problem: Pain Managment: Goal: General experience of comfort will improve Outcome: Progressing   Problem: Safety: Goal: Ability to remain free from injury will improve Outcome: Progressing   Problem: Skin Integrity: Goal: Risk for impaired skin integrity will decrease Outcome: Progressing   Problem: Fluid Volume: Goal: Hemodynamic stability will improve Outcome: Progressing   Problem: Clinical Measurements: Goal: Diagnostic test results will improve Outcome: Progressing Goal: Signs and symptoms of infection will decrease Outcome: Progressing   Problem: Respiratory: Goal: Ability to maintain adequate ventilation will improve Outcome: Progressing   

## 2021-04-15 NOTE — Progress Notes (Signed)
PT Cancellation Note  Patient Details Name: Kim [name] MRN: [id number] DOB: 05-10-1975   Cancelled Treatment:    Reason Eval/Treat Not Completed: Medical issues which prohibited therapy. Pt just received lunch tray. Reports she was started on a new antibiotic and is having issues with diarrhea. Politely requesting PT return tomorrow morning. She is looking forward to time in the wheelchair.   Lorriane Shire 04/15/2021, 11:58 AM  Lorrin Goodell, PT  Office # [phone] Pager [phone]

## 2021-04-15 NOTE — Plan of Care (Signed)
#  Perineal abscess SP I&D of perineal abscess on 04/04/21 -OR Cx Proteus mirabilis, strep aglactactiae, clostridium vulgatus   Recommendations: -Continue Augmentin for 2 weeks from OR(EOT 04/17/21)   #Left calcaneal OM -No Cx data available -No plans for AKA, purulent drainage improved.  -D/C doxycyline as drainage has improved -Follow-up with ortho, please obtain bone Bx with Cx for targeted therapy. -Follow-up in ID clinic to treat with 6 weeks of antibiotics.
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
## 2021-04-15 NOTE — Progress Notes (Signed)
Patient ID: Kim [name], female   DOB: Aug 11, 1975, 46 y.o.   MRN: [id number] Patient is seen in follow-up for left heel decubitus ulcer with chronic osteomyelitis of the calcaneus.  Examination there is no surrounding cellulitis there is no purulent drainage, the wound bed has healthy granulation tissue covering the calcaneus.  With the significant improvement in the wound, and the resolution of the purulent drainage, and patient's resistance to proceed with an above-the-knee amputation, I feel that it would be safe to proceed with Dial soap cleansing daily, dry dressing changes daily, to the left heel ulcer, and I will follow-up in the office in 1 week for reevaluation.
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
## 2021-04-16 DIAGNOSIS — L8994 Pressure ulcer of unspecified site, stage 4: Secondary | ICD-10-CM

## 2021-04-16 DIAGNOSIS — E440 Moderate protein-calorie malnutrition: Secondary | ICD-10-CM | POA: Insufficient documentation

## 2021-04-16 LAB — CBC WITH DIFFERENTIAL/PLATELET
Abs Immature Granulocytes: 0 10*3/uL (ref 0.00–0.07)
Basophils Absolute: 0.1 10*3/uL (ref 0.0–0.1)
Basophils Relative: 1 %
Eosinophils Absolute: 0.1 10*3/uL (ref 0.0–0.5)
Eosinophils Relative: 1 %
HCT: 26.2 % — ABNORMAL LOW (ref 36.0–46.0)
Hemoglobin: 8.2 g/dL — ABNORMAL LOW (ref 12.0–15.0)
Lymphocytes Relative: 30 %
Lymphs Abs: 2.3 10*3/uL (ref 0.7–4.0)
MCH: 30.5 pg (ref 26.0–34.0)
MCHC: 31.3 g/dL (ref 30.0–36.0)
MCV: 97.4 fL (ref 80.0–100.0)
Monocytes Absolute: 0.3 10*3/uL (ref 0.1–1.0)
Monocytes Relative: 4 %
Neutro Abs: 4.9 10*3/uL (ref 1.7–7.7)
Neutrophils Relative %: 64 %
Platelets: 370 10*3/uL (ref 150–400)
RBC: 2.69 MIL/uL — ABNORMAL LOW (ref 3.87–5.11)
RDW: 18.2 % — ABNORMAL HIGH (ref 11.5–15.5)
WBC: 7.7 10*3/uL (ref 4.0–10.5)
nRBC: 0 % (ref 0.0–0.2)
nRBC: 0 /100 WBC

## 2021-04-16 LAB — PROCALCITONIN: Procalcitonin: 0.24 ng/mL

## 2021-04-16 LAB — COMPREHENSIVE METABOLIC PANEL
ALT: 10 U/L (ref 0–44)
AST: 15 U/L (ref 15–41)
Albumin: 2 g/dL — ABNORMAL LOW (ref 3.5–5.0)
Alkaline Phosphatase: 68 U/L (ref 38–126)
Anion gap: 7 (ref 5–15)
BUN: 14 mg/dL (ref 6–20)
CO2: 26 mmol/L (ref 22–32)
Calcium: 7.8 mg/dL — ABNORMAL LOW (ref 8.9–10.3)
Chloride: 101 mmol/L (ref 98–111)
Creatinine, Ser: 0.43 mg/dL — ABNORMAL LOW (ref 0.44–1.00)
GFR, Estimated: >60 mL/min (ref >60)
Glucose, Bld: 87 mg/dL (ref 70–99)
Potassium: 3.7 mmol/L (ref 3.5–5.1)
Sodium: 134 mmol/L — ABNORMAL LOW (ref 135–145)
Total Bilirubin: 0.5 mg/dL (ref 0.3–1.2)
Total Protein: 5.4 g/dL — ABNORMAL LOW (ref 6.5–8.1)

## 2021-04-16 LAB — BRAIN NATRIURETIC PEPTIDE: B Natriuretic Peptide: 21 pg/mL (ref 0.0–100.0)

## 2021-04-16 LAB — C-REACTIVE PROTEIN: CRP: 6 mg/dL — ABNORMAL HIGH (ref <1.0)

## 2021-04-16 LAB — MAGNESIUM: Magnesium: 2 mg/dL (ref 1.7–2.4)
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
## 2021-04-16 NOTE — Plan of Care (Signed)
°  Problem: Health Behavior/Discharge Planning: Goal: Ability to manage health-related needs will improve Outcome: Progressing   Problem: Clinical Measurements: Goal: Ability to maintain clinical measurements within normal limits will improve Outcome: Progressing Goal: Will remain free from infection Outcome: Progressing Goal: Diagnostic test results will improve Outcome: Progressing Goal: Respiratory complications will improve Outcome: Progressing Goal: Cardiovascular complication will be avoided Outcome: Progressing   Problem: Activity: Goal: Risk for activity intolerance will decrease Outcome: Progressing   Problem: Nutrition: Goal: Adequate nutrition will be maintained Outcome: Progressing   Problem: Coping: Goal: Level of anxiety will decrease Outcome: Progressing   Problem: Elimination: Goal: Will not experience complications related to bowel motility Outcome: Progressing Goal: Will not experience complications related to urinary retention Outcome: Progressing   Problem: Pain Managment: Goal: General experience of comfort will improve Outcome: Progressing   Problem: Safety: Goal: Ability to remain free from injury will improve Outcome: Progressing   Problem: Skin Integrity: Goal: Risk for impaired skin integrity will decrease Outcome: Progressing   Problem: Fluid Volume: Goal: Hemodynamic stability will improve Outcome: Progressing   Problem: Clinical Measurements: Goal: Diagnostic test results will improve Outcome: Progressing Goal: Signs and symptoms of infection will decrease Outcome: Progressing   Problem: Respiratory: Goal: Ability to maintain adequate ventilation will improve Outcome: Progressing   

## 2021-04-16 NOTE — TOC Progression Note (Addendum)
Transition of Care [facility]) - Progression Note    Patient Details  Name: Kim [name] MRN: [id number] Date of Birth: 12-11-1975  [facility] CM/SW Contact  Cyndi Bender, RN Phone Number: 04/16/2021, 9:38 AM  Clinical Narrative:    Referral order sent to seating/wheelchair clinic at our outpt neuro facility.Here w/c was bought of [employer] years ago, is too big for her and she doesn't have the right cushion. PT suspects she would benefit form a tilt n space w/c and a ROHO cushion    I called and added patient to the wait list for WC fitting. Patient is aware. Patient is calling to find out if CSW with medicaid can help with renting a WC until she can get fitted for one.  Spoke to patient about potential discharge tomorrow.She states that her aide can pick her up once discharged.   Expected Discharge Plan: [place] Barriers to Discharge: Continued Medical Work up  Expected Discharge Plan and Services Expected Discharge Plan: [place] In-house Referral: NA Discharge Planning Services: CM Consult Post Acute Care Choice: Durable Medical Equipment, Home Health Living arrangements for the past 2 months: Single Family Home                 DME Arranged: Bedside commode DME Agency: [employer] Date DME Agency Contacted: 04/14/21 Time DME Agency Contacted: [phone] Representative spoke with at DME Agency: Freda Munro HH Arranged: PT, OT, RN [facility] Agency: [place] Date [place]: 04/14/21 Time [place]: [phone] Representative spoke with at [place]: Amy   Social Determinants of Health ([place]) Interventions    Readmission Risk Interventions No flowsheet data found.
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
## 2021-04-16 NOTE — Telephone Encounter (Signed)
Will close encounter

## 2021-04-17 ENCOUNTER — Other Ambulatory Visit (HOSPITAL_COMMUNITY): Payer: Self-pay

## 2021-04-17 ENCOUNTER — Ambulatory Visit: Payer: Medicare Other | Admitting: Internal Medicine

## 2021-04-17 LAB — GLUCOSE, CAPILLARY
Glucose-Capillary: 92 mg/dL (ref 70–99)
Glucose-Capillary: 110 mg/dL — ABNORMAL HIGH (ref 70–99)
Glucose-Capillary: 111 mg/dL — ABNORMAL HIGH (ref 70–99)

## 2021-04-17 LAB — PROCALCITONIN: Procalcitonin: <0.1 ng/mL

## 2021-04-17 MED ORDER — FERROUS SULFATE 325 (65 FE) MG PO TABS
325.0000 mg | ORAL_TABLET | Freq: Two times a day (BID) | ORAL | 0 refills | Status: DC
  Filled 2021-04-17: qty 60, 30d supply, fill #0

## 2021-04-17 MED ORDER — FOLIC ACID 1 MG PO TABS
1.0000 mg | ORAL_TABLET | Freq: Every day | ORAL | 0 refills | Status: DC
  Filled 2021-04-17: qty 30, 30d supply, fill #0

## 2021-04-17 MED ORDER — MIDODRINE HCL 10 MG PO TABS
10.0000 mg | ORAL_TABLET | Freq: Three times a day (TID) | ORAL | 0 refills | Status: DC
  Filled 2021-04-17: qty 90, 30d supply, fill #0

## 2021-04-17 MED ORDER — COLLAGENASE 250 UNIT/GM EX OINT
TOPICAL_OINTMENT | Freq: Every day | CUTANEOUS | 0 refills | Status: AC
  Filled 2021-04-17: qty 30, 30d supply, fill #0

## 2021-04-17 NOTE — TOC Transition Note (Signed)
Transition of Care [facility]) - CM/SW Discharge Note   Patient Details  Name: Kim [name] MRN: [id number] Date of Birth: 1975-06-03  Transition of Care [facility]) CM/SW Contact:  Cyndi Bender, RN Phone Number: 04/17/2021, 12:27 PM   Clinical Narrative:    Patient stable for discharge. Amy with [facility] notified of discharge. [employer] is giving the patient a loaner wheelchair until she can get fitted for a new one. Patient has transportation home    Barriers to Discharge: Barriers Resolved   Patient Goals and CMS Choice Patient states their goals for this hospitalization and ongoing recovery are:: Wants to go home CMS Medicare.gov Compare Post Acute Care list provided to:: Patient Choice offered to / list presented to : Patient  Discharge Placement                 Home with [facility]      Discharge Plan and Services In-house Referral: NA Discharge Planning Services: CM Consult Post Acute Care Choice: Durable Medical Equipment, Home Health          DME Arranged: Bedside commode DME Agency: [employer] Date DME Agency Contacted: 04/14/21 Time DME Agency Contacted: [phone] Representative spoke with at DME Agency: Freda Munro HH Arranged: PT, OT, RN [facility] Agency: [place] Date [place]: 04/14/21 Time [place]: [phone] Representative spoke with at [place]: Amy  Social Determinants of Health ([place]) Interventions     Readmission Risk Interventions Readmission Risk Prevention Plan 04/17/2021  Post Dischage Appt Complete  Medication Screening Complete  Transportation Screening Complete  Some recent data might be hidden
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
## 2021-04-17 NOTE — Discharge Summary (Addendum)
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 regurgitant velocity is 2.67 m/s, and  with an assumed right atrial pressure of 3 mmHg, the estimated right ventricular systolic pressure is 32.4 mmHg. Left Atrium: Left atrial size was normal in size. Right Atrium: Right atrial size was normal in size. Pericardium: There is no evidence of pericardial effusion. Mitral Valve: The mitral valve is grossly normal. Trivial mitral valve regurgitation. No evidence of mitral valve stenosis. Tricuspid Valve: The tricuspid valve is grossly normal. Tricuspid valve regurgitation is trivial. No evidence of tricuspid stenosis. Aortic Valve:  The aortic valve is tricuspid. Aortic valve regurgitation is not visualized. No aortic stenosis is present. Aortic valve mean gradient measures 4.0 mmHg. Aortic valve peak gradient measures 7.2 mmHg. Aortic valve area, by VTI measures 2.52 cm. Pulmonic Valve: The pulmonic valve was grossly normal. Pulmonic valve regurgitation is not visualized. No evidence of pulmonic stenosis. Aorta: The aortic root and ascending aorta are structurally normal, with no evidence of dilitation. Venous: The right lower pulmonary vein is normal. The inferior vena cava is normal in size with greater than 50% respiratory variability, suggesting right atrial pressure of 3 mmHg. IAS/Shunts: The atrial septum is grossly normal.  LEFT VENTRICLE PLAX 2D LVIDd:         4.40 cm   Diastology LVIDs:         3.20 cm   LV e' medial:    11.90 cm/s LV PW:         0.90 cm   LV E/e' medial:  8.1 LV IVS:        0.80 cm   LV e' lateral:   17.10 cm/s LVOT diam:     2.00 cm   LV E/e' lateral: 5.6 LV SV:         67 LV SV Index:   37 LVOT Area:     3.14 cm  RIGHT VENTRICLE          IVC RV Basal diam:  2.90 cm  IVC diam: 1.70 cm LEFT ATRIUM             Index        RIGHT ATRIUM           Index LA diam:        4.10 cm 2.26 cm/m   RA Area:     16.00  cm LA Vol (A2C):   49.1 ml 27.10 ml/m  RA Volume:   47.80 ml  26.38 ml/m LA Vol (A4C):   37.5 ml 20.70 ml/m LA Biplane Vol: 44.1 ml 24.34 ml/m  AORTIC VALVE AV Area (Vmax):    2.41 cm AV Area (Vmean):   2.31 cm AV Area (VTI):     2.52 cm AV Vmax:           134.00 cm/s AV Vmean:          89.900 cm/s AV VTI:            0.267 m AV Peak Grad:      7.2 mmHg AV Mean Grad:      4.0 mmHg LVOT Vmax:         103.00 cm/s LVOT Vmean:        66.000 cm/s LVOT VTI:          0.214 m LVOT/AV VTI ratio: 0.80  AORTA Ao Root diam: 2.90 cm Ao Asc diam:  2.90 cm MITRAL VALVE               TRICUSPID VALVE MV Area (PHT): 4.17 cm    TR Peak grad:   28.5 mmHg MV Decel Time: 182 msec    TR Vmax:        267.00 cm/s MV E velocity:  96.00 cm/s MV A velocity: 89.50 cm/s  SHUNTS MV E/A ratio:  1.07        Systemic VTI:  0.21 m                            Systemic Diam: 2.00 cm Eleonore Chiquito MD Electronically signed by Eleonore Chiquito MD Signature Date/Time: 04/04/2021/4:14:18 PM    Final       Subjective:  Patient denies any complaints overnight, no chest pain, no shortness of breath, no fever or chills, she is excited about going home today. Discharge Exam: Vitals:   04/17/21 0408 04/17/21 0758  BP: (!) 84/53 96/64  Pulse: 100 (!) 103  Resp: 15 14  Temp: 98 F (36.7 C) (!) 97.5 F (36.4 C)  SpO2: 95% 98%   Vitals:   04/16/21 2003 04/16/21 2332 04/17/21 0408 04/17/21 0758  BP: (!) 102/57 103/66 (!) 84/53 96/64  Pulse: (!) 102 (!) 102 100 (!) 103  Resp: _0 Temp: 98.6 F (37 C) 98.3 F (36.8 C) 98 F (36.7 C) (!) 97.5 F (36.4 C)  TempSrc: Oral Oral Oral Oral  SpO2: 97% 99% 95% 98%  Weight:      Height:        General: Pt is alert, awake, not in acute distress Cardiovascular: RRR, S1/S2 +, no rubs, no gallops Respiratory: CTA bilaterally, no wheezing, no rhonchi Abdominal: Soft, NT, ND, bowel sounds + Extremities: Left foot bandaged, chronic paraplegia, Foley present at time of discharge, has mild edema.  Sent was seen and examined with OT therapist patient Adonis Brook at bedside.  The results of significant diagnostics from this hospitalization (including imaging, microbiology, ancillary and laboratory) are listed below for reference.     Microbiology: No results found for this or any previous visit (from the past 240 hour(s)).   Labs: BNP (last 3 results) Recent Labs    04/14/21 0205 04/15/21 0119 04/16/21 0134  BNP 96.9 43.8 91.5   Basic Metabolic Panel: Recent Labs  Lab 04/12/21 1008 04/13/21 0051 04/14/21 0205 04/15/21 0119 04/16/21 0134  NA 138 138 137 136 134*  K 3.7 4.0 3.8 3.4* 3.7  CL 103 100 100 103 101  CO2 30 32 32 29 26  GLUCOSE 87 102* 92 97 87  BUN _0 CREATININE 0.61 0.44 0.40* 0.38* 0.43*  CALCIUM 7.7* 8.0* 8.0* 7.9* 7.8*  MG 1.9 2.0 2.0 1.9 2.0   Liver Function Tests: Recent Labs  Lab 04/13/21 0051 04/14/21 0205 04/15/21 0119 04/16/21 0134  AST 18 15 14* 15  ALT _1 ALKPHOS 70 67 71 68  BILITOT 0.4 0.6 0.5 0.5  PROT 5.4* 5.4* 5.4* 5.4*  ALBUMIN 2.4* 2.2* 2.1* 2.0*   No results for input(s): LIPASE, AMYLASE in the last 168 hours. No results for input(s): AMMONIA in the last 168 hours. CBC: Recent Labs  Lab 04/13/21 0051 04/14/21 0205 04/15/21 0119 04/16/21 0134  WBC 9.1 6.9 7.9 7.7  NEUTROABS 5.5 3.5 4.0 4.9  HGB 7.0* 8.0* 8.3* 8.2*  HCT 22.8* 25.5* 26.5* 26.2*  MCV 97.9 95.9 96.0 97.4  PLT 307 335 346 370   Cardiac Enzymes: No results for input(s): CKTOTAL, CKMB, CKMBINDEX, TROPONINI in the last 168 hours. BNP: Invalid input(s): POCBNP CBG: Recent Labs  Lab 04/15/21 0758 04/15/21 1201 04/15/21 1708 04/17/21 0629 04/17/21 0757  GLUCAP 130* 159* 95 92 110*   D-Dimer No results for input(s): DDIMER in the last 72 hours. Hgb A1c No results for input(s): HGBA1C in the last 72 hours. Lipid Profile No results for input(s): CHOL, HDL, LDLCALC, TRIG, CHOLHDL, LDLDIRECT in the last 72 hours. Thyroid function studies Recent Labs    04/14/21 1150  TSH 2.163   Anemia work up No results for input(s): VITAMINB12, FOLATE, FERRITIN, TIBC, IRON, RETICCTPCT in the last 72 hours. Urinalysis    Component Value Date/Time   COLORURINE YELLOW 04/03/2021 1910   APPEARANCEUR CLOUDY (A) 04/03/2021 1910   LABSPEC 1.013 04/03/2021 1910   PHURINE 9.0 (H) 04/03/2021 1910   GLUCOSEU NEGATIVE 04/03/2021 1910   HGBUR SMALL (A) 04/03/2021 1910   BILIRUBINUR NEGATIVE 04/03/2021 1910   KETONESUR NEGATIVE 04/03/2021 1910   PROTEINUR 30 (A) 04/03/2021 1910   NITRITE POSITIVE (A) 04/03/2021 1910   LEUKOCYTESUR LARGE (A) 04/03/2021 1910   Sepsis Labs Invalid input(s): PROCALCITONIN,  WBC,   LACTICIDVEN Microbiology No results found for this or any previous visit (from the past 240 hour(s)).   Time coordinating discharge: Over 30 minutes  SIGNED:   Phillips Climes, MD  [facility] 04/17/2021, 10:06 AM Pager   If 7PM-7AM, please contact night-coverage [email or web address] Password [email or web address]

## 2021-04-17 NOTE — Progress Notes (Signed)
RN at bedside. Patient appears calm + free of pain. Patient states she prefers to go home immediately; patient refuses morning medications and wound dressing assessment. Patient educated on refusing high risk medications and refusing wound inspection. Patient pleasantly reiterated she prefers to go home; waiting on ride from domestic partner. Patient telemonitoring connections removed + IV removed. All questions related to discharged answered by RN. Agricultural consultant notified. Volunteer services notified.

## 2021-04-21 ENCOUNTER — Telehealth: Payer: Self-pay

## 2021-04-21 DIAGNOSIS — G8220 Paraplegia, unspecified: Secondary | ICD-10-CM | POA: Diagnosis not present

## 2021-04-21 DIAGNOSIS — R6521 Severe sepsis with septic shock: Secondary | ICD-10-CM | POA: Diagnosis not present

## 2021-04-21 DIAGNOSIS — D638 Anemia in other chronic diseases classified elsewhere: Secondary | ICD-10-CM | POA: Diagnosis not present

## 2021-04-21 DIAGNOSIS — L89224 Pressure ulcer of left hip, stage 4: Secondary | ICD-10-CM | POA: Diagnosis not present

## 2021-04-21 DIAGNOSIS — A419 Sepsis, unspecified organism: Secondary | ICD-10-CM | POA: Diagnosis not present

## 2021-04-21 DIAGNOSIS — L89624 Pressure ulcer of left heel, stage 4: Secondary | ICD-10-CM | POA: Diagnosis not present

## 2021-04-21 DIAGNOSIS — L89324 Pressure ulcer of left buttock, stage 4: Secondary | ICD-10-CM | POA: Diagnosis not present

## 2021-04-21 DIAGNOSIS — L89214 Pressure ulcer of right hip, stage 4: Secondary | ICD-10-CM | POA: Diagnosis not present

## 2021-04-21 DIAGNOSIS — E43 Unspecified severe protein-calorie malnutrition: Secondary | ICD-10-CM | POA: Diagnosis not present

## 2021-04-21 DIAGNOSIS — M86472 Chronic osteomyelitis with draining sinus, left ankle and foot: Secondary | ICD-10-CM | POA: Diagnosis not present

## 2021-04-21 NOTE — Telephone Encounter (Signed)
Transition Care Management Follow-up Telephone Call Date of discharge and from where: [facility] 04/17/21 - septic shock How have you been since you were released from the hospital? Much better Any questions or concerns? No  Items Reviewed: Did the pt receive and understand the discharge instructions provided? Yes  Medications obtained and verified? Yes  Other? No  Any new allergies since your discharge? No  Dietary orders reviewed? Yes Do you have support at home? Yes   Home Care and Equipment/Supplies: Were home health services ordered? yes If so, what is the name of the agency? [facility]  Has the agency set up a time to come to the patient's home? yes Were any new equipment or medical supplies ordered?  Yes: hospital bed, BSC with dropdown arm, wheelchair cushion, wedge cushion for bed What is the name of the medical supply agency? [facility] Were you able to get the supplies/equipment? Waiting for wedge and hosp bed Do you have any questions related to the use of the equipment or supplies? No  Functional Questionnaire: (I = Independent and D = Dependent) ADLs: D  Bathing/Dressing- D  Meal Prep- D  Eating- I  Maintaining continence- I  Transferring/Ambulation- D  Managing Meds- I  Follow up appointments reviewed:  PCP Hospital f/u appt confirmed? Yes  Scheduled to see Christy on 04/25/21 @ 11:40. [facility] f/u appt confirmed? Yes  Scheduled to see I.D on 04/22/21 @ 9 - getting ready to call Urology and Dr Sharol Given to make appts with them also. Are transportation arrangements needed? No  If their condition worsens, is the pt aware to call PCP or go to the Emergency Dept.? Yes Was the patient provided with contact information for the PCP's office or ED? Yes Was to pt encouraged to call back with questions or concerns? Yes

## 2021-04-22 ENCOUNTER — Encounter: Payer: Self-pay | Admitting: Internal Medicine

## 2021-04-22 ENCOUNTER — Ambulatory Visit (INDEPENDENT_AMBULATORY_CARE_PROVIDER_SITE_OTHER): Payer: Commercial Managed Care - HMO | Admitting: Internal Medicine

## 2021-04-22 ENCOUNTER — Other Ambulatory Visit: Payer: Self-pay

## 2021-04-22 ENCOUNTER — Ambulatory Visit: Payer: Medicaid Other | Admitting: Internal Medicine

## 2021-04-22 DIAGNOSIS — L89624 Pressure ulcer of left heel, stage 4: Secondary | ICD-10-CM

## 2021-04-22 DIAGNOSIS — Z720 Tobacco use: Secondary | ICD-10-CM | POA: Insufficient documentation

## 2021-04-22 DIAGNOSIS — E43 Unspecified severe protein-calorie malnutrition: Secondary | ICD-10-CM

## 2021-04-22 DIAGNOSIS — L89324 Pressure ulcer of left buttock, stage 4: Secondary | ICD-10-CM | POA: Diagnosis not present

## 2021-04-22 DIAGNOSIS — L89214 Pressure ulcer of right hip, stage 4: Secondary | ICD-10-CM | POA: Diagnosis not present

## 2021-04-22 DIAGNOSIS — S91309A Unspecified open wound, unspecified foot, initial encounter: Secondary | ICD-10-CM | POA: Diagnosis not present

## 2021-04-22 DIAGNOSIS — M86472 Chronic osteomyelitis with draining sinus, left ankle and foot: Secondary | ICD-10-CM

## 2021-04-22 DIAGNOSIS — A419 Sepsis, unspecified organism: Secondary | ICD-10-CM | POA: Diagnosis not present

## 2021-04-22 DIAGNOSIS — D638 Anemia in other chronic diseases classified elsewhere: Secondary | ICD-10-CM | POA: Diagnosis not present

## 2021-04-22 DIAGNOSIS — G8220 Paraplegia, unspecified: Secondary | ICD-10-CM | POA: Diagnosis not present

## 2021-04-22 DIAGNOSIS — L89224 Pressure ulcer of left hip, stage 4: Secondary | ICD-10-CM | POA: Diagnosis not present

## 2021-04-22 DIAGNOSIS — R6521 Severe sepsis with septic shock: Secondary | ICD-10-CM | POA: Diagnosis not present

## 2021-04-22 NOTE — Progress Notes (Signed)
° °  Subjective:    Patient ID: Kim [name], female    DOB: May 25, 1975, 46 y.o.   MRN: [id number]  HPI She is here for hsfu She was hospitalized in December with perineal abscess and underwent I and D on 04/04/21 and treated with Augmentin for 2 weeks. She also was found to have left calcaneal osteomyelitis based on clinical exam and CT findings and amputation was recommended by Dr. Sharol Given.  She was reluctant for amputation and opted out of amputation.  She is here for follow up after leaving the hospital.  She is getting wound care at home, heel ulcer is healing.  No pus noted.     Review of Systems  Constitutional:  Negative for chills, fatigue and fever.  Gastrointestinal:  Negative for diarrhea and nausea.  Skin:  Negative for rash.      Objective:   Physical Exam Eyes:     General: No scleral icterus. Pulmonary:     Effort: Pulmonary effort is normal.  Musculoskeletal:     Comments: Left heel with overlying tissue, does not probe to bone.  No surrounding erythema, no drainage/pus.    Skin:    Findings: No rash.  Neurological:     Mental Status: She is alert.  Psychiatric:        Mood and Affect: Mood normal.   SH: + tobacco       Assessment & Plan:

## 2021-04-22 NOTE — Assessment & Plan Note (Signed)
Calcaneal ostoemyelitis, chronic but healing now with wound care.  Did not want amputation and surgical intervention would be the treatment.  It is healing though at this time, though doubt it will completely heal.  She is going to follow up with Dr. Sharol Given for further discussion of surgical plans/needs and can follow up here as needed.  No overt signs of infection at this time.

## 2021-04-22 NOTE — Assessment & Plan Note (Signed)
Discussed protein goals for improved healing.

## 2021-04-22 NOTE — Assessment & Plan Note (Signed)
Cessation discussed 

## 2021-04-24 ENCOUNTER — Other Ambulatory Visit: Payer: Self-pay

## 2021-04-24 ENCOUNTER — Ambulatory Visit (INDEPENDENT_AMBULATORY_CARE_PROVIDER_SITE_OTHER): Payer: Medicare Other

## 2021-04-24 DIAGNOSIS — L89214 Pressure ulcer of right hip, stage 4: Secondary | ICD-10-CM | POA: Diagnosis not present

## 2021-04-24 DIAGNOSIS — A419 Sepsis, unspecified organism: Secondary | ICD-10-CM

## 2021-04-24 DIAGNOSIS — L89324 Pressure ulcer of left buttock, stage 4: Secondary | ICD-10-CM | POA: Diagnosis not present

## 2021-04-24 DIAGNOSIS — L89224 Pressure ulcer of left hip, stage 4: Secondary | ICD-10-CM

## 2021-04-24 DIAGNOSIS — F17210 Nicotine dependence, cigarettes, uncomplicated: Secondary | ICD-10-CM

## 2021-04-24 DIAGNOSIS — I959 Hypotension, unspecified: Secondary | ICD-10-CM | POA: Diagnosis not present

## 2021-04-24 DIAGNOSIS — F419 Anxiety disorder, unspecified: Secondary | ICD-10-CM

## 2021-04-24 DIAGNOSIS — D638 Anemia in other chronic diseases classified elsewhere: Secondary | ICD-10-CM

## 2021-04-24 DIAGNOSIS — G8220 Paraplegia, unspecified: Secondary | ICD-10-CM | POA: Diagnosis not present

## 2021-04-24 DIAGNOSIS — E43 Unspecified severe protein-calorie malnutrition: Secondary | ICD-10-CM | POA: Diagnosis not present

## 2021-04-24 DIAGNOSIS — F1290 Cannabis use, unspecified, uncomplicated: Secondary | ICD-10-CM

## 2021-04-24 DIAGNOSIS — L89624 Pressure ulcer of left heel, stage 4: Secondary | ICD-10-CM | POA: Diagnosis not present

## 2021-04-24 DIAGNOSIS — M86472 Chronic osteomyelitis with draining sinus, left ankle and foot: Secondary | ICD-10-CM

## 2021-04-24 DIAGNOSIS — R6521 Severe sepsis with septic shock: Secondary | ICD-10-CM

## 2021-04-24 DIAGNOSIS — Z03818 Encounter for observation for suspected exposure to other biological agents ruled out: Secondary | ICD-10-CM | POA: Diagnosis not present

## 2021-04-24 DIAGNOSIS — Z466 Encounter for fitting and adjustment of urinary device: Secondary | ICD-10-CM

## 2021-04-25 ENCOUNTER — Telehealth (INDEPENDENT_AMBULATORY_CARE_PROVIDER_SITE_OTHER): Payer: Medicare Other | Admitting: Family

## 2021-04-25 ENCOUNTER — Encounter: Payer: Self-pay | Admitting: Family

## 2021-04-25 DIAGNOSIS — E440 Moderate protein-calorie malnutrition: Secondary | ICD-10-CM | POA: Diagnosis not present

## 2021-04-25 DIAGNOSIS — Z720 Tobacco use: Secondary | ICD-10-CM | POA: Diagnosis not present

## 2021-04-25 DIAGNOSIS — M25551 Pain in right hip: Secondary | ICD-10-CM

## 2021-04-25 DIAGNOSIS — M25552 Pain in left hip: Secondary | ICD-10-CM | POA: Diagnosis not present

## 2021-04-25 DIAGNOSIS — L89624 Pressure ulcer of left heel, stage 4: Secondary | ICD-10-CM

## 2021-04-25 DIAGNOSIS — S91309A Unspecified open wound, unspecified foot, initial encounter: Secondary | ICD-10-CM

## 2021-04-25 DIAGNOSIS — L8944 Pressure ulcer of contiguous site of back, buttock and hip, stage 4: Secondary | ICD-10-CM

## 2021-04-25 DIAGNOSIS — Z09 Encounter for follow-up examination after completed treatment for conditions other than malignant neoplasm: Secondary | ICD-10-CM | POA: Diagnosis not present

## 2021-04-25 DIAGNOSIS — S343XXD Injury of cauda equina, subsequent encounter: Secondary | ICD-10-CM | POA: Diagnosis not present

## 2021-04-25 DIAGNOSIS — M4716 Other spondylosis with myelopathy, lumbar region: Secondary | ICD-10-CM | POA: Diagnosis not present

## 2021-04-25 DIAGNOSIS — E43 Unspecified severe protein-calorie malnutrition: Secondary | ICD-10-CM

## 2021-04-25 DIAGNOSIS — S343XXS Injury of cauda equina, sequela: Secondary | ICD-10-CM

## 2021-04-25 DIAGNOSIS — M1712 Unilateral primary osteoarthritis, left knee: Secondary | ICD-10-CM

## 2021-04-25 MED ORDER — PROMOD PO LIQD: 30.0000 mL | Freq: Two times a day (BID) | ORAL | 11 refills | Status: AC

## 2021-04-25 MED ORDER — BED WEDGE MISC: 1.0000 | Freq: Two times a day (BID) | 0 refills | Status: DC

## 2021-04-25 MED ORDER — SOCK AID MISC: 1.0000 | Freq: Every day | 0 refills | Status: AC

## 2021-04-25 MED ORDER — JUVEN NUTRIVIGOR PO PACK: 1.0000 | PACK | Freq: Three times a day (TID) | ORAL | 3 refills | Status: AC

## 2021-04-25 MED ORDER — ENSURE HIGH PROTEIN PO LIQD: 237.0000 mL | Freq: Three times a day (TID) | ORAL | 11 refills | Status: AC

## 2021-04-25 MED ORDER — HEEL/ANKLE PROTECTOR MISC: 1.0000 | Freq: Every day | 1 refills | Status: AC

## 2021-04-25 NOTE — Progress Notes (Signed)
Virtual Visit Consent   Kim [name], you are scheduled for a virtual visit with a [facility] provider today.     Just as with appointments in the office, your consent must be obtained to participate.  Your consent will be active for this visit and any virtual visit you may have with one of our providers in the next 365 days.     If you have a MyChart account, a copy of this consent can be sent to you electronically.  All virtual visits are billed to your insurance company just like a traditional visit in the office.    As this is a virtual visit, video technology does not allow for your provider to perform a traditional examination.  This may limit your provider's ability to fully assess your condition.  If your provider identifies any concerns that need to be evaluated in person or the need to arrange testing (such as labs, EKG, etc.), we will make arrangements to do so.     Although advances in technology are sophisticated, we cannot ensure that it will always work on either your end or our end.  If the connection with a video visit is poor, the visit may have to be switched to a telephone visit.  With either a video or telephone visit, we are not always able to ensure that we have a secure connection.     I need to obtain your verbal consent now.   Are you willing to proceed with your visit today?    Kim [name] has provided verbal consent on 04/25/2021 for a virtual visit (video or telephone).   [name], FNP   Date: 04/25/2021 12:00 PM   Virtual Visit via Video Note   I, [name], connected with  Kim [name]  ([id number], 05/05/1975) on 04/25/21 at 11:40 AM EST by a video-enabled telemedicine application and verified that I am speaking with the correct person using two identifiers.  Location: Patient: Virtual Visit Location Patient: Home Provider: Virtual Visit Location Provider: Home Office   I discussed the limitations of evaluation and management by  telemedicine and the availability of in person appointments. The patient expressed understanding and agreed to proceed.    History of Present Illness: Kim [name] is a 46 y.o. who identifies as a female who was assigned female at birth, and is being seen today for hospital follow up. Today's visit was for Transitional Care Management.  The patient was discharged from [facility] on 04/17/21 with a primary diagnosis of septic shock.   Contact with the patient and/or caregiver, by a clinical staff member, was made on 04/21/21 and was documented as a telephone encounter within the EMR.  Through chart review and discussion with the patient I have determined that management of their condition is of high complexity.   She went to the ED with multiple pressure injuries on left heel, left ischium, right ischium perineal wound, cortical destruction of the left heel c/w calcaneal osteomyelitis, pelvic osteomyelitis and perirectal abscess. She had to have a debridement on 12/30. She has hx of cauda equina spinal cord injury and is wheelchair bound.   She completed all her antibiotics in the hospital. She was discharged with home health weekly, PT and OT BID, and an aid that is with her 13 hours a day. She saw ID and was cleared. She has a follow up with Ortho on 05/02/21 and Urologists 04/28/21 to discuss suprapubic foley catheter insertion.   She was encouraged  to start Ensure TID with meals.   HPI: HPI  Problems:  Patient Active Problem List   Diagnosis Date Noted   Tobacco abuse 04/22/2021   Malnutrition of moderate degree 04/16/2021   Chronic osteomyelitis of left foot with draining sinus (HCC)    Severe protein-calorie malnutrition ([place]) 04/08/2021   Pressure injury of contiguous region involving right buttock and hip, stage 4 ([place]) 02/10/2021   Pressure injury of left heel, stage 4 ([place]) 02/10/2021   Non-healing open wound of heel, initial encounter 04/12/2020   Encounter for routine  gynecological examination with Papanicolaou smear of cervix 03/03/2016   Enlarged uterus 03/03/2016   Postmenopausal 03/03/2016   GAD (generalized anxiety disorder) 01/21/2016   Osteopenia    Vitamin D deficiency 05/01/2014   Heterotopic ossification of bone 08/02/2013   Hip pain, bilateral 08/02/2013   Osteoarthritis of left knee 08/02/2013   Cauda equina spinal cord injury ([place]) 06/16/2013   KNEE PAIN 05/23/2007   SPONDYLOSIS WITH MYELOPATHY LUMBAR REGION 05/23/2007    Allergies:  Allergies  Allergen Reactions   Keflex [Cephalexin] Diarrhea   Medications:  Current Outpatient Medications:    Elastic Bandages & Supports (HEEL/ANKLE PROTECTOR) MISC, 1 each by Does not apply route at bedtime., Disp: 2 each, Rfl: 1   Nutritional Supplements (JUVEN NUTRIVIGOR) PACK, Take 1 each by mouth 3 (three) times daily., Disp: 180 each, Rfl: 3   Ascorbic Acid (VITAMIN C) 500 MG CAPS, Take 500 mg by mouth daily., Disp: 90 capsule, Rfl: 11   Buprenorphine HCl-Naloxone HCl 8-2 MG FILM, Place 1 Film under the tongue 3 (three) times daily., Disp: , Rfl:    CALCIUM-VITAMIN D PO, Take 1 tablet by mouth daily., Disp: , Rfl:    clonazePAM (KLONOPIN) 0.5 MG tablet, Take 0.5 mg by mouth 2 (two) times daily., Disp: , Rfl:    collagenase (SANTYL) ointment, Apply topically daily., Disp: 30 g, Rfl: 0   ferrous sulfate 325 (65 FE) MG tablet, Take 1 tablet (325 mg total) by mouth 2 (two) times daily with a meal., Disp: 60 tablet, Rfl: 0   folic acid (FOLVITE) 1 MG tablet, Take 1 tablet (1 mg total) by mouth daily., Disp: 30 tablet, Rfl: 0   Foot Care Products (SOCK AID) MISC, 1 Device by Does not apply route daily., Disp: 1 each, Rfl: 0   gabapentin (NEURONTIN) 300 MG capsule, Take 600 mg by mouth 2 (two) times daily., Disp: , Rfl:    gabapentin (NEURONTIN) 300 MG capsule, Take by mouth. (Patient not taking: Reported on 04/03/2021), Disp: , Rfl:    midodrine (PROAMATINE) 10 MG tablet, Take 1 tablet (10 mg total) by  mouth 3 (three) times daily with meals., Disp: 90 tablet, Rfl: 0   Misc. Devices (BED WEDGE) MISC, 1 each by Does not apply route 2 (two) times daily., Disp: 2 each, Rfl: 0   Misc. Devices (TRANSFER BENCH) MISC, 1 Device by Does not apply route as needed., Disp: 1 each, Rfl: 1   Misc. Devices [facility] CUSHION) MISC, 1 application by Does not apply route daily., Disp: 1 each, Rfl: 1   Multiple Vitamins-Calcium (ONE-A-DAY WOMENS PO), Take 2 tablets by mouth daily., Disp: , Rfl:    NARCAN 4 MG/0.1ML LIQD nasal spray kit, , Disp: , Rfl:    Nutritional Supplements (ENSURE HIGH PROTEIN) LIQD, Take 237 mLs by mouth 4 (four) times daily - after meals and at bedtime., Disp: 3792 mL, Rfl: 11   Nutritional Supplements (PROMOD) LIQD, Take 30 mLs by  mouth 2 (two) times daily., Disp: 946 mL, Rfl: 11   Potassium 75 MG TABS, Take 1 tablet by mouth daily., Disp: , Rfl:    Zinc 50 MG CAPS, Take 1 capsule (50 mg total) by mouth daily., Disp: 90 capsule, Rfl: 1  Observations/Objective: Patient is well-developed, well-nourished in no acute distress.  Resting comfortably in her bed with bar at home.  Head is normocephalic, atraumatic.  No labored breathing.  Speech is clear and coherent with logical content.  Patient is alert and oriented at baseline.  Wounds have dressing intact, nurse just placed new dressing yesterday.   Assessment and Plan: 1. Cauda equina spinal cord injury, sequela - CMP14+EGFR; Future - CBC with Differential/Platelet; Future - Misc. Devices (BED WEDGE) MISC; 1 each by Does not apply route 2 (two) times daily.  Dispense: 2 each; Refill: 0 - Foot Care Products (SOCK AID) MISC; 1 Device by Does not apply route daily.  Dispense: 1 each; Refill: 0 - Elastic Bandages & Supports (HEEL/ANKLE PROTECTOR) MISC; 1 each by Does not apply route at bedtime.  Dispense: 2 each; Refill: 1  2. Malnutrition of moderate degree - CMP14+EGFR; Future - CBC with Differential/Platelet; Future - Elastic  Bandages & Supports (HEEL/ANKLE PROTECTOR) MISC; 1 each by Does not apply route at bedtime.  Dispense: 2 each; Refill: 1  3. Non-healing open wound of heel, initial encounter - Nutritional Supplements (PROMOD) LIQD; Take 30 mLs by mouth 2 (two) times daily.  Dispense: 946 mL; Refill: 11 - Nutritional Supplements (ENSURE HIGH PROTEIN) LIQD; Take 237 mLs by mouth 4 (four) times daily - after meals and at bedtime.  Dispense: 3792 mL; Refill: 11 - CMP14+EGFR; Future - CBC with Differential/Platelet; Future - Foot Care Products (SOCK AID) MISC; 1 Device by Does not apply route daily.  Dispense: 1 each; Refill: 0 - Elastic Bandages & Supports (HEEL/ANKLE PROTECTOR) MISC; 1 each by Does not apply route at bedtime.  Dispense: 2 each; Refill: 1  4. 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  Dispense: 946 mL; Refill: 11 - Nutritional Supplements (ENSURE HIGH PROTEIN) LIQD; Take 237 mLs by mouth 4 (four) times daily - after meals and at bedtime.  Dispense: 3792 mL; Refill: 11 - CMP14+EGFR; Future - CBC with Differential/Platelet; Future - Elastic Bandages & Supports (HEEL/ANKLE PROTECTOR) MISC; 1 each by Does not apply route at bedtime.  Dispense: 2 each; Refill: 1  5. Pressure injury of left heel, stage 4 (HCC) - Nutritional Supplements (PROMOD) LIQD; Take 30 mLs by mouth 2 (two) times daily.  Dispense: 946 mL; Refill: 11 - Nutritional Supplements (ENSURE HIGH PROTEIN) LIQD; Take 237 mLs by mouth 4 (four) times daily - after meals and at bedtime.  Dispense: 3792 mL; Refill: 11 - CMP14+EGFR; Future - CBC with Differential/Platelet; Future - Elastic Bandages & Supports (HEEL/ANKLE PROTECTOR) MISC; 1 each by Does not apply route at bedtime.  Dispense: 2 each; Refill: 1  6. Severe protein-calorie malnutrition ([place]) - CMP14+EGFR; Future - CBC with Differential/Platelet; Future -  Elastic Bandages & Supports (HEEL/ANKLE PROTECTOR) MISC; 1 each by Does not apply route at bedtime.  Dispense: 2 each; Refill: 1  7. Tobacco abuse - CMP14+EGFR; Future - CBC with Differential/Platelet; Future - Elastic Bandages & Supports (HEEL/ANKLE PROTECTOR) MISC; 1 each by Does not apply route at bedtime.  Dispense: 2 each; Refill: 1  8. Hospital discharge follow-up - CMP14+EGFR;  Future - CBC with Differential/Platelet; Future  9. Hip pain, bilateral - Misc. Devices (BED WEDGE) MISC; 1 each by Does not apply route 2 (two) times daily.  Dispense: 2 each; Refill: 0  10. Osteoarthritis of left knee, unspecified osteoarthritis type - Misc. Devices (BED WEDGE) MISC; 1 each by Does not apply route 2 (two) times daily.  Dispense: 2 each; Refill: 0  11. SPONDYLOSIS WITH MYELOPATHY LUMBAR REGION - Misc. Devices (BED WEDGE) MISC; 1 each by Does not apply route 2 (two) times daily.  Dispense: 2 each; Refill: 0  Continue with wound care, home health, PT & OT Increase protein  Keep follow up with Ortho Pt will come next week to get labs drawn  Follow Up Instructions: I discussed the assessment and treatment plan with the patient. The patient was provided an opportunity to ask questions and all were answered. The patient agreed with the plan and demonstrated an understanding of the instructions.  A copy of instructions were sent to the patient via MyChart unless otherwise noted below.    The patient was advised to call back or seek an in-person evaluation if the symptoms worsen or if the condition fails to improve as anticipated.  Time:  I spent 26 minutes with the patient via telehealth technology discussing the above problems/concerns.    [name], FNP

## 2021-04-25 NOTE — Patient Instructions (Signed)
Protein-Energy Malnutrition Protein-energy malnutrition is when a person does not eat enough protein, fat, and calories. When this happens over time, it can lead to severe loss of muscle tissue (muscle wasting). This condition also affects the body's defense system (immune system) and can lead to other health problems. What are the causes? This condition may be caused by: Not eating enough protein, fat, or calories. Having certain chronic medical conditions. Eating too little. What increases the risk? The following factors may make you more likely to develop this condition: Living in poverty. Long-term hospitalization. Alcohol or drug dependency. Addiction often leads to a lifestyle in which proper diet is ignored. Dependency can also hurt the metabolism and the body's ability to absorb nutrients. Eating disorders, such as anorexia nervosa or bulimia. Chewing or swallowing problems. People with these disorders may not eat enough. Having certain conditions, such as: Inflammatory bowel disease. Inflammation of the intestines makes it difficult for the body to absorb nutrients. Cancer or AIDS. These diseases can cause a loss of appetite. Chronic heart failure. This interferes with how the body uses nutrients. Cystic fibrosis. This disease can make it difficult for the body to absorb nutrients. Eating a diet that extremely restricts protein, fat, or calorie intake. What are the signs or symptoms? Symptoms of this condition include: Tiredness (fatigue). Weakness. Dizziness. Fainting. Weight loss. Loss of muscle tone and muscle mass. Poor immune response. Lack of menstruation. Poor memory. Hair loss. Skin changes. How is this diagnosed? This condition may be diagnosed based on: Your medical and dietary history. A physical exam. This may include a measurement of your body mass index. Blood tests. How is this treated? This condition may be managed with: Nutrition therapy. This may  include working with a dietitian. Treatment for underlying conditions. People with severe protein-energy malnutrition may need to be treated in a hospital. This may involve receiving nutrition and fluids through an IV. Follow these instructions at home:  Eat a balanced diet. In each meal, include at least one food that is high in protein. Foods that are high in protein include: Meat. Poultry. Fish. Eggs. Cheese. Milk. Beans. Nuts. Eat nutrient-rich foods that are easy to swallow and digest, such as: Fruit and yogurt smoothies. Oatmeal with nut butter. Nutrition supplement drinks. Try to eat six small meals each day instead of three large meals. Take vitamin and protein supplements as told by your health care provider or dietitian. Follow your health care provider's recommendations about exercise and activity. Keep all follow-up visits. This is important. Contact a health care provider if: You have increased weakness or fatigue. You faint. You are a woman and you stop having your period (menstruating). You have rapid hair loss. You have unexpected weight loss. You have diarrhea. You have nausea and vomiting. Get help right away if: You have difficulty breathing. You have chest pain. These symptoms may represent a serious problem that is an emergency. Do not wait to see if the symptoms will go away. Get medical help right away. Call your local emergency services (911 in the [place] Do not drive yourself to the hospital. Summary Protein-energy malnutrition is when a person does not eat enough protein, fat, and calories. Protein-energy malnutrition can lead to severe loss of muscle tissue (muscle wasting). This condition also affects the body's defense system (immune system) and can lead to other health problems. Talk with your health care provider about treatment for this condition. Effective treatment depends on the underlying cause of the malnutrition. This information is not    intended to replace advice given to you by your health care provider. Make sure you discuss any questions you have with your health care provider. Document Revised: 03/23/2020 Document Reviewed: 03/23/2020 Elsevier Patient Education  2022 Elsevier Inc.  

## 2021-04-28 ENCOUNTER — Inpatient Hospital Stay: Payer: Commercial Managed Care - HMO | Admitting: Internal Medicine

## 2021-04-28 DIAGNOSIS — L89624 Pressure ulcer of left heel, stage 4: Secondary | ICD-10-CM | POA: Diagnosis not present

## 2021-04-28 DIAGNOSIS — L89224 Pressure ulcer of left hip, stage 4: Secondary | ICD-10-CM | POA: Diagnosis not present

## 2021-04-29 ENCOUNTER — Telehealth: Payer: Self-pay | Admitting: Family

## 2021-04-29 DIAGNOSIS — R339 Retention of urine, unspecified: Secondary | ICD-10-CM | POA: Diagnosis not present

## 2021-04-29 DIAGNOSIS — N3000 Acute cystitis without hematuria: Secondary | ICD-10-CM | POA: Diagnosis not present

## 2021-04-29 DIAGNOSIS — N319 Neuromuscular dysfunction of bladder, unspecified: Secondary | ICD-10-CM | POA: Diagnosis not present

## 2021-04-29 NOTE — Telephone Encounter (Signed)
Called and faxed med list

## 2021-04-29 NOTE — Telephone Encounter (Signed)
[name] called from [facility] requesting to speak with patients nurse regarding patient and her health and her medications.  Please call [name] at [phone]

## 2021-04-30 DIAGNOSIS — M86472 Chronic osteomyelitis with draining sinus, left ankle and foot: Secondary | ICD-10-CM | POA: Diagnosis not present

## 2021-04-30 DIAGNOSIS — L89324 Pressure ulcer of left buttock, stage 4: Secondary | ICD-10-CM | POA: Diagnosis not present

## 2021-04-30 DIAGNOSIS — D638 Anemia in other chronic diseases classified elsewhere: Secondary | ICD-10-CM | POA: Diagnosis not present

## 2021-04-30 DIAGNOSIS — L89224 Pressure ulcer of left hip, stage 4: Secondary | ICD-10-CM | POA: Diagnosis not present

## 2021-04-30 DIAGNOSIS — L89624 Pressure ulcer of left heel, stage 4: Secondary | ICD-10-CM | POA: Diagnosis not present

## 2021-04-30 DIAGNOSIS — G8220 Paraplegia, unspecified: Secondary | ICD-10-CM | POA: Diagnosis not present

## 2021-04-30 DIAGNOSIS — E43 Unspecified severe protein-calorie malnutrition: Secondary | ICD-10-CM | POA: Diagnosis not present

## 2021-04-30 DIAGNOSIS — A419 Sepsis, unspecified organism: Secondary | ICD-10-CM | POA: Diagnosis not present

## 2021-04-30 DIAGNOSIS — R6521 Severe sepsis with septic shock: Secondary | ICD-10-CM | POA: Diagnosis not present

## 2021-04-30 DIAGNOSIS — L89214 Pressure ulcer of right hip, stage 4: Secondary | ICD-10-CM | POA: Diagnosis not present

## 2021-05-01 DIAGNOSIS — M86472 Chronic osteomyelitis with draining sinus, left ankle and foot: Secondary | ICD-10-CM | POA: Diagnosis not present

## 2021-05-01 DIAGNOSIS — L89624 Pressure ulcer of left heel, stage 4: Secondary | ICD-10-CM | POA: Diagnosis not present

## 2021-05-01 DIAGNOSIS — A419 Sepsis, unspecified organism: Secondary | ICD-10-CM | POA: Diagnosis not present

## 2021-05-01 DIAGNOSIS — R6521 Severe sepsis with septic shock: Secondary | ICD-10-CM | POA: Diagnosis not present

## 2021-05-01 DIAGNOSIS — E43 Unspecified severe protein-calorie malnutrition: Secondary | ICD-10-CM | POA: Diagnosis not present

## 2021-05-01 DIAGNOSIS — L89324 Pressure ulcer of left buttock, stage 4: Secondary | ICD-10-CM | POA: Diagnosis not present

## 2021-05-01 DIAGNOSIS — G8220 Paraplegia, unspecified: Secondary | ICD-10-CM | POA: Diagnosis not present

## 2021-05-01 DIAGNOSIS — D638 Anemia in other chronic diseases classified elsewhere: Secondary | ICD-10-CM | POA: Diagnosis not present

## 2021-05-01 DIAGNOSIS — L89224 Pressure ulcer of left hip, stage 4: Secondary | ICD-10-CM | POA: Diagnosis not present

## 2021-05-01 DIAGNOSIS — L89214 Pressure ulcer of right hip, stage 4: Secondary | ICD-10-CM | POA: Diagnosis not present

## 2021-05-02 ENCOUNTER — Ambulatory Visit: Payer: Commercial Managed Care - HMO | Admitting: Family

## 2021-05-02 DIAGNOSIS — D638 Anemia in other chronic diseases classified elsewhere: Secondary | ICD-10-CM | POA: Diagnosis not present

## 2021-05-02 DIAGNOSIS — L89624 Pressure ulcer of left heel, stage 4: Secondary | ICD-10-CM | POA: Diagnosis not present

## 2021-05-02 DIAGNOSIS — G8220 Paraplegia, unspecified: Secondary | ICD-10-CM | POA: Diagnosis not present

## 2021-05-02 DIAGNOSIS — A419 Sepsis, unspecified organism: Secondary | ICD-10-CM | POA: Diagnosis not present

## 2021-05-02 DIAGNOSIS — E43 Unspecified severe protein-calorie malnutrition: Secondary | ICD-10-CM | POA: Diagnosis not present

## 2021-05-02 DIAGNOSIS — L89224 Pressure ulcer of left hip, stage 4: Secondary | ICD-10-CM | POA: Diagnosis not present

## 2021-05-02 DIAGNOSIS — L89324 Pressure ulcer of left buttock, stage 4: Secondary | ICD-10-CM | POA: Diagnosis not present

## 2021-05-02 DIAGNOSIS — M86472 Chronic osteomyelitis with draining sinus, left ankle and foot: Secondary | ICD-10-CM | POA: Diagnosis not present

## 2021-05-02 DIAGNOSIS — L89214 Pressure ulcer of right hip, stage 4: Secondary | ICD-10-CM | POA: Diagnosis not present

## 2021-05-02 DIAGNOSIS — R6521 Severe sepsis with septic shock: Secondary | ICD-10-CM | POA: Diagnosis not present

## 2021-05-05 ENCOUNTER — Encounter (HOSPITAL_COMMUNITY): Payer: Self-pay | Admitting: Radiology

## 2021-05-05 ENCOUNTER — Ambulatory Visit (INDEPENDENT_AMBULATORY_CARE_PROVIDER_SITE_OTHER): Payer: Medicare Other

## 2021-05-05 ENCOUNTER — Ambulatory Visit (INDEPENDENT_AMBULATORY_CARE_PROVIDER_SITE_OTHER): Payer: Medicare Other | Admitting: Family

## 2021-05-05 ENCOUNTER — Encounter: Payer: Self-pay | Admitting: Family

## 2021-05-05 VITALS — BP 111/75 | HR 113 | Temp 97.5°F | Wt 142.0 lb

## 2021-05-05 DIAGNOSIS — E43 Unspecified severe protein-calorie malnutrition: Secondary | ICD-10-CM

## 2021-05-05 DIAGNOSIS — M1711 Unilateral primary osteoarthritis, right knee: Secondary | ICD-10-CM | POA: Diagnosis not present

## 2021-05-05 DIAGNOSIS — E440 Moderate protein-calorie malnutrition: Secondary | ICD-10-CM

## 2021-05-05 DIAGNOSIS — M79604 Pain in right leg: Secondary | ICD-10-CM

## 2021-05-05 DIAGNOSIS — L8944 Pressure ulcer of contiguous site of back, buttock and hip, stage 4: Secondary | ICD-10-CM | POA: Diagnosis not present

## 2021-05-05 DIAGNOSIS — Z23 Encounter for immunization: Secondary | ICD-10-CM

## 2021-05-05 DIAGNOSIS — S343XXS Injury of cauda equina, sequela: Secondary | ICD-10-CM

## 2021-05-05 DIAGNOSIS — F411 Generalized anxiety disorder: Secondary | ICD-10-CM

## 2021-05-05 DIAGNOSIS — Z1159 Encounter for screening for other viral diseases: Secondary | ICD-10-CM | POA: Diagnosis not present

## 2021-05-05 DIAGNOSIS — Z720 Tobacco use: Secondary | ICD-10-CM | POA: Diagnosis not present

## 2021-05-05 DIAGNOSIS — L89624 Pressure ulcer of left heel, stage 4: Secondary | ICD-10-CM | POA: Diagnosis not present

## 2021-05-05 LAB — FUNGUS CULTURE RESULT

## 2021-05-05 LAB — FUNGUS CULTURE WITH STAIN

## 2021-05-05 LAB — FUNGAL ORGANISM REFLEX

## 2021-05-05 NOTE — Patient Instructions (Signed)
Pressure Injury °A pressure injury is damage to the skin and underlying tissue that results from pressure being applied to an area of the body. It often affects people who must spend a long time in a bed or chair because of a medical condition. °Pressure injuries usually occur: °Over bony parts of the body, such as the tailbone, shoulders, elbows, hips, heels, spine, ankles, and back of the head. °Under medical devices that make contact with the body, such as respiratory equipment, stockings, tubes, and splints. °Pressure injuries start as reddened areas on the skin and can lead to pain and an open wound. °What are the causes? °This condition is caused by frequent or constant pressure to an area of the body. Decreased blood flow to the skin can eventually cause the skin tissue to die and break down, causing a wound. °What increases the risk? °You are more likely to develop this condition if you: °Are in the hospital or an extended care facility. °Are bedridden or in a wheelchair. °Have an injury or disease that keeps you from: °Moving normally. °Feeling pain or pressure. °Have a condition that: °Makes you sleepy or less alert. °Causes poor blood flow. °Need to wear a medical device. °Have poor control of your bladder or bowel functions (incontinence). °Have poor nutrition (malnutrition). °If you are at risk for pressure injuries, your health care provider may recommend certain types of mattresses, mattress covers, pillows, cushions, or boots to help prevent them. These may include products filled with air, foam, gel, or sand. °What are the signs or symptoms? °Symptoms of this condition depend on the severity of the injury. Symptoms may include: °Red or dark areas of the skin. °Pain, warmth, or a change of skin texture. °Blisters. °An open wound. °How is this diagnosed? °This condition is diagnosed with a medical history and physical exam. You may also have tests, such as: °Blood tests. °Imaging tests. °Blood flow  tests. °Your pressure injury will be staged based on its severity. Staging is based on: °The depth of the tissue injury, including whether there is exposure of muscle, bone, or tendon. °The cause of the pressure injury. °How is this treated? °This condition may be treated by: °Relieving or redistributing pressure on your skin. This includes: °Frequently changing your position. °Avoiding positions that caused the wound or that can make the wound worse. °Using specific bed mattresses, chair cushions, or protective boots. °Moving medical devices from an area of pressure, or placing padding between the skin and the device. °Using foams, creams, or powders to prevent rubbing (friction) on the skin. °Keeping your skin clean and dry. This may include using a skin cleanser or skin barrier as told by your health care provider. °Cleaning your injury and removing any dead tissue from the wound (debridement). °Placing a bandage (dressing) over your injury. °Using medicines for pain or to prevent or treat infection. °Surgery may be needed if other treatments are not working or if your injury is very deep. °Follow these instructions at home: °Wound care °Follow instructions from your health care provider about how to take care of your wound. Make sure you: °Wash your hands with soap and water before and after you change your bandage (dressing). If soap and water are not available, use hand sanitizer. °Change your dressing as told by your health care provider.  °Check your wound every day for signs of infection. Have a caregiver do this for you if you are not able. Check for: °Redness, swelling, or increased pain. °More fluid   or blood. °Warmth. °Pus or a bad smell. °Skin care °Keep your skin clean and dry. Gently pat your skin dry. °Do not rub or massage your skin. °You or a caregiver should check your skin every day for any changes in color or any new blisters or sores (ulcers). °Medicines °Take over-the-counter and prescription  medicines only as told by your health care provider. °If you were prescribed an antibiotic medicine, take or apply it as told by your health care provider. Do not stop using the antibiotic even if your condition improves. °Reducing and redistributing pressure °Do not lie or sit in one position for a long time. Move or change position every 1-2 hours, or as told by your health care provider. °Use pillows or cushions to reduce pressure. Ask your health care provider to recommend cushions or pads for you. °General instructions ° °Eat a healthy diet that includes lots of protein. °Drink enough fluid to keep your urine pale yellow. °Be as active as you can every day. Ask your health care provider to suggest safe exercises or activities. °Do not abuse drugs or alcohol. °Do not use any products that contain nicotine or tobacco, such as cigarettes, e-cigarettes, and chewing tobacco. If you need help quitting, ask your health care provider. °Keep all follow-up visits as told by your health care provider. This is important. °Contact a health care provider if: °You have: °A fever or chills. °Pain that is not helped by medicine. °Any changes in skin color. °New blisters or sores. °Pus or a bad smell coming from your wound. °Redness, swelling, or pain around your wound. °More fluid or blood coming from your wound. °Your wound does not improve after 1-2 weeks of treatment. °Summary °A pressure injury is damage to the skin and underlying tissue that results from pressure being applied to an area of the body. °Do not lie or sit in one position for a long time. Your health care provider may advise you to move or change position every 1-2 hours. °Follow instructions from your health care provider about how to take care of your wound. °Keep all follow-up visits as told by your health care provider. This is important. °This information is not intended to replace advice given to you by your health care provider. Make sure you discuss  any questions you have with your health care provider. °Document Revised: 10/20/2017 Document Reviewed: 10/20/2017 °[employer] Patient Education © 2022 [employer] ° °

## 2021-05-05 NOTE — Progress Notes (Signed)
Subjective:    Patient ID: Kim [name], female    DOB: 1976-01-22, 46 y.o.   MRN: [id number]  Chief Complaint  Patient presents with   Leg Pain    Right leg    Pt presents to the office today for chronic follow up. She has a pressure ulcer left heel, two on left buttocks, and right buttocks. She is currently going Wound care and has her first appointment Thursday to establish with them. She currently has Home health once a week, PT BID, OT BID, and an aid 7AM- 1 pm & 4-9 pm.  She reports her pain is a burning pain of 5 out 10. ID has cleared her and currently not on any antibiotics. Home health said no necrotic tissue or odor on wounds.   She has an Orthopedic appointment Friday.    She has not been able to get her Ensure, she is waiting for CAPS. She has been trying to increase protein in her meals.   She has cauda equina spinal cord injury and is wheelchair bound.   She is also complaining of right lower get pain and protrusion. She noticed it last week. No pain except when touched approx 6-7 out 10.  Anxiety Presents for follow-up visit. Symptoms include depressed mood, excessive worry, irritability, nervous/anxious behavior and restlessness. Symptoms occur most days. The severity of symptoms is moderate. The quality of sleep is good.       Review of Systems  Constitutional:  Positive for irritability.  Psychiatric/Behavioral:  The patient is nervous/anxious.   All other systems reviewed and are negative.     Objective:   Physical Exam Vitals reviewed.  Constitutional:      General: She is not in acute distress.    Appearance: She is well-developed.  HENT:     Head: Normocephalic and atraumatic.  Eyes:     Pupils: Pupils are equal, round, and reactive to light.  Neck:     Thyroid: No thyromegaly.  Cardiovascular:     Rate and Rhythm: Normal rate and regular rhythm.     Heart sounds: Normal heart sounds. No murmur heard. Pulmonary:     Effort: Pulmonary effort  is normal. No respiratory distress.     Breath sounds: Normal breath sounds. No wheezing.  Abdominal:     General: Bowel sounds are normal. There is no distension.     Palpations: Abdomen is soft.     Tenderness: There is no abdominal tenderness.  Musculoskeletal:        General: No tenderness. Normal range of motion.     Cervical back: Normal range of motion and neck supple.  Skin:    General: Skin is warm and dry.     Findings: Erythema present.     Comments: Dressing intact  Neurological:     Mental Status: She is alert and oriented to person, place, and time.     Cranial Nerves: No cranial nerve deficit.     Motor: Weakness present.     Gait: Gait abnormal.     Deep Tendon Reflexes: Reflexes are normal and symmetric.     Comments: Wheelchair bound  Psychiatric:        Behavior: Behavior normal.        Thought Content: Thought content normal.        Judgment: Judgment normal.     BP 111/75    Pulse (!) 113    Temp (!) 97.5 F (36.4 C) (Temporal)    Wt 142  lb (64.4 kg) Comment: wc   LMP 11/24/2012    BMI 22.92 kg/m      Assessment & Plan:  [name] comes in today with chief complaint of Leg Pain (Right leg )   Diagnosis and orders addressed:  1. Need for immunization against influenza - Flu Vaccine QUAD 28moIM (Fluarix, Fluzone & Alfiuria Quad PF) - CMP14+EGFR - CBC with Differential/Platelet  2. Cauda equina spinal cord injury, sequela  - CMP14+EGFR - CBC with Differential/Platelet - DG Tibia/Fibula Right; Future  3. GAD (generalized anxiety disorder)  - CMP14+EGFR - CBC with Differential/Platelet  4. Malnutrition of moderate degree - CMP14+EGFR - CBC with Differential/Platelet  5. Pressure injury of contiguous region involving right buttock and hip, stage 4 (HCC) - CMP14+EGFR - CBC with Differential/Platelet  6. Pressure injury of left heel, stage 4 (HCC) - CMP14+EGFR - CBC with Differential/Platelet  7. Severe protein-calorie  malnutrition ([place] - CMP14+EGFR - CBC with Differential/Platelet  8. Tobacco abuse - CMP14+EGFR - CBC with Differential/Platelet  9. Need for hepatitis C screening test - CMP14+EGFR - CBC with Differential/Platelet - Hepatitis C antibody  10. Pain of right lower extremity - DG Tibia/Fibula Right; Future   Labs pending Health Maintenance reviewed Diet and exercise encouraged  Follow up plan: 1 month    CEvelina Dun FNP

## 2021-05-06 DIAGNOSIS — L89624 Pressure ulcer of left heel, stage 4: Secondary | ICD-10-CM | POA: Diagnosis not present

## 2021-05-06 DIAGNOSIS — R6521 Severe sepsis with septic shock: Secondary | ICD-10-CM | POA: Diagnosis not present

## 2021-05-06 DIAGNOSIS — M86472 Chronic osteomyelitis with draining sinus, left ankle and foot: Secondary | ICD-10-CM | POA: Diagnosis not present

## 2021-05-06 DIAGNOSIS — E43 Unspecified severe protein-calorie malnutrition: Secondary | ICD-10-CM | POA: Diagnosis not present

## 2021-05-06 DIAGNOSIS — L89214 Pressure ulcer of right hip, stage 4: Secondary | ICD-10-CM | POA: Diagnosis not present

## 2021-05-06 DIAGNOSIS — D638 Anemia in other chronic diseases classified elsewhere: Secondary | ICD-10-CM | POA: Diagnosis not present

## 2021-05-06 DIAGNOSIS — L89324 Pressure ulcer of left buttock, stage 4: Secondary | ICD-10-CM | POA: Diagnosis not present

## 2021-05-06 DIAGNOSIS — A419 Sepsis, unspecified organism: Secondary | ICD-10-CM | POA: Diagnosis not present

## 2021-05-06 DIAGNOSIS — L89224 Pressure ulcer of left hip, stage 4: Secondary | ICD-10-CM | POA: Diagnosis not present

## 2021-05-06 DIAGNOSIS — G8220 Paraplegia, unspecified: Secondary | ICD-10-CM | POA: Diagnosis not present

## 2021-05-06 LAB — CMP14+EGFR
ALT: 7 IU/L (ref 0–32)
AST: 13 IU/L (ref 0–40)
Albumin/Globulin Ratio: 0.6 — ABNORMAL LOW (ref 1.2–2.2)
Albumin: 2.6 g/dL — ABNORMAL LOW (ref 3.8–4.8)
Alkaline Phosphatase: 165 IU/L — ABNORMAL HIGH (ref 44–121)
BUN/Creatinine Ratio: 21 (ref 9–23)
BUN: 7 mg/dL (ref 6–24)
Bilirubin Total: <0.2 mg/dL (ref 0.0–1.2)
CO2: 26 mmol/L (ref 20–29)
Calcium: 8.4 mg/dL — ABNORMAL LOW (ref 8.7–10.2)
Chloride: 98 mmol/L (ref 96–106)
Creatinine, Ser: 0.33 mg/dL — ABNORMAL LOW (ref 0.57–1.00)
Globulin, Total: 4.2 g/dL (ref 1.5–4.5)
Glucose: 84 mg/dL (ref 70–99)
Potassium: 4.8 mmol/L (ref 3.5–5.2)
Sodium: 138 mmol/L (ref 134–144)
Total Protein: 6.8 g/dL (ref 6.0–8.5)
eGFR: 132 mL/min/{1.73_m2} (ref >59)

## 2021-05-06 LAB — CBC WITH DIFFERENTIAL/PLATELET
Basophils Absolute: 0 10*3/uL (ref 0.0–0.2)
Basos: 0 %
EOS (ABSOLUTE): 0.3 10*3/uL (ref 0.0–0.4)
Eos: 3 %
Hematocrit: 35 % (ref 34.0–46.6)
Hemoglobin: 11.2 g/dL (ref 11.1–15.9)
Immature Grans (Abs): 0.1 10*3/uL (ref 0.0–0.1)
Immature Granulocytes: 1 %
Lymphocytes Absolute: 2.5 10*3/uL (ref 0.7–3.1)
Lymphs: 29 %
MCH: 28.9 pg (ref 26.6–33.0)
MCHC: 32 g/dL (ref 31.5–35.7)
MCV: 90 fL (ref 79–97)
Monocytes Absolute: 0.4 10*3/uL (ref 0.1–0.9)
Monocytes: 5 %
Neutrophils Absolute: 5.4 10*3/uL (ref 1.4–7.0)
Neutrophils: 62 %
Platelets: 536 10*3/uL — ABNORMAL HIGH (ref 150–450)
RBC: 3.87 x10E6/uL (ref 3.77–5.28)
RDW: 13.6 % (ref 11.7–15.4)
WBC: 8.6 10*3/uL (ref 3.4–10.8)

## 2021-05-06 LAB — HEPATITIS C ANTIBODY: Hep C Virus Ab: <0.1 s/co ratio (ref 0.0–0.9)

## 2021-05-07 DIAGNOSIS — L8944 Pressure ulcer of contiguous site of back, buttock and hip, stage 4: Secondary | ICD-10-CM | POA: Diagnosis not present

## 2021-05-07 DIAGNOSIS — L89624 Pressure ulcer of left heel, stage 4: Secondary | ICD-10-CM | POA: Diagnosis not present

## 2021-05-08 ENCOUNTER — Encounter: Payer: Self-pay | Admitting: Family Medicine

## 2021-05-08 ENCOUNTER — Other Ambulatory Visit: Payer: Self-pay | Admitting: Urology

## 2021-05-08 DIAGNOSIS — E46 Unspecified protein-calorie malnutrition: Secondary | ICD-10-CM | POA: Diagnosis not present

## 2021-05-08 DIAGNOSIS — N319 Neuromuscular dysfunction of bladder, unspecified: Secondary | ICD-10-CM

## 2021-05-08 DIAGNOSIS — L89214 Pressure ulcer of right hip, stage 4: Secondary | ICD-10-CM | POA: Diagnosis not present

## 2021-05-08 DIAGNOSIS — L89224 Pressure ulcer of left hip, stage 4: Secondary | ICD-10-CM | POA: Diagnosis not present

## 2021-05-08 DIAGNOSIS — F17200 Nicotine dependence, unspecified, uncomplicated: Secondary | ICD-10-CM | POA: Diagnosis not present

## 2021-05-08 DIAGNOSIS — M868X8 Other osteomyelitis, other site: Secondary | ICD-10-CM | POA: Diagnosis not present

## 2021-05-08 DIAGNOSIS — D649 Anemia, unspecified: Secondary | ICD-10-CM | POA: Diagnosis not present

## 2021-05-08 DIAGNOSIS — L89624 Pressure ulcer of left heel, stage 4: Secondary | ICD-10-CM | POA: Diagnosis not present

## 2021-05-08 DIAGNOSIS — L89314 Pressure ulcer of right buttock, stage 4: Secondary | ICD-10-CM | POA: Diagnosis not present

## 2021-05-08 DIAGNOSIS — L89614 Pressure ulcer of right heel, stage 4: Secondary | ICD-10-CM | POA: Diagnosis not present

## 2021-05-08 DIAGNOSIS — L89324 Pressure ulcer of left buttock, stage 4: Secondary | ICD-10-CM | POA: Diagnosis not present

## 2021-05-09 ENCOUNTER — Ambulatory Visit (INDEPENDENT_AMBULATORY_CARE_PROVIDER_SITE_OTHER): Payer: Medicare Other | Admitting: Family

## 2021-05-09 ENCOUNTER — Other Ambulatory Visit: Payer: Self-pay

## 2021-05-09 DIAGNOSIS — L89624 Pressure ulcer of left heel, stage 4: Secondary | ICD-10-CM

## 2021-05-09 MED ORDER — SILVER SULFADIAZINE 1 % EX CREA: 1.0000 "application " | TOPICAL_CREAM | Freq: Every day | CUTANEOUS | 0 refills | Status: DC
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
## 2021-05-14 DIAGNOSIS — M86472 Chronic osteomyelitis with draining sinus, left ankle and foot: Secondary | ICD-10-CM | POA: Diagnosis not present

## 2021-05-14 DIAGNOSIS — L89214 Pressure ulcer of right hip, stage 4: Secondary | ICD-10-CM | POA: Diagnosis not present

## 2021-05-14 DIAGNOSIS — L89324 Pressure ulcer of left buttock, stage 4: Secondary | ICD-10-CM | POA: Diagnosis not present

## 2021-05-14 DIAGNOSIS — G8220 Paraplegia, unspecified: Secondary | ICD-10-CM | POA: Diagnosis not present

## 2021-05-14 DIAGNOSIS — R6521 Severe sepsis with septic shock: Secondary | ICD-10-CM | POA: Diagnosis not present

## 2021-05-14 DIAGNOSIS — E43 Unspecified severe protein-calorie malnutrition: Secondary | ICD-10-CM | POA: Diagnosis not present

## 2021-05-14 DIAGNOSIS — L89224 Pressure ulcer of left hip, stage 4: Secondary | ICD-10-CM | POA: Diagnosis not present

## 2021-05-14 DIAGNOSIS — A419 Sepsis, unspecified organism: Secondary | ICD-10-CM | POA: Diagnosis not present

## 2021-05-14 DIAGNOSIS — D638 Anemia in other chronic diseases classified elsewhere: Secondary | ICD-10-CM | POA: Diagnosis not present

## 2021-05-14 DIAGNOSIS — L89624 Pressure ulcer of left heel, stage 4: Secondary | ICD-10-CM | POA: Diagnosis not present

## 2021-05-15 ENCOUNTER — Encounter: Payer: Self-pay | Admitting: Family

## 2021-05-15 ENCOUNTER — Telehealth (INDEPENDENT_AMBULATORY_CARE_PROVIDER_SITE_OTHER): Payer: Medicare Other | Admitting: Family

## 2021-05-15 ENCOUNTER — Other Ambulatory Visit: Payer: Medicare Other

## 2021-05-15 DIAGNOSIS — L89224 Pressure ulcer of left hip, stage 4: Secondary | ICD-10-CM | POA: Diagnosis not present

## 2021-05-15 DIAGNOSIS — D638 Anemia in other chronic diseases classified elsewhere: Secondary | ICD-10-CM | POA: Diagnosis not present

## 2021-05-15 DIAGNOSIS — L8944 Pressure ulcer of contiguous site of back, buttock and hip, stage 4: Secondary | ICD-10-CM | POA: Diagnosis not present

## 2021-05-15 DIAGNOSIS — L89624 Pressure ulcer of left heel, stage 4: Secondary | ICD-10-CM | POA: Diagnosis not present

## 2021-05-15 DIAGNOSIS — G8220 Paraplegia, unspecified: Secondary | ICD-10-CM | POA: Diagnosis not present

## 2021-05-15 DIAGNOSIS — E440 Moderate protein-calorie malnutrition: Secondary | ICD-10-CM

## 2021-05-15 DIAGNOSIS — L89214 Pressure ulcer of right hip, stage 4: Secondary | ICD-10-CM | POA: Diagnosis not present

## 2021-05-15 DIAGNOSIS — A419 Sepsis, unspecified organism: Secondary | ICD-10-CM | POA: Diagnosis not present

## 2021-05-15 DIAGNOSIS — S343XXS Injury of cauda equina, sequela: Secondary | ICD-10-CM

## 2021-05-15 DIAGNOSIS — E43 Unspecified severe protein-calorie malnutrition: Secondary | ICD-10-CM | POA: Diagnosis not present

## 2021-05-15 DIAGNOSIS — Z720 Tobacco use: Secondary | ICD-10-CM | POA: Diagnosis not present

## 2021-05-15 DIAGNOSIS — L89324 Pressure ulcer of left buttock, stage 4: Secondary | ICD-10-CM | POA: Diagnosis not present

## 2021-05-15 DIAGNOSIS — R6521 Severe sepsis with septic shock: Secondary | ICD-10-CM | POA: Diagnosis not present

## 2021-05-15 DIAGNOSIS — M86472 Chronic osteomyelitis with draining sinus, left ankle and foot: Secondary | ICD-10-CM | POA: Diagnosis not present

## 2021-05-15 NOTE — Progress Notes (Signed)
Virtual Visit Consent   Kim [name], you are scheduled for a virtual visit with a [facility] provider today.     Just as with appointments in the office, your consent must be obtained to participate.  Your consent will be active for this visit and any virtual visit you may have with one of our providers in the next 365 days.     If you have a MyChart account, a copy of this consent can be sent to you electronically.  All virtual visits are billed to your insurance company just like a traditional visit in the office.    As this is a virtual visit, video technology does not allow for your provider to perform a traditional examination.  This may limit your provider's ability to fully assess your condition.  If your provider identifies any concerns that need to be evaluated in person or the need to arrange testing (such as labs, EKG, etc.), we will make arrangements to do so.     Although advances in technology are sophisticated, we cannot ensure that it will always work on either your end or our end.  If the connection with a video visit is poor, the visit may have to be switched to a telephone visit.  With either a video or telephone visit, we are not always able to ensure that we have a secure connection.     I need to obtain your verbal consent now.   Are you willing to proceed with your visit today?    Kim [name] has provided verbal consent on 05/15/2021 for a virtual visit (video or telephone).   [name], FNP   Date: 05/15/2021 10:48 AM   Virtual Visit via Video Note   I, [name], connected with  Kim [name]  ([id number], 12-02-1975) on 05/15/21 at 10:10 AM EST by a video-enabled telemedicine application and verified that I am speaking with the correct person using two identifiers.  Location: Patient: Virtual Visit Location Patient: Home Provider: Virtual Visit Location Provider: Home Office   I discussed the limitations of evaluation and management by  telemedicine and the availability of in person appointments. The patient expressed understanding and agreed to proceed.    History of Present Illness: Kim [name] is a 46 y.o. who identifies as a female who was assigned female at birth, and is being seen today for to discuss CT pelvis with contrast on 04/03/21. She received a letter stating she needed to follow up with her PCP from this scan. She has hx of spinal cord injury. She has hx malnutrition and she has tried increasing her protein.   The scan showed, "1. Marked severity anasarca along the anterior and lateral aspects of the abdominal and pelvic wall, with marked severity cellulitis seen throughout the proximal portions of the bilateral lower extremities. 2. 6.2 cm x 4.2 cm x 3.6 cm complex fluid collection and/or abscess seen within the soft tissues posterior and inferior to the rectum. 3. Large soft tissue defects, with associated edema and subcutaneous inflammatory fat stranding, along the posteromedial aspects of the bilateral lower extremities and posterior to the left inferior pubic ramus. While no definite evidence of associated acute cortical destruction is seen, associated acute osteomyelitis cannot be excluded. Further evaluation with a triple phase nuclear medicine bone scan is recommended. 4. Total right hip replacement. 5. Marked severity chronic and degenerative changes within the left hip."  She had a debridement on 04/04/21. She followed by wound care and last  them last week and is followed by monthly. She has home health coming three times a week and getting a wound vac today.   She is followed by Ortho and saw them on 05/09/21. She has hx of left hip replacement and partial of this replacement is exposed and left heel pressure ulcer.   She is in the process of getting an electric wheelchair that can move up and down. She is wearing bilateral boots to prevent pressure.   She is doing PT and OT  several times  a week.  HPI: HPI  Problems:  Patient Active Problem List   Diagnosis Date Noted   Tobacco abuse 04/22/2021   Malnutrition of moderate degree 04/16/2021   Chronic osteomyelitis of left foot with draining sinus (HCC)    Severe protein-calorie malnutrition ([place]) 04/08/2021   Pressure injury of contiguous region involving right buttock and hip, stage 4 ([place]) 02/10/2021   Pressure injury of left heel, stage 4 ([place]) 02/10/2021   Non-healing open wound of heel, initial encounter 04/12/2020   Encounter for routine gynecological examination with Papanicolaou smear of cervix 03/03/2016   Enlarged uterus 03/03/2016   Postmenopausal 03/03/2016   GAD (generalized anxiety disorder) 01/21/2016   Osteopenia    Vitamin D deficiency 05/01/2014   Heterotopic ossification of bone 08/02/2013   Hip pain, bilateral 08/02/2013   Osteoarthritis of left knee 08/02/2013   Cauda equina spinal cord injury ([place]) 06/16/2013   KNEE PAIN 05/23/2007   SPONDYLOSIS WITH MYELOPATHY LUMBAR REGION 05/23/2007    Allergies:  Allergies  Allergen Reactions   Keflex [Cephalexin] Diarrhea   Medications:  Current Outpatient Medications:    Ascorbic Acid (VITAMIN C) 500 MG CAPS, Take 500 mg by mouth daily., Disp: 90 capsule, Rfl: 11   Buprenorphine HCl-Naloxone HCl 8-2 MG FILM, Place 1 Film under the tongue 3 (three) times daily., Disp: , Rfl:    CALCIUM-VITAMIN D PO, Take 1 tablet by mouth daily., Disp: , Rfl:    clonazePAM (KLONOPIN) 0.5 MG tablet, Take 0.5 mg by mouth 2 (two) times daily., Disp: , Rfl:    collagenase (SANTYL) ointment, Apply topically daily., Disp: 30 g, Rfl: 0   Elastic Bandages & Supports (HEEL/ANKLE PROTECTOR) MISC, 1 each by Does not apply route at bedtime., Disp: 2 each, Rfl: 1   ferrous sulfate 325 (65 FE) MG tablet, Take 1 tablet (325 mg total) by mouth 2 (two) times daily with a meal., Disp: 60 tablet, Rfl: 0   folic acid (FOLVITE) 1 MG tablet, Take 1 tablet (1 mg total) by mouth daily., Disp:  30 tablet, Rfl: 0   Foot Care Products (SOCK AID) MISC, 1 Device by Does not apply route daily., Disp: 1 each, Rfl: 0   gabapentin (NEURONTIN) 300 MG capsule, Take 600 mg by mouth 2 (two) times daily., Disp: , Rfl:    gabapentin (NEURONTIN) 300 MG capsule, Take by mouth., Disp: , Rfl:    midodrine (PROAMATINE) 10 MG tablet, Take 1 tablet (10 mg total) by mouth 3 (three) times daily with meals., Disp: 90 tablet, Rfl: 0   Misc. Devices (BED WEDGE) MISC, 1 each by Does not apply route 2 (two) times daily., Disp: 2 each, Rfl: 0   Misc. Devices (TRANSFER BENCH) MISC, 1 Device by Does not apply route as needed., Disp: 1 each, Rfl: 1   Misc. Devices [facility] CUSHION) MISC, 1 application by Does not apply route daily., Disp: 1 each, Rfl: 1   Multiple Vitamins-Calcium (ONE-A-DAY WOMENS PO), Take 2 tablets  by mouth daily., Disp: , Rfl:    NARCAN 4 MG/0.1ML LIQD nasal spray kit, , Disp: , Rfl:    Nutritional Supplements (ENSURE HIGH PROTEIN) LIQD, Take 237 mLs by mouth 4 (four) times daily - after meals and at bedtime., Disp: 3792 mL, Rfl: 11   Nutritional Supplements (JUVEN NUTRIVIGOR) PACK, Take 1 each by mouth 3 (three) times daily., Disp: 180 each, Rfl: 3   Nutritional Supplements (PROMOD) LIQD, Take 30 mLs by mouth 2 (two) times daily., Disp: 946 mL, Rfl: 11   Potassium 75 MG TABS, Take 1 tablet by mouth daily., Disp: , Rfl:    silver sulfADIAZINE (SILVADENE) 1 % cream, Apply 1 application topically daily., Disp: 50 g, Rfl: 0   Zinc 50 MG CAPS, Take 1 capsule (50 mg total) by mouth daily., Disp: 90 capsule, Rfl: 1  Observations/Objective: Patient is well-developed, well-nourished in no acute distress.  Resting comfortably  at home.  Head is normocephalic, atraumatic.  No labored breathing.  Speech is clear and coherent with logical content.  Patient is alert and oriented at baseline.  Pt sitting in wheelchair Wounds have dressing and intact  Assessment and Plan: 1. Cauda equina spinal cord  injury, sequela  2. Malnutrition of moderate degree  3. Pressure injury of contiguous region involving right buttock and hip, stage 4 ([place])  4. Pressure injury of left heel, stage 4 (HCC)  5. Tobacco abuse  Keep follow up with wound care and Ortho Continue PT and OT Wound vac to be placed today Keep follow up Increase protein in diet  Follow Up Instructions: I discussed the assessment and treatment plan with the patient. The patient was provided an opportunity to ask questions and all were answered. The patient agreed with the plan and demonstrated an understanding of the instructions.  A copy of instructions were sent to the patient via MyChart unless otherwise noted below.    The patient was advised to call back or seek an in-person evaluation if the symptoms worsen or if the condition fails to improve as anticipated.  Time:  I spent 18 minutes with the patient via telehealth technology discussing the above problems/concerns.    [name], FNP

## 2021-05-16 ENCOUNTER — Encounter: Payer: Self-pay | Admitting: Family

## 2021-05-16 DIAGNOSIS — E43 Unspecified severe protein-calorie malnutrition: Secondary | ICD-10-CM | POA: Diagnosis not present

## 2021-05-16 DIAGNOSIS — A419 Sepsis, unspecified organism: Secondary | ICD-10-CM | POA: Diagnosis not present

## 2021-05-16 DIAGNOSIS — G8220 Paraplegia, unspecified: Secondary | ICD-10-CM | POA: Diagnosis not present

## 2021-05-16 DIAGNOSIS — D638 Anemia in other chronic diseases classified elsewhere: Secondary | ICD-10-CM | POA: Diagnosis not present

## 2021-05-16 DIAGNOSIS — L89224 Pressure ulcer of left hip, stage 4: Secondary | ICD-10-CM | POA: Diagnosis not present

## 2021-05-16 DIAGNOSIS — L89324 Pressure ulcer of left buttock, stage 4: Secondary | ICD-10-CM | POA: Diagnosis not present

## 2021-05-16 DIAGNOSIS — R6521 Severe sepsis with septic shock: Secondary | ICD-10-CM | POA: Diagnosis not present

## 2021-05-16 DIAGNOSIS — L89624 Pressure ulcer of left heel, stage 4: Secondary | ICD-10-CM | POA: Diagnosis not present

## 2021-05-16 DIAGNOSIS — L89214 Pressure ulcer of right hip, stage 4: Secondary | ICD-10-CM | POA: Diagnosis not present

## 2021-05-16 DIAGNOSIS — M86472 Chronic osteomyelitis with draining sinus, left ankle and foot: Secondary | ICD-10-CM | POA: Diagnosis not present

## 2021-05-19 ENCOUNTER — Ambulatory Visit (INDEPENDENT_AMBULATORY_CARE_PROVIDER_SITE_OTHER): Payer: Medicare Other

## 2021-05-19 VITALS — Wt 142.0 lb

## 2021-05-19 DIAGNOSIS — Z0000 Encounter for general adult medical examination without abnormal findings: Secondary | ICD-10-CM | POA: Diagnosis not present

## 2021-05-19 NOTE — Patient Instructions (Signed)
[name] , Thank you for taking time to come for your Medicare Wellness Visit. I appreciate your ongoing commitment to your health goals. Please review the following plan we discussed and let me know if I can assist you in the future.   Screening recommendations/referrals: Colonoscopy: Due at age 44 Mammogram: Due every year - call us for appointment when you are available. Bone Density: Done 05/21/2015 - Repeat every 2 years *past due Recommended yearly ophthalmology/optometry visit for glaucoma screening and checkup Recommended yearly dental visit for hygiene and checkup  Vaccinations: Influenza vaccine: Done 05/05/2021 - repeat every fall Pneumococcal vaccine: Due after age 54 Tdap vaccine: Done 05/01/2014 - Repeat in 10 years Shingles vaccine: due after age 3  Covid-19: Declined  Advanced directives: Please bring a copy of your health care power of attorney and living will to the office to be added to your chart at your convenience.   Conditions/risks identified: Aim for 30 minutes of exercise or brisk walking each day, drink 6-8 glasses of water and eat lots of fruits and vegetables.   Next appointment: Follow up in one year for your annual wellness visit.   Preventive Care 40-64 Years, Female Preventive care refers to lifestyle choices and visits with your health care provider that can promote health and wellness. What does preventive care include? A yearly physical exam. This is also called an annual well check. Dental exams once or twice a year. Routine eye exams. Ask your health care provider how often you should have your eyes checked. Personal lifestyle choices, including: Daily care of your teeth and gums. Regular physical activity. Eating a healthy diet. Avoiding tobacco and drug use. Limiting alcohol use. Practicing safe sex. Taking low-dose aspirin daily starting at age 38. Taking vitamin and mineral supplements as recommended by your health care provider. What  happens during an annual well check? The services and screenings done by your health care provider during your annual well check will depend on your age, overall health, lifestyle risk factors, and family history of disease. Counseling  Your health care provider may ask you questions about your: Alcohol use. Tobacco use. Drug use. Emotional well-being. Home and relationship well-being. Sexual activity. Eating habits. Work and work Statistician. Method of birth control. Menstrual cycle. Pregnancy history. Screening  You may have the following tests or measurements: Height, weight, and BMI. Blood pressure. Lipid and cholesterol levels. These may be checked every 5 years, or more frequently if you are over 68 years old. Skin check. Lung cancer screening. You may have this screening every year starting at age 45 if you have a 30-pack-year history of smoking and currently smoke or have quit within the past 15 years. Fecal occult blood test (FOBT) of the stool. You may have this test every year starting at age 16. Flexible sigmoidoscopy or colonoscopy. You may have a sigmoidoscopy every 5 years or a colonoscopy every 10 years starting at age 3. Hepatitis C blood test. Hepatitis B blood test. Sexually transmitted disease (STD) testing. Diabetes screening. This is done by checking your blood sugar (glucose) after you have not eaten for a while (fasting). You may have this done every 1-3 years. Mammogram. This may be done every 1-2 years. Talk to your health care provider about when you should start having regular mammograms. This may depend on whether you have a family history of breast cancer. BRCA-related cancer screening. This may be done if you have a family history of breast, ovarian, tubal, or peritoneal cancers. Pelvic  exam and Pap test. This may be done every 3 years starting at age 10. Starting at age 41, this may be done every 5 years if you have a Pap test in combination with an HPV  test. Bone density scan. This is done to screen for osteoporosis. You may have this scan if you are at high risk for osteoporosis. Discuss your test results, treatment options, and if necessary, the need for more tests with your health care provider. Vaccines  Your health care provider may recommend certain vaccines, such as: Influenza vaccine. This is recommended every year. Tetanus, diphtheria, and acellular pertussis (Tdap, Td) vaccine. You may need a Td booster every 10 years. Zoster vaccine. You may need this after age 23. Pneumococcal 13-valent conjugate (PCV13) vaccine. You may need this if you have certain conditions and were not previously vaccinated. Pneumococcal polysaccharide (PPSV23) vaccine. You may need one or two doses if you smoke cigarettes or if you have certain conditions. Talk to your health care provider about which screenings and vaccines you need and how often you need them. This information is not intended to replace advice given to you by your health care provider. Make sure you discuss any questions you have with your health care provider. Document Released: 04/19/2015 Document Revised: 12/11/2015 Document Reviewed: 01/22/2015 Elsevier Interactive Patient Education  2017 [employer] Prevention in the Home Falls can cause injuries. They can happen to people of all ages. There are many things you can do to make your home safe and to help prevent falls. What can I do on the outside of my home? Regularly fix the edges of walkways and driveways and fix any cracks. Remove anything that might make you trip as you walk through a door, such as a raised step or threshold. Trim any bushes or trees on the path to your home. Use bright outdoor lighting. Clear any walking paths of anything that might make someone trip, such as rocks or tools. Regularly check to see if handrails are loose or broken. Make sure that both sides of any steps have handrails. Any raised  decks and porches should have guardrails on the edges. Have any leaves, snow, or ice cleared regularly. Use sand or salt on walking paths during winter. Clean up any spills in your garage right away. This includes oil or grease spills. What can I do in the bathroom? Use night lights. Install grab bars by the toilet and in the tub and shower. Do not use towel bars as grab bars. Use non-skid mats or decals in the tub or shower. If you need to sit down in the shower, use a plastic, non-slip stool. Keep the floor dry. Clean up any water that spills on the floor as soon as it happens. Remove soap buildup in the tub or shower regularly. Attach bath mats securely with double-sided non-slip rug tape. Do not have throw rugs and other things on the floor that can make you trip. What can I do in the bedroom? Use night lights. Make sure that you have a light by your bed that is easy to reach. Do not use any sheets or blankets that are too big for your bed. They should not hang down onto the floor. Have a firm chair that has side arms. You can use this for support while you get dressed. Do not have throw rugs and other things on the floor that can make you trip. What can I do in the kitchen? Clean  up any spills right away. Avoid walking on wet floors. Keep items that you use a lot in easy-to-reach places. If you need to reach something above you, use a strong step stool that has a grab bar. Keep electrical cords out of the way. Do not use floor polish or wax that makes floors slippery. If you must use wax, use non-skid floor wax. Do not have throw rugs and other things on the floor that can make you trip. What can I do with my stairs? Do not leave any items on the stairs. Make sure that there are handrails on both sides of the stairs and use them. Fix handrails that are broken or loose. Make sure that handrails are as long as the stairways. Check any carpeting to make sure that it is firmly attached  to the stairs. Fix any carpet that is loose or worn. Avoid having throw rugs at the top or bottom of the stairs. If you do have throw rugs, attach them to the floor with carpet tape. Make sure that you have a light switch at the top of the stairs and the bottom of the stairs. If you do not have them, ask someone to add them for you. What else can I do to help prevent falls? Wear shoes that: Do not have high heels. Have rubber bottoms. Are comfortable and fit you well. Are closed at the toe. Do not wear sandals. If you use a stepladder: Make sure that it is fully opened. Do not climb a closed stepladder. Make sure that both sides of the stepladder are locked into place. Ask someone to hold it for you, if possible. Clearly mark and make sure that you can see: Any grab bars or handrails. First and last steps. Where the edge of each step is. Use tools that help you move around (mobility aids) if they are needed. These include: Canes. Walkers. Scooters. Crutches. Turn on the lights when you go into a dark area. Replace any light bulbs as soon as they burn out. Set up your furniture so you have a clear path. Avoid moving your furniture around. If any of your floors are uneven, fix them. If there are any pets around you, be aware of where they are. Review your medicines with your doctor. Some medicines can make you feel dizzy. This can increase your chance of falling. Ask your doctor what other things that you can do to help prevent falls. This information is not intended to replace advice given to you by your health care provider. Make sure you discuss any questions you have with your health care provider. Document Released: 01/17/2009 Document Revised: 08/29/2015 Document Reviewed: 04/27/2014 Elsevier Interactive Patient Education  2017 [employer].
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
## 2021-05-20 ENCOUNTER — Telehealth: Payer: Self-pay | Admitting: Family Medicine

## 2021-05-20 DIAGNOSIS — D638 Anemia in other chronic diseases classified elsewhere: Secondary | ICD-10-CM | POA: Diagnosis not present

## 2021-05-20 DIAGNOSIS — A419 Sepsis, unspecified organism: Secondary | ICD-10-CM | POA: Diagnosis not present

## 2021-05-20 DIAGNOSIS — S39012D Strain of muscle, fascia and tendon of lower back, subsequent encounter: Secondary | ICD-10-CM | POA: Diagnosis not present

## 2021-05-20 DIAGNOSIS — L89624 Pressure ulcer of left heel, stage 4: Secondary | ICD-10-CM | POA: Diagnosis not present

## 2021-05-20 DIAGNOSIS — L89324 Pressure ulcer of left buttock, stage 4: Secondary | ICD-10-CM | POA: Diagnosis not present

## 2021-05-20 DIAGNOSIS — L89224 Pressure ulcer of left hip, stage 4: Secondary | ICD-10-CM | POA: Diagnosis not present

## 2021-05-20 DIAGNOSIS — L89214 Pressure ulcer of right hip, stage 4: Secondary | ICD-10-CM | POA: Diagnosis not present

## 2021-05-20 DIAGNOSIS — R6521 Severe sepsis with septic shock: Secondary | ICD-10-CM | POA: Diagnosis not present

## 2021-05-20 DIAGNOSIS — M86472 Chronic osteomyelitis with draining sinus, left ankle and foot: Secondary | ICD-10-CM | POA: Diagnosis not present

## 2021-05-20 DIAGNOSIS — G8220 Paraplegia, unspecified: Secondary | ICD-10-CM | POA: Diagnosis not present

## 2021-05-20 DIAGNOSIS — E43 Unspecified severe protein-calorie malnutrition: Secondary | ICD-10-CM | POA: Diagnosis not present

## 2021-05-20 DIAGNOSIS — M4716 Other spondylosis with myelopathy, lumbar region: Secondary | ICD-10-CM | POA: Diagnosis not present

## 2021-05-20 NOTE — Telephone Encounter (Signed)
She is asymptomatic, will defer to PCP for further evaluation

## 2021-05-20 NOTE — Telephone Encounter (Signed)
Pt states patient pulse rate today is 121 and BP is 114/80. Patient has no SOB or no complaints at this time. Pt just wanted to let you know her pulse. Looks like it was elevated at her last visit here as well. Pt has seen her 1 other time before and it was elevated the. Call patient if we want her to change anything at this time.

## 2021-05-21 ENCOUNTER — Other Ambulatory Visit: Payer: Medicare Other

## 2021-05-21 DIAGNOSIS — M86472 Chronic osteomyelitis with draining sinus, left ankle and foot: Secondary | ICD-10-CM | POA: Diagnosis not present

## 2021-05-21 DIAGNOSIS — L89324 Pressure ulcer of left buttock, stage 4: Secondary | ICD-10-CM | POA: Diagnosis not present

## 2021-05-21 DIAGNOSIS — A419 Sepsis, unspecified organism: Secondary | ICD-10-CM | POA: Diagnosis not present

## 2021-05-21 DIAGNOSIS — R6521 Severe sepsis with septic shock: Secondary | ICD-10-CM | POA: Diagnosis not present

## 2021-05-21 DIAGNOSIS — E43 Unspecified severe protein-calorie malnutrition: Secondary | ICD-10-CM | POA: Diagnosis not present

## 2021-05-21 DIAGNOSIS — L89214 Pressure ulcer of right hip, stage 4: Secondary | ICD-10-CM | POA: Diagnosis not present

## 2021-05-21 DIAGNOSIS — D638 Anemia in other chronic diseases classified elsewhere: Secondary | ICD-10-CM | POA: Diagnosis not present

## 2021-05-21 DIAGNOSIS — L89624 Pressure ulcer of left heel, stage 4: Secondary | ICD-10-CM | POA: Diagnosis not present

## 2021-05-21 DIAGNOSIS — G8220 Paraplegia, unspecified: Secondary | ICD-10-CM | POA: Diagnosis not present

## 2021-05-21 DIAGNOSIS — L89224 Pressure ulcer of left hip, stage 4: Secondary | ICD-10-CM | POA: Diagnosis not present

## 2021-05-22 DIAGNOSIS — L89324 Pressure ulcer of left buttock, stage 4: Secondary | ICD-10-CM | POA: Diagnosis not present

## 2021-05-22 DIAGNOSIS — L89624 Pressure ulcer of left heel, stage 4: Secondary | ICD-10-CM | POA: Diagnosis not present

## 2021-05-22 DIAGNOSIS — D638 Anemia in other chronic diseases classified elsewhere: Secondary | ICD-10-CM | POA: Diagnosis not present

## 2021-05-22 DIAGNOSIS — G8220 Paraplegia, unspecified: Secondary | ICD-10-CM | POA: Diagnosis not present

## 2021-05-22 DIAGNOSIS — A419 Sepsis, unspecified organism: Secondary | ICD-10-CM | POA: Diagnosis not present

## 2021-05-22 DIAGNOSIS — L89224 Pressure ulcer of left hip, stage 4: Secondary | ICD-10-CM | POA: Diagnosis not present

## 2021-05-22 DIAGNOSIS — Z03818 Encounter for observation for suspected exposure to other biological agents ruled out: Secondary | ICD-10-CM | POA: Diagnosis not present

## 2021-05-22 DIAGNOSIS — L89214 Pressure ulcer of right hip, stage 4: Secondary | ICD-10-CM | POA: Diagnosis not present

## 2021-05-22 DIAGNOSIS — R6521 Severe sepsis with septic shock: Secondary | ICD-10-CM | POA: Diagnosis not present

## 2021-05-22 DIAGNOSIS — M86472 Chronic osteomyelitis with draining sinus, left ankle and foot: Secondary | ICD-10-CM | POA: Diagnosis not present

## 2021-05-22 DIAGNOSIS — E43 Unspecified severe protein-calorie malnutrition: Secondary | ICD-10-CM | POA: Diagnosis not present

## 2021-05-22 NOTE — Telephone Encounter (Signed)
NA

## 2021-05-22 NOTE — Telephone Encounter (Signed)
Please have her make an appt to be seen in person for EKG.

## 2021-05-23 DIAGNOSIS — D638 Anemia in other chronic diseases classified elsewhere: Secondary | ICD-10-CM | POA: Diagnosis not present

## 2021-05-23 DIAGNOSIS — E43 Unspecified severe protein-calorie malnutrition: Secondary | ICD-10-CM | POA: Diagnosis not present

## 2021-05-23 DIAGNOSIS — L89324 Pressure ulcer of left buttock, stage 4: Secondary | ICD-10-CM | POA: Diagnosis not present

## 2021-05-23 DIAGNOSIS — L89624 Pressure ulcer of left heel, stage 4: Secondary | ICD-10-CM | POA: Diagnosis not present

## 2021-05-23 DIAGNOSIS — R300 Dysuria: Secondary | ICD-10-CM | POA: Diagnosis not present

## 2021-05-23 DIAGNOSIS — M86472 Chronic osteomyelitis with draining sinus, left ankle and foot: Secondary | ICD-10-CM | POA: Diagnosis not present

## 2021-05-23 DIAGNOSIS — L89214 Pressure ulcer of right hip, stage 4: Secondary | ICD-10-CM | POA: Diagnosis not present

## 2021-05-23 DIAGNOSIS — R6521 Severe sepsis with septic shock: Secondary | ICD-10-CM | POA: Diagnosis not present

## 2021-05-23 DIAGNOSIS — A419 Sepsis, unspecified organism: Secondary | ICD-10-CM | POA: Diagnosis not present

## 2021-05-23 DIAGNOSIS — L89224 Pressure ulcer of left hip, stage 4: Secondary | ICD-10-CM | POA: Diagnosis not present

## 2021-05-23 DIAGNOSIS — G8220 Paraplegia, unspecified: Secondary | ICD-10-CM | POA: Diagnosis not present

## 2021-05-25 DIAGNOSIS — L89324 Pressure ulcer of left buttock, stage 4: Secondary | ICD-10-CM | POA: Diagnosis not present

## 2021-05-25 DIAGNOSIS — G8220 Paraplegia, unspecified: Secondary | ICD-10-CM | POA: Diagnosis not present

## 2021-05-25 DIAGNOSIS — M86472 Chronic osteomyelitis with draining sinus, left ankle and foot: Secondary | ICD-10-CM | POA: Diagnosis not present

## 2021-05-25 DIAGNOSIS — A419 Sepsis, unspecified organism: Secondary | ICD-10-CM | POA: Diagnosis not present

## 2021-05-25 DIAGNOSIS — L89214 Pressure ulcer of right hip, stage 4: Secondary | ICD-10-CM | POA: Diagnosis not present

## 2021-05-25 DIAGNOSIS — D638 Anemia in other chronic diseases classified elsewhere: Secondary | ICD-10-CM | POA: Diagnosis not present

## 2021-05-25 DIAGNOSIS — R6521 Severe sepsis with septic shock: Secondary | ICD-10-CM | POA: Diagnosis not present

## 2021-05-25 DIAGNOSIS — E43 Unspecified severe protein-calorie malnutrition: Secondary | ICD-10-CM | POA: Diagnosis not present

## 2021-05-25 DIAGNOSIS — L89224 Pressure ulcer of left hip, stage 4: Secondary | ICD-10-CM | POA: Diagnosis not present

## 2021-05-25 DIAGNOSIS — L89624 Pressure ulcer of left heel, stage 4: Secondary | ICD-10-CM | POA: Diagnosis not present

## 2021-05-28 DIAGNOSIS — L89214 Pressure ulcer of right hip, stage 4: Secondary | ICD-10-CM | POA: Diagnosis not present

## 2021-05-28 DIAGNOSIS — L89224 Pressure ulcer of left hip, stage 4: Secondary | ICD-10-CM | POA: Diagnosis not present

## 2021-05-28 DIAGNOSIS — L89624 Pressure ulcer of left heel, stage 4: Secondary | ICD-10-CM | POA: Diagnosis not present

## 2021-05-29 ENCOUNTER — Telehealth: Payer: Self-pay | Admitting: *Deleted

## 2021-05-29 DIAGNOSIS — R6521 Severe sepsis with septic shock: Secondary | ICD-10-CM | POA: Diagnosis not present

## 2021-05-29 DIAGNOSIS — G8220 Paraplegia, unspecified: Secondary | ICD-10-CM | POA: Diagnosis not present

## 2021-05-29 DIAGNOSIS — D638 Anemia in other chronic diseases classified elsewhere: Secondary | ICD-10-CM | POA: Diagnosis not present

## 2021-05-29 DIAGNOSIS — A419 Sepsis, unspecified organism: Secondary | ICD-10-CM | POA: Diagnosis not present

## 2021-05-29 DIAGNOSIS — L89224 Pressure ulcer of left hip, stage 4: Secondary | ICD-10-CM | POA: Diagnosis not present

## 2021-05-29 DIAGNOSIS — E43 Unspecified severe protein-calorie malnutrition: Secondary | ICD-10-CM | POA: Diagnosis not present

## 2021-05-29 DIAGNOSIS — L89624 Pressure ulcer of left heel, stage 4: Secondary | ICD-10-CM | POA: Diagnosis not present

## 2021-05-29 DIAGNOSIS — L89324 Pressure ulcer of left buttock, stage 4: Secondary | ICD-10-CM | POA: Diagnosis not present

## 2021-05-29 DIAGNOSIS — L89214 Pressure ulcer of right hip, stage 4: Secondary | ICD-10-CM | POA: Diagnosis not present

## 2021-05-29 DIAGNOSIS — M86472 Chronic osteomyelitis with draining sinus, left ankle and foot: Secondary | ICD-10-CM | POA: Diagnosis not present

## 2021-05-29 MED ORDER — POLYETHYLENE GLYCOL 3350 17 GM/SCOOP PO POWD: 17.0000 g | Freq: Every day | ORAL | 1 refills | Status: AC

## 2021-05-29 NOTE — Addendum Note (Signed)
Addended by: [name] A on: 05/29/2021 01:16 PM   Modules accepted: Orders

## 2021-05-29 NOTE — Telephone Encounter (Signed)
Patient aware and verbalized understanding. °

## 2021-05-29 NOTE — Telephone Encounter (Signed)
Miralax daily. Force fluids.   [name], FNP

## 2021-05-29 NOTE — Telephone Encounter (Signed)
NA

## 2021-05-29 NOTE — Telephone Encounter (Signed)
TC from Tammy w/ [facility] Pt rest HR 104 w/ c/o 7/10 pain in low back She feels constipated, had a small bowel movement, has been eating more. Would like suggestion for constipation that will not complicate the wounds on her bottom. She does have a [place] coming this afternoon Please call Edye with advise

## 2021-06-02 NOTE — Telephone Encounter (Signed)
No answer, no voicemail.

## 2021-06-03 DIAGNOSIS — L89324 Pressure ulcer of left buttock, stage 4: Secondary | ICD-10-CM | POA: Diagnosis not present

## 2021-06-03 DIAGNOSIS — L89224 Pressure ulcer of left hip, stage 4: Secondary | ICD-10-CM | POA: Diagnosis not present

## 2021-06-03 DIAGNOSIS — D638 Anemia in other chronic diseases classified elsewhere: Secondary | ICD-10-CM | POA: Diagnosis not present

## 2021-06-03 DIAGNOSIS — R6521 Severe sepsis with septic shock: Secondary | ICD-10-CM | POA: Diagnosis not present

## 2021-06-03 DIAGNOSIS — G8220 Paraplegia, unspecified: Secondary | ICD-10-CM | POA: Diagnosis not present

## 2021-06-03 DIAGNOSIS — M86472 Chronic osteomyelitis with draining sinus, left ankle and foot: Secondary | ICD-10-CM | POA: Diagnosis not present

## 2021-06-03 DIAGNOSIS — A419 Sepsis, unspecified organism: Secondary | ICD-10-CM | POA: Diagnosis not present

## 2021-06-03 DIAGNOSIS — L89624 Pressure ulcer of left heel, stage 4: Secondary | ICD-10-CM | POA: Diagnosis not present

## 2021-06-03 DIAGNOSIS — L89214 Pressure ulcer of right hip, stage 4: Secondary | ICD-10-CM | POA: Diagnosis not present

## 2021-06-03 DIAGNOSIS — E43 Unspecified severe protein-calorie malnutrition: Secondary | ICD-10-CM | POA: Diagnosis not present

## 2021-06-04 DIAGNOSIS — L89224 Pressure ulcer of left hip, stage 4: Secondary | ICD-10-CM | POA: Diagnosis not present

## 2021-06-04 DIAGNOSIS — R339 Retention of urine, unspecified: Secondary | ICD-10-CM | POA: Diagnosis not present

## 2021-06-04 NOTE — Telephone Encounter (Signed)
3rd call no left VM no return call was made will close encounter FYI  ?

## 2021-06-05 DIAGNOSIS — L89314 Pressure ulcer of right buttock, stage 4: Secondary | ICD-10-CM | POA: Diagnosis not present

## 2021-06-05 DIAGNOSIS — L89214 Pressure ulcer of right hip, stage 4: Secondary | ICD-10-CM | POA: Diagnosis not present

## 2021-06-05 DIAGNOSIS — L89224 Pressure ulcer of left hip, stage 4: Secondary | ICD-10-CM | POA: Diagnosis not present

## 2021-06-05 DIAGNOSIS — F17210 Nicotine dependence, cigarettes, uncomplicated: Secondary | ICD-10-CM | POA: Diagnosis not present

## 2021-06-05 DIAGNOSIS — L89624 Pressure ulcer of left heel, stage 4: Secondary | ICD-10-CM | POA: Diagnosis not present

## 2021-06-05 DIAGNOSIS — M868X7 Other osteomyelitis, ankle and foot: Secondary | ICD-10-CM | POA: Diagnosis not present

## 2021-06-05 DIAGNOSIS — E46 Unspecified protein-calorie malnutrition: Secondary | ICD-10-CM | POA: Diagnosis not present

## 2021-06-05 DIAGNOSIS — L89324 Pressure ulcer of left buttock, stage 4: Secondary | ICD-10-CM | POA: Diagnosis not present

## 2021-06-05 DIAGNOSIS — D649 Anemia, unspecified: Secondary | ICD-10-CM | POA: Diagnosis not present

## 2021-06-06 DIAGNOSIS — A419 Sepsis, unspecified organism: Secondary | ICD-10-CM | POA: Diagnosis not present

## 2021-06-06 DIAGNOSIS — D638 Anemia in other chronic diseases classified elsewhere: Secondary | ICD-10-CM | POA: Diagnosis not present

## 2021-06-06 DIAGNOSIS — R6521 Severe sepsis with septic shock: Secondary | ICD-10-CM | POA: Diagnosis not present

## 2021-06-06 DIAGNOSIS — L89214 Pressure ulcer of right hip, stage 4: Secondary | ICD-10-CM | POA: Diagnosis not present

## 2021-06-06 DIAGNOSIS — E43 Unspecified severe protein-calorie malnutrition: Secondary | ICD-10-CM | POA: Diagnosis not present

## 2021-06-06 DIAGNOSIS — L89624 Pressure ulcer of left heel, stage 4: Secondary | ICD-10-CM | POA: Diagnosis not present

## 2021-06-06 DIAGNOSIS — G8220 Paraplegia, unspecified: Secondary | ICD-10-CM | POA: Diagnosis not present

## 2021-06-06 DIAGNOSIS — L89324 Pressure ulcer of left buttock, stage 4: Secondary | ICD-10-CM | POA: Diagnosis not present

## 2021-06-06 DIAGNOSIS — L89224 Pressure ulcer of left hip, stage 4: Secondary | ICD-10-CM | POA: Diagnosis not present

## 2021-06-06 DIAGNOSIS — M86472 Chronic osteomyelitis with draining sinus, left ankle and foot: Secondary | ICD-10-CM | POA: Diagnosis not present

## 2021-06-09 DIAGNOSIS — L89214 Pressure ulcer of right hip, stage 4: Secondary | ICD-10-CM | POA: Diagnosis not present

## 2021-06-09 DIAGNOSIS — G8220 Paraplegia, unspecified: Secondary | ICD-10-CM | POA: Diagnosis not present

## 2021-06-09 DIAGNOSIS — D638 Anemia in other chronic diseases classified elsewhere: Secondary | ICD-10-CM | POA: Diagnosis not present

## 2021-06-09 DIAGNOSIS — E43 Unspecified severe protein-calorie malnutrition: Secondary | ICD-10-CM | POA: Diagnosis not present

## 2021-06-09 DIAGNOSIS — M86472 Chronic osteomyelitis with draining sinus, left ankle and foot: Secondary | ICD-10-CM | POA: Diagnosis not present

## 2021-06-09 DIAGNOSIS — L89224 Pressure ulcer of left hip, stage 4: Secondary | ICD-10-CM | POA: Diagnosis not present

## 2021-06-09 DIAGNOSIS — L89624 Pressure ulcer of left heel, stage 4: Secondary | ICD-10-CM | POA: Diagnosis not present

## 2021-06-09 DIAGNOSIS — R6521 Severe sepsis with septic shock: Secondary | ICD-10-CM | POA: Diagnosis not present

## 2021-06-09 DIAGNOSIS — L89324 Pressure ulcer of left buttock, stage 4: Secondary | ICD-10-CM | POA: Diagnosis not present

## 2021-06-09 DIAGNOSIS — A419 Sepsis, unspecified organism: Secondary | ICD-10-CM | POA: Diagnosis not present

## 2021-06-13 ENCOUNTER — Other Ambulatory Visit: Payer: Self-pay | Admitting: Family

## 2021-06-13 DIAGNOSIS — S39012D Strain of muscle, fascia and tendon of lower back, subsequent encounter: Secondary | ICD-10-CM | POA: Diagnosis not present

## 2021-06-13 DIAGNOSIS — M4716 Other spondylosis with myelopathy, lumbar region: Secondary | ICD-10-CM | POA: Diagnosis not present

## 2021-06-13 DIAGNOSIS — L89324 Pressure ulcer of left buttock, stage 4: Secondary | ICD-10-CM | POA: Diagnosis not present

## 2021-06-13 DIAGNOSIS — G8220 Paraplegia, unspecified: Secondary | ICD-10-CM | POA: Diagnosis not present

## 2021-06-13 DIAGNOSIS — D638 Anemia in other chronic diseases classified elsewhere: Secondary | ICD-10-CM | POA: Diagnosis not present

## 2021-06-13 DIAGNOSIS — R6521 Severe sepsis with septic shock: Secondary | ICD-10-CM | POA: Diagnosis not present

## 2021-06-13 DIAGNOSIS — E43 Unspecified severe protein-calorie malnutrition: Secondary | ICD-10-CM | POA: Diagnosis not present

## 2021-06-13 DIAGNOSIS — A419 Sepsis, unspecified organism: Secondary | ICD-10-CM | POA: Diagnosis not present

## 2021-06-13 DIAGNOSIS — L89624 Pressure ulcer of left heel, stage 4: Secondary | ICD-10-CM | POA: Diagnosis not present

## 2021-06-13 DIAGNOSIS — L89224 Pressure ulcer of left hip, stage 4: Secondary | ICD-10-CM | POA: Diagnosis not present

## 2021-06-13 DIAGNOSIS — L89214 Pressure ulcer of right hip, stage 4: Secondary | ICD-10-CM | POA: Diagnosis not present

## 2021-06-13 DIAGNOSIS — M86472 Chronic osteomyelitis with draining sinus, left ankle and foot: Secondary | ICD-10-CM | POA: Diagnosis not present

## 2021-06-13 NOTE — Telephone Encounter (Signed)
?  Prescription Request ? ?06/13/2021 ? ?What is the name of the medication or equipment? MIDODRINE, FOLIC ACID, FERROUS SULFATE ? ?Have you contacted your pharmacy to request a refill? YES ? ?Which pharmacy would you like this sent to? [facility] ? ?Pt was prescribed these medications when she was in the hospital and needs Christy to call in refills for her because she is almost out. ? ?  ?

## 2021-06-14 DIAGNOSIS — L89324 Pressure ulcer of left buttock, stage 4: Secondary | ICD-10-CM | POA: Diagnosis not present

## 2021-06-16 ENCOUNTER — Telehealth: Payer: Self-pay | Admitting: Family

## 2021-06-16 DIAGNOSIS — L89224 Pressure ulcer of left hip, stage 4: Secondary | ICD-10-CM | POA: Diagnosis not present

## 2021-06-16 DIAGNOSIS — M86472 Chronic osteomyelitis with draining sinus, left ankle and foot: Secondary | ICD-10-CM | POA: Diagnosis not present

## 2021-06-16 DIAGNOSIS — G8220 Paraplegia, unspecified: Secondary | ICD-10-CM | POA: Diagnosis not present

## 2021-06-16 DIAGNOSIS — D638 Anemia in other chronic diseases classified elsewhere: Secondary | ICD-10-CM | POA: Diagnosis not present

## 2021-06-16 DIAGNOSIS — L89624 Pressure ulcer of left heel, stage 4: Secondary | ICD-10-CM | POA: Diagnosis not present

## 2021-06-16 DIAGNOSIS — A419 Sepsis, unspecified organism: Secondary | ICD-10-CM | POA: Diagnosis not present

## 2021-06-16 DIAGNOSIS — L89214 Pressure ulcer of right hip, stage 4: Secondary | ICD-10-CM | POA: Diagnosis not present

## 2021-06-16 DIAGNOSIS — E43 Unspecified severe protein-calorie malnutrition: Secondary | ICD-10-CM | POA: Diagnosis not present

## 2021-06-16 DIAGNOSIS — R6521 Severe sepsis with septic shock: Secondary | ICD-10-CM | POA: Diagnosis not present

## 2021-06-16 DIAGNOSIS — L89324 Pressure ulcer of left buttock, stage 4: Secondary | ICD-10-CM | POA: Diagnosis not present

## 2021-06-16 MED ORDER — FOLIC ACID 1 MG PO TABS: 1.0000 mg | ORAL_TABLET | Freq: Every day | ORAL | 5 refills | Status: AC

## 2021-06-16 MED ORDER — FERROUS SULFATE 325 (65 FE) MG PO TABS: 325.0000 mg | ORAL_TABLET | Freq: Two times a day (BID) | ORAL | 5 refills | Status: AC

## 2021-06-16 MED ORDER — MIDODRINE HCL 10 MG PO TABS: 10.0000 mg | ORAL_TABLET | Freq: Three times a day (TID) | ORAL | 2 refills | Status: DC

## 2021-06-16 NOTE — Telephone Encounter (Signed)
Last office visit 05/15/21 ?

## 2021-06-16 NOTE — Telephone Encounter (Signed)
Following up about a power chair. Please call back.  ?

## 2021-06-17 NOTE — Telephone Encounter (Signed)
TC to [facility], after checking on providers desk, I let her know that we have not received ppw on pt, she will resend to my direct fax # [phone] ?

## 2021-06-19 DIAGNOSIS — A419 Sepsis, unspecified organism: Secondary | ICD-10-CM | POA: Diagnosis not present

## 2021-06-19 DIAGNOSIS — L89624 Pressure ulcer of left heel, stage 4: Secondary | ICD-10-CM | POA: Diagnosis not present

## 2021-06-19 DIAGNOSIS — Z03818 Encounter for observation for suspected exposure to other biological agents ruled out: Secondary | ICD-10-CM | POA: Diagnosis not present

## 2021-06-19 DIAGNOSIS — D638 Anemia in other chronic diseases classified elsewhere: Secondary | ICD-10-CM | POA: Diagnosis not present

## 2021-06-19 DIAGNOSIS — L89224 Pressure ulcer of left hip, stage 4: Secondary | ICD-10-CM | POA: Diagnosis not present

## 2021-06-19 DIAGNOSIS — M86472 Chronic osteomyelitis with draining sinus, left ankle and foot: Secondary | ICD-10-CM | POA: Diagnosis not present

## 2021-06-19 DIAGNOSIS — G8220 Paraplegia, unspecified: Secondary | ICD-10-CM | POA: Diagnosis not present

## 2021-06-19 DIAGNOSIS — R6521 Severe sepsis with septic shock: Secondary | ICD-10-CM | POA: Diagnosis not present

## 2021-06-19 DIAGNOSIS — L89324 Pressure ulcer of left buttock, stage 4: Secondary | ICD-10-CM | POA: Diagnosis not present

## 2021-06-19 DIAGNOSIS — E43 Unspecified severe protein-calorie malnutrition: Secondary | ICD-10-CM | POA: Diagnosis not present

## 2021-06-19 DIAGNOSIS — L89214 Pressure ulcer of right hip, stage 4: Secondary | ICD-10-CM | POA: Diagnosis not present

## 2021-06-20 DIAGNOSIS — F17210 Nicotine dependence, cigarettes, uncomplicated: Secondary | ICD-10-CM | POA: Diagnosis not present

## 2021-06-20 DIAGNOSIS — L89224 Pressure ulcer of left hip, stage 4: Secondary | ICD-10-CM | POA: Diagnosis not present

## 2021-06-20 DIAGNOSIS — L89624 Pressure ulcer of left heel, stage 4: Secondary | ICD-10-CM | POA: Diagnosis not present

## 2021-06-20 DIAGNOSIS — E43 Unspecified severe protein-calorie malnutrition: Secondary | ICD-10-CM | POA: Diagnosis not present

## 2021-06-20 DIAGNOSIS — G8220 Paraplegia, unspecified: Secondary | ICD-10-CM | POA: Diagnosis not present

## 2021-06-20 DIAGNOSIS — L89214 Pressure ulcer of right hip, stage 4: Secondary | ICD-10-CM | POA: Diagnosis not present

## 2021-06-20 DIAGNOSIS — D638 Anemia in other chronic diseases classified elsewhere: Secondary | ICD-10-CM | POA: Diagnosis not present

## 2021-06-20 DIAGNOSIS — I959 Hypotension, unspecified: Secondary | ICD-10-CM | POA: Diagnosis not present

## 2021-06-20 DIAGNOSIS — L89324 Pressure ulcer of left buttock, stage 4: Secondary | ICD-10-CM | POA: Diagnosis not present

## 2021-06-23 DIAGNOSIS — L89624 Pressure ulcer of left heel, stage 4: Secondary | ICD-10-CM | POA: Diagnosis not present

## 2021-06-23 DIAGNOSIS — I959 Hypotension, unspecified: Secondary | ICD-10-CM | POA: Diagnosis not present

## 2021-06-23 DIAGNOSIS — L89324 Pressure ulcer of left buttock, stage 4: Secondary | ICD-10-CM | POA: Diagnosis not present

## 2021-06-23 DIAGNOSIS — L89214 Pressure ulcer of right hip, stage 4: Secondary | ICD-10-CM | POA: Diagnosis not present

## 2021-06-23 DIAGNOSIS — G8220 Paraplegia, unspecified: Secondary | ICD-10-CM | POA: Diagnosis not present

## 2021-06-23 DIAGNOSIS — F17210 Nicotine dependence, cigarettes, uncomplicated: Secondary | ICD-10-CM | POA: Diagnosis not present

## 2021-06-23 DIAGNOSIS — E43 Unspecified severe protein-calorie malnutrition: Secondary | ICD-10-CM | POA: Diagnosis not present

## 2021-06-23 DIAGNOSIS — D638 Anemia in other chronic diseases classified elsewhere: Secondary | ICD-10-CM | POA: Diagnosis not present

## 2021-06-23 DIAGNOSIS — L89224 Pressure ulcer of left hip, stage 4: Secondary | ICD-10-CM | POA: Diagnosis not present

## 2021-06-24 ENCOUNTER — Ambulatory Visit
Admission: RE | Admit: 2021-06-24 | Discharge: 2021-06-24 | Disposition: A | Discharge status: Home / Self Care | Payer: Medicare Other | Source: Ambulatory Visit | Attending: Urology | Admitting: Urology

## 2021-06-24 DIAGNOSIS — N319 Neuromuscular dysfunction of bladder, unspecified: Secondary | ICD-10-CM

## 2021-06-24 DIAGNOSIS — G8220 Paraplegia, unspecified: Secondary | ICD-10-CM | POA: Diagnosis not present

## 2021-06-25 DIAGNOSIS — L89624 Pressure ulcer of left heel, stage 4: Secondary | ICD-10-CM | POA: Diagnosis not present

## 2021-06-25 DIAGNOSIS — L8944 Pressure ulcer of contiguous site of back, buttock and hip, stage 4: Secondary | ICD-10-CM | POA: Diagnosis not present

## 2021-06-26 DIAGNOSIS — E43 Unspecified severe protein-calorie malnutrition: Secondary | ICD-10-CM | POA: Diagnosis not present

## 2021-06-26 DIAGNOSIS — L89624 Pressure ulcer of left heel, stage 4: Secondary | ICD-10-CM | POA: Diagnosis not present

## 2021-06-26 DIAGNOSIS — F17210 Nicotine dependence, cigarettes, uncomplicated: Secondary | ICD-10-CM | POA: Diagnosis not present

## 2021-06-26 DIAGNOSIS — G8220 Paraplegia, unspecified: Secondary | ICD-10-CM | POA: Diagnosis not present

## 2021-06-26 DIAGNOSIS — I959 Hypotension, unspecified: Secondary | ICD-10-CM | POA: Diagnosis not present

## 2021-06-26 DIAGNOSIS — D638 Anemia in other chronic diseases classified elsewhere: Secondary | ICD-10-CM | POA: Diagnosis not present

## 2021-06-26 DIAGNOSIS — L89224 Pressure ulcer of left hip, stage 4: Secondary | ICD-10-CM | POA: Diagnosis not present

## 2021-06-26 DIAGNOSIS — L89214 Pressure ulcer of right hip, stage 4: Secondary | ICD-10-CM | POA: Diagnosis not present

## 2021-06-26 DIAGNOSIS — L89324 Pressure ulcer of left buttock, stage 4: Secondary | ICD-10-CM | POA: Diagnosis not present

## 2021-06-30 DIAGNOSIS — D638 Anemia in other chronic diseases classified elsewhere: Secondary | ICD-10-CM | POA: Diagnosis not present

## 2021-06-30 DIAGNOSIS — R339 Retention of urine, unspecified: Secondary | ICD-10-CM | POA: Diagnosis not present

## 2021-06-30 DIAGNOSIS — E43 Unspecified severe protein-calorie malnutrition: Secondary | ICD-10-CM | POA: Diagnosis not present

## 2021-06-30 DIAGNOSIS — F17210 Nicotine dependence, cigarettes, uncomplicated: Secondary | ICD-10-CM | POA: Diagnosis not present

## 2021-06-30 DIAGNOSIS — G8220 Paraplegia, unspecified: Secondary | ICD-10-CM | POA: Diagnosis not present

## 2021-06-30 DIAGNOSIS — L89214 Pressure ulcer of right hip, stage 4: Secondary | ICD-10-CM | POA: Diagnosis not present

## 2021-06-30 DIAGNOSIS — L89224 Pressure ulcer of left hip, stage 4: Secondary | ICD-10-CM | POA: Diagnosis not present

## 2021-06-30 DIAGNOSIS — L89624 Pressure ulcer of left heel, stage 4: Secondary | ICD-10-CM | POA: Diagnosis not present

## 2021-06-30 DIAGNOSIS — L89324 Pressure ulcer of left buttock, stage 4: Secondary | ICD-10-CM | POA: Diagnosis not present

## 2021-06-30 DIAGNOSIS — I959 Hypotension, unspecified: Secondary | ICD-10-CM | POA: Diagnosis not present

## 2021-07-02 DIAGNOSIS — G8220 Paraplegia, unspecified: Secondary | ICD-10-CM | POA: Diagnosis not present

## 2021-07-02 DIAGNOSIS — L89224 Pressure ulcer of left hip, stage 4: Secondary | ICD-10-CM | POA: Diagnosis not present

## 2021-07-02 DIAGNOSIS — F17210 Nicotine dependence, cigarettes, uncomplicated: Secondary | ICD-10-CM | POA: Diagnosis not present

## 2021-07-02 DIAGNOSIS — E43 Unspecified severe protein-calorie malnutrition: Secondary | ICD-10-CM | POA: Diagnosis not present

## 2021-07-02 DIAGNOSIS — L89624 Pressure ulcer of left heel, stage 4: Secondary | ICD-10-CM | POA: Diagnosis not present

## 2021-07-02 DIAGNOSIS — L89324 Pressure ulcer of left buttock, stage 4: Secondary | ICD-10-CM | POA: Diagnosis not present

## 2021-07-02 DIAGNOSIS — I959 Hypotension, unspecified: Secondary | ICD-10-CM | POA: Diagnosis not present

## 2021-07-02 DIAGNOSIS — L89214 Pressure ulcer of right hip, stage 4: Secondary | ICD-10-CM | POA: Diagnosis not present

## 2021-07-02 DIAGNOSIS — D638 Anemia in other chronic diseases classified elsewhere: Secondary | ICD-10-CM | POA: Diagnosis not present

## 2021-07-03 DIAGNOSIS — L89214 Pressure ulcer of right hip, stage 4: Secondary | ICD-10-CM | POA: Diagnosis not present

## 2021-07-03 DIAGNOSIS — F17210 Nicotine dependence, cigarettes, uncomplicated: Secondary | ICD-10-CM | POA: Diagnosis not present

## 2021-07-03 DIAGNOSIS — L89324 Pressure ulcer of left buttock, stage 4: Secondary | ICD-10-CM | POA: Diagnosis not present

## 2021-07-03 DIAGNOSIS — L89224 Pressure ulcer of left hip, stage 4: Secondary | ICD-10-CM | POA: Diagnosis not present

## 2021-07-03 DIAGNOSIS — L89624 Pressure ulcer of left heel, stage 4: Secondary | ICD-10-CM | POA: Diagnosis not present

## 2021-07-03 DIAGNOSIS — I959 Hypotension, unspecified: Secondary | ICD-10-CM | POA: Diagnosis not present

## 2021-07-03 DIAGNOSIS — D638 Anemia in other chronic diseases classified elsewhere: Secondary | ICD-10-CM | POA: Diagnosis not present

## 2021-07-03 DIAGNOSIS — E43 Unspecified severe protein-calorie malnutrition: Secondary | ICD-10-CM | POA: Diagnosis not present

## 2021-07-03 DIAGNOSIS — G8220 Paraplegia, unspecified: Secondary | ICD-10-CM | POA: Diagnosis not present

## 2021-07-07 DIAGNOSIS — I959 Hypotension, unspecified: Secondary | ICD-10-CM | POA: Diagnosis not present

## 2021-07-07 DIAGNOSIS — L89224 Pressure ulcer of left hip, stage 4: Secondary | ICD-10-CM | POA: Diagnosis not present

## 2021-07-07 DIAGNOSIS — L89214 Pressure ulcer of right hip, stage 4: Secondary | ICD-10-CM | POA: Diagnosis not present

## 2021-07-07 DIAGNOSIS — F17210 Nicotine dependence, cigarettes, uncomplicated: Secondary | ICD-10-CM | POA: Diagnosis not present

## 2021-07-07 DIAGNOSIS — G8220 Paraplegia, unspecified: Secondary | ICD-10-CM | POA: Diagnosis not present

## 2021-07-07 DIAGNOSIS — L89324 Pressure ulcer of left buttock, stage 4: Secondary | ICD-10-CM | POA: Diagnosis not present

## 2021-07-07 DIAGNOSIS — L89624 Pressure ulcer of left heel, stage 4: Secondary | ICD-10-CM | POA: Diagnosis not present

## 2021-07-07 DIAGNOSIS — D638 Anemia in other chronic diseases classified elsewhere: Secondary | ICD-10-CM | POA: Diagnosis not present

## 2021-07-07 DIAGNOSIS — E43 Unspecified severe protein-calorie malnutrition: Secondary | ICD-10-CM | POA: Diagnosis not present

## 2021-07-09 DIAGNOSIS — S343XXD Injury of cauda equina, subsequent encounter: Secondary | ICD-10-CM | POA: Diagnosis not present

## 2021-07-09 DIAGNOSIS — L89219 Pressure ulcer of right hip, unspecified stage: Secondary | ICD-10-CM | POA: Diagnosis not present

## 2021-07-09 DIAGNOSIS — Z79899 Other long term (current) drug therapy: Secondary | ICD-10-CM | POA: Diagnosis not present

## 2021-07-09 DIAGNOSIS — L89622 Pressure ulcer of left heel, stage 2: Secondary | ICD-10-CM | POA: Diagnosis not present

## 2021-07-09 DIAGNOSIS — B955 Unspecified streptococcus as the cause of diseases classified elsewhere: Secondary | ICD-10-CM | POA: Diagnosis not present

## 2021-07-09 DIAGNOSIS — L89159 Pressure ulcer of sacral region, unspecified stage: Secondary | ICD-10-CM | POA: Diagnosis not present

## 2021-07-09 DIAGNOSIS — Z20822 Contact with and (suspected) exposure to covid-19: Secondary | ICD-10-CM | POA: Diagnosis not present

## 2021-07-09 DIAGNOSIS — Z792 Long term (current) use of antibiotics: Secondary | ICD-10-CM | POA: Diagnosis not present

## 2021-07-09 DIAGNOSIS — J449 Chronic obstructive pulmonary disease, unspecified: Secondary | ICD-10-CM | POA: Diagnosis not present

## 2021-07-09 DIAGNOSIS — Q6589 Other specified congenital deformities of hip: Secondary | ICD-10-CM | POA: Diagnosis not present

## 2021-07-09 DIAGNOSIS — L89629 Pressure ulcer of left heel, unspecified stage: Secondary | ICD-10-CM | POA: Diagnosis not present

## 2021-07-09 DIAGNOSIS — F17200 Nicotine dependence, unspecified, uncomplicated: Secondary | ICD-10-CM | POA: Diagnosis not present

## 2021-07-09 DIAGNOSIS — M86151 Other acute osteomyelitis, right femur: Secondary | ICD-10-CM | POA: Diagnosis not present

## 2021-07-09 DIAGNOSIS — T797XXA Traumatic subcutaneous emphysema, initial encounter: Secondary | ICD-10-CM | POA: Diagnosis not present

## 2021-07-09 DIAGNOSIS — S71001A Unspecified open wound, right hip, initial encounter: Secondary | ICD-10-CM | POA: Diagnosis not present

## 2021-07-09 DIAGNOSIS — L89223 Pressure ulcer of left hip, stage 3: Secondary | ICD-10-CM | POA: Diagnosis not present

## 2021-07-09 DIAGNOSIS — F17210 Nicotine dependence, cigarettes, uncomplicated: Secondary | ICD-10-CM | POA: Diagnosis not present

## 2021-07-09 DIAGNOSIS — M869 Osteomyelitis, unspecified: Secondary | ICD-10-CM | POA: Insufficient documentation

## 2021-07-09 DIAGNOSIS — N319 Neuromuscular dysfunction of bladder, unspecified: Secondary | ICD-10-CM | POA: Diagnosis not present

## 2021-07-09 DIAGNOSIS — G8929 Other chronic pain: Secondary | ICD-10-CM | POA: Diagnosis not present

## 2021-07-09 DIAGNOSIS — Z96641 Presence of right artificial hip joint: Secondary | ICD-10-CM | POA: Diagnosis not present

## 2021-07-09 DIAGNOSIS — G8220 Paraplegia, unspecified: Secondary | ICD-10-CM | POA: Diagnosis not present

## 2021-07-09 DIAGNOSIS — L89324 Pressure ulcer of left buttock, stage 4: Secondary | ICD-10-CM | POA: Diagnosis not present

## 2021-07-09 DIAGNOSIS — M4716 Other spondylosis with myelopathy, lumbar region: Secondary | ICD-10-CM | POA: Diagnosis not present

## 2021-07-09 DIAGNOSIS — L89619 Pressure ulcer of right heel, unspecified stage: Secondary | ICD-10-CM | POA: Diagnosis not present

## 2021-07-09 DIAGNOSIS — S343XXA Injury of cauda equina, initial encounter: Secondary | ICD-10-CM | POA: Diagnosis not present

## 2021-07-09 DIAGNOSIS — M86159 Other acute osteomyelitis, unspecified femur: Secondary | ICD-10-CM | POA: Diagnosis not present

## 2021-07-09 DIAGNOSIS — L89326 Pressure-induced deep tissue damage of left buttock: Secondary | ICD-10-CM | POA: Diagnosis not present

## 2021-07-09 DIAGNOSIS — L89214 Pressure ulcer of right hip, stage 4: Secondary | ICD-10-CM | POA: Diagnosis not present

## 2021-07-09 DIAGNOSIS — S39012D Strain of muscle, fascia and tendon of lower back, subsequent encounter: Secondary | ICD-10-CM | POA: Diagnosis not present

## 2021-07-09 DIAGNOSIS — B9689 Other specified bacterial agents as the cause of diseases classified elsewhere: Secondary | ICD-10-CM | POA: Diagnosis not present

## 2021-07-09 DIAGNOSIS — L89609 Pressure ulcer of unspecified heel, unspecified stage: Secondary | ICD-10-CM | POA: Diagnosis not present

## 2021-07-10 ENCOUNTER — Ambulatory Visit: Payer: Medicare Other | Admitting: Family

## 2021-07-14 DIAGNOSIS — M86159 Other acute osteomyelitis, unspecified femur: Secondary | ICD-10-CM | POA: Diagnosis not present

## 2021-07-16 ENCOUNTER — Telehealth: Payer: Self-pay | Admitting: Family

## 2021-07-16 DIAGNOSIS — D638 Anemia in other chronic diseases classified elsewhere: Secondary | ICD-10-CM | POA: Diagnosis not present

## 2021-07-16 DIAGNOSIS — F17210 Nicotine dependence, cigarettes, uncomplicated: Secondary | ICD-10-CM | POA: Diagnosis not present

## 2021-07-16 DIAGNOSIS — M869 Osteomyelitis, unspecified: Secondary | ICD-10-CM | POA: Diagnosis not present

## 2021-07-16 DIAGNOSIS — M1612 Unilateral primary osteoarthritis, left hip: Secondary | ICD-10-CM | POA: Diagnosis not present

## 2021-07-16 DIAGNOSIS — L89324 Pressure ulcer of left buttock, stage 4: Secondary | ICD-10-CM | POA: Diagnosis not present

## 2021-07-16 DIAGNOSIS — T8450XA Infection and inflammatory reaction due to unspecified internal joint prosthesis, initial encounter: Secondary | ICD-10-CM | POA: Diagnosis not present

## 2021-07-16 DIAGNOSIS — L89224 Pressure ulcer of left hip, stage 4: Secondary | ICD-10-CM | POA: Diagnosis not present

## 2021-07-16 DIAGNOSIS — I959 Hypotension, unspecified: Secondary | ICD-10-CM | POA: Diagnosis not present

## 2021-07-16 DIAGNOSIS — T8451XA Infection and inflammatory reaction due to internal right hip prosthesis, initial encounter: Secondary | ICD-10-CM | POA: Diagnosis not present

## 2021-07-16 DIAGNOSIS — L89624 Pressure ulcer of left heel, stage 4: Secondary | ICD-10-CM | POA: Diagnosis not present

## 2021-07-16 DIAGNOSIS — G8220 Paraplegia, unspecified: Secondary | ICD-10-CM | POA: Diagnosis not present

## 2021-07-16 DIAGNOSIS — Z96641 Presence of right artificial hip joint: Secondary | ICD-10-CM | POA: Diagnosis not present

## 2021-07-16 DIAGNOSIS — L89214 Pressure ulcer of right hip, stage 4: Secondary | ICD-10-CM | POA: Diagnosis not present

## 2021-07-16 DIAGNOSIS — Z471 Aftercare following joint replacement surgery: Secondary | ICD-10-CM | POA: Diagnosis not present

## 2021-07-16 DIAGNOSIS — Q6589 Other specified congenital deformities of hip: Secondary | ICD-10-CM | POA: Diagnosis not present

## 2021-07-16 DIAGNOSIS — Z96649 Presence of unspecified artificial hip joint: Secondary | ICD-10-CM | POA: Diagnosis not present

## 2021-07-16 DIAGNOSIS — M8588 Other specified disorders of bone density and structure, other site: Secondary | ICD-10-CM | POA: Diagnosis not present

## 2021-07-16 DIAGNOSIS — E43 Unspecified severe protein-calorie malnutrition: Secondary | ICD-10-CM | POA: Diagnosis not present

## 2021-07-16 NOTE — Telephone Encounter (Signed)
Appt made

## 2021-07-16 NOTE — Telephone Encounter (Signed)
Please call patient to schedule hospital follow up appt. ?

## 2021-07-17 DIAGNOSIS — F17210 Nicotine dependence, cigarettes, uncomplicated: Secondary | ICD-10-CM | POA: Diagnosis not present

## 2021-07-17 DIAGNOSIS — I959 Hypotension, unspecified: Secondary | ICD-10-CM | POA: Diagnosis not present

## 2021-07-17 DIAGNOSIS — Z03818 Encounter for observation for suspected exposure to other biological agents ruled out: Secondary | ICD-10-CM | POA: Diagnosis not present

## 2021-07-17 DIAGNOSIS — L89324 Pressure ulcer of left buttock, stage 4: Secondary | ICD-10-CM | POA: Diagnosis not present

## 2021-07-17 DIAGNOSIS — D638 Anemia in other chronic diseases classified elsewhere: Secondary | ICD-10-CM | POA: Diagnosis not present

## 2021-07-17 DIAGNOSIS — E43 Unspecified severe protein-calorie malnutrition: Secondary | ICD-10-CM | POA: Diagnosis not present

## 2021-07-17 DIAGNOSIS — L89214 Pressure ulcer of right hip, stage 4: Secondary | ICD-10-CM | POA: Diagnosis not present

## 2021-07-17 DIAGNOSIS — G8220 Paraplegia, unspecified: Secondary | ICD-10-CM | POA: Diagnosis not present

## 2021-07-17 DIAGNOSIS — L89624 Pressure ulcer of left heel, stage 4: Secondary | ICD-10-CM | POA: Diagnosis not present

## 2021-07-17 DIAGNOSIS — L89224 Pressure ulcer of left hip, stage 4: Secondary | ICD-10-CM | POA: Diagnosis not present

## 2021-07-21 DIAGNOSIS — L89224 Pressure ulcer of left hip, stage 4: Secondary | ICD-10-CM | POA: Diagnosis not present

## 2021-07-21 DIAGNOSIS — I959 Hypotension, unspecified: Secondary | ICD-10-CM | POA: Diagnosis not present

## 2021-07-21 DIAGNOSIS — G8220 Paraplegia, unspecified: Secondary | ICD-10-CM | POA: Diagnosis not present

## 2021-07-21 DIAGNOSIS — L89624 Pressure ulcer of left heel, stage 4: Secondary | ICD-10-CM | POA: Diagnosis not present

## 2021-07-21 DIAGNOSIS — L89324 Pressure ulcer of left buttock, stage 4: Secondary | ICD-10-CM | POA: Diagnosis not present

## 2021-07-21 DIAGNOSIS — R339 Retention of urine, unspecified: Secondary | ICD-10-CM | POA: Diagnosis not present

## 2021-07-21 DIAGNOSIS — E43 Unspecified severe protein-calorie malnutrition: Secondary | ICD-10-CM | POA: Diagnosis not present

## 2021-07-21 DIAGNOSIS — D638 Anemia in other chronic diseases classified elsewhere: Secondary | ICD-10-CM | POA: Diagnosis not present

## 2021-07-21 DIAGNOSIS — F17210 Nicotine dependence, cigarettes, uncomplicated: Secondary | ICD-10-CM | POA: Diagnosis not present

## 2021-07-21 DIAGNOSIS — L89214 Pressure ulcer of right hip, stage 4: Secondary | ICD-10-CM | POA: Diagnosis not present

## 2021-07-22 ENCOUNTER — Ambulatory Visit (INDEPENDENT_AMBULATORY_CARE_PROVIDER_SITE_OTHER): Payer: Medicare Other | Admitting: Family Medicine

## 2021-07-22 ENCOUNTER — Encounter: Payer: Self-pay | Admitting: Family Medicine

## 2021-07-22 VITALS — BP 103/62 | HR 100 | Temp 97.9°F

## 2021-07-22 DIAGNOSIS — T07XXXA Unspecified multiple injuries, initial encounter: Secondary | ICD-10-CM | POA: Diagnosis not present

## 2021-07-22 DIAGNOSIS — M86151 Other acute osteomyelitis, right femur: Secondary | ICD-10-CM | POA: Diagnosis not present

## 2021-07-22 DIAGNOSIS — Z09 Encounter for follow-up examination after completed treatment for conditions other than malignant neoplasm: Secondary | ICD-10-CM

## 2021-07-22 NOTE — Patient Instructions (Signed)
[facility] ?Phone: [phone] ?You saw Dr. Scharlene Gloss ?

## 2021-07-22 NOTE — Progress Notes (Signed)
? ?Assessment & Plan:  ?1. Acute osteomyelitis of right femur ([place]) ?Lab work completed with the orthopedic last week.  Patient to continue following with ortho.  Advised to schedule appointment with infectious disease.  Phone number provided for infectious disease in [place].  Advised if she does not want to return to them she needs to get the referral to [facility]. ? ?2. Hospital discharge follow-up ? ?3. Multiple wounds ?Patient is going to look into SNF placement for inpatient rehab.  Advised once she finds somewhere, she will need an FL 2 completed with [place]. ? ? ?Follow up plan: Return for chronic follow-up with PCP. ? ?Hendricks Limes, MSN, APRN, FNP-C ?[place] ? ?Subjective:  ? ?Patient ID: Kim [name], female    DOB: 22-Nov-1975, 46 y.o.   MRN: [id number] ? ?HPI: ?Kim [name] is a 46 y.o. female presenting on 07/22/2021 for Hospitalization Follow-up (07/09/21- acute osteomyelitis of rt femur ) ? ?Patient is here for a hospital follow-up.  She was admitted to [facility] for 07/09/2021-07/14/2021 due to acute osteomyelitis of the right femur.  Patient was referred to [facility] for care.  She was discharged home on IV vancomycin and Invanz via her PICC line.  Orthopedics and infectious disease at [facility] to determine further treatment. ? ?She did see the orthopedic on 07/16/2021; the plan is to remove her right hip prosthetic on 10/20/2021 after optimizing her nutrition.   ? ?She does not have a follow-up with infectious disease scheduled at this time.  She was advised to schedule this by the orthopedic with directions to let them know if she was unable so that they could place a referral to [facility] infectious disease.  Patient reports that she has not yet made this appointment as she is not sure if she wants to return to infectious disease in [place] or obtain a referral to [facility]. ? ?Patient is questioning inpatient rehab as she is having a  hard time at home with all of her dressing changes and the wound VAC.  She does have home health, but they only come out twice a week.  She is feeling like a burden on her family. ? ? ?ROS: Negative unless specifically indicated above in HPI.  ? ?Relevant past medical history reviewed and updated as indicated.  ? ?Allergies and medications reviewed and updated. ? ? ?Current Outpatient Medications:  ?  acetaminophen (TYLENOL) 650 MG suppository, Place 650 mg rectally every 4 (four) hours as needed., Disp: , Rfl:  ?  Ascorbic Acid (VITAMIN C) 500 MG CAPS, Take 500 mg by mouth daily., Disp: 90 capsule, Rfl: 11 ?  Buprenorphine HCl-Naloxone HCl 8-2 MG FILM, Place 1 Film under the tongue 3 (three) times daily., Disp: , Rfl:  ?  CALCIUM-VITAMIN D PO, Take 1 tablet by mouth daily., Disp: , Rfl:  ?  clonazePAM (KLONOPIN) 0.5 MG tablet, Take 0.5 mg by mouth 2 (two) times daily., Disp: , Rfl:  ?  collagenase (SANTYL) ointment, Apply topically daily., Disp: 30 g, Rfl: 0 ?  Elastic Bandages & Supports (HEEL/ANKLE PROTECTOR) MISC, 1 each by Does not apply route at bedtime., Disp: 2 each, Rfl: 1 ?  ertapenem (INVANZ) 1 g injection, Inject into the vein., Disp: , Rfl:  ?  ferrous sulfate 325 (65 FE) MG tablet, Take 1 tablet (325 mg total) by mouth 2 (two) times daily with a meal., Disp: 60 tablet, Rfl: 5 ?  folic acid (FOLVITE) 1 MG tablet, Take  1 tablet (1 mg total) by mouth daily., Disp: 30 tablet, Rfl: 5 ?  Foot Care Products (SOCK AID) MISC, 1 Device by Does not apply route daily., Disp: 1 each, Rfl: 0 ?  gabapentin (NEURONTIN) 300 MG capsule, Take 600 mg by mouth 2 (two) times daily., Disp: , Rfl:  ?  midodrine (PROAMATINE) 10 MG tablet, Take 1 tablet (10 mg total) by mouth 3 (three) times daily with meals., Disp: 90 tablet, Rfl: 2 ?  Misc. Devices (BED WEDGE) MISC, 1 each by Does not apply route 2 (two) times daily., Disp: 2 each, Rfl: 0 ?  Misc. Devices (TRANSFER BENCH) MISC, 1 Device by Does not apply route as needed.,  Disp: 1 each, Rfl: 1 ?  Misc. Devices [facility] CUSHION) MISC, 1 application by Does not apply route daily., Disp: 1 each, Rfl: 1 ?  Multiple Vitamins-Calcium (ONE-A-DAY WOMENS PO), Take 2 tablets by mouth daily., Disp: , Rfl:  ?  NARCAN 4 MG/0.1ML LIQD nasal spray kit, , Disp: , Rfl:  ?  Nutritional Supplements (ENSURE HIGH PROTEIN) LIQD, Take 237 mLs by mouth 4 (four) times daily - after meals and at bedtime., Disp: 3792 mL, Rfl: 11 ?  Nutritional Supplements (JUVEN NUTRIVIGOR) PACK, Take 1 each by mouth 3 (three) times daily., Disp: 180 each, Rfl: 3 ?  Nutritional Supplements (PROMOD) LIQD, Take 30 mLs by mouth 2 (two) times daily., Disp: 946 mL, Rfl: 11 ?  polyethylene glycol powder (GLYCOLAX/MIRALAX) 17 GM/SCOOP powder, Take 17 g by mouth daily., Disp: 3350 g, Rfl: 1 ?  POTASSIUM PO, Take 50 mg by mouth daily., Disp: , Rfl:  ?  silver sulfADIAZINE (SILVADENE) 1 % cream, Apply 1 application topically daily., Disp: 50 g, Rfl: 0 ?  vancomycin (VANCOCIN) 10 G SOLR injection, Inject into the vein., Disp: , Rfl:  ?  Zinc 50 MG CAPS, Take 1 capsule (50 mg total) by mouth daily., Disp: 90 capsule, Rfl: 1 ? ?Allergies  ?Allergen Reactions  ? Keflex [Cephalexin] Diarrhea  ? ? ?Objective:  ? ?BP 103/62   Pulse 100   Temp 97.9 ?F (36.6 ?C) (Temporal)   LMP 11/24/2012   SpO2 91%   ? ?Physical Exam ?Vitals reviewed.  ?Constitutional:   ?   General: She is not in acute distress. ?   Appearance: Normal appearance. She is not ill-appearing, toxic-appearing or diaphoretic.  ?HENT:  ?   Head: Normocephalic and atraumatic.  ?Eyes:  ?   General: No scleral icterus.    ?   Right eye: No discharge.     ?   Left eye: No discharge.  ?   Conjunctiva/sclera: Conjunctivae normal.  ?Cardiovascular:  ?   Rate and Rhythm: Tachycardia present.  ?Pulmonary:  ?   Effort: Pulmonary effort is normal. No respiratory distress.  ?Musculoskeletal:     ?   General: Normal range of motion.  ?   Cervical back: Normal range of motion.  ?Skin: ?    General: Skin is warm and dry.  ?   Capillary Refill: Capillary refill takes less than 2 seconds.  ?Neurological:  ?   General: No focal deficit present.  ?   Mental Status: She is alert and oriented to person, place, and time. Mental status is at baseline.  ?   Gait: Gait abnormal (riding in [facility]).  ?Psychiatric:     ?   Mood and Affect: Mood normal.     ?   Behavior: Behavior normal.     ?  Thought Content: Thought content normal.     ?   Judgment: Judgment normal.  ? ? ? ? ? ? ?

## 2021-07-23 DIAGNOSIS — L89224 Pressure ulcer of left hip, stage 4: Secondary | ICD-10-CM | POA: Diagnosis not present

## 2021-07-23 DIAGNOSIS — L89214 Pressure ulcer of right hip, stage 4: Secondary | ICD-10-CM | POA: Diagnosis not present

## 2021-07-24 ENCOUNTER — Telehealth: Payer: Self-pay | Admitting: Family

## 2021-07-24 DIAGNOSIS — L89624 Pressure ulcer of left heel, stage 4: Secondary | ICD-10-CM | POA: Diagnosis not present

## 2021-07-24 DIAGNOSIS — L89324 Pressure ulcer of left buttock, stage 4: Secondary | ICD-10-CM | POA: Diagnosis not present

## 2021-07-24 DIAGNOSIS — M869 Osteomyelitis, unspecified: Secondary | ICD-10-CM | POA: Diagnosis not present

## 2021-07-24 DIAGNOSIS — D638 Anemia in other chronic diseases classified elsewhere: Secondary | ICD-10-CM | POA: Diagnosis not present

## 2021-07-24 DIAGNOSIS — I959 Hypotension, unspecified: Secondary | ICD-10-CM | POA: Diagnosis not present

## 2021-07-24 DIAGNOSIS — M86159 Other acute osteomyelitis, unspecified femur: Secondary | ICD-10-CM | POA: Diagnosis not present

## 2021-07-24 DIAGNOSIS — E43 Unspecified severe protein-calorie malnutrition: Secondary | ICD-10-CM | POA: Diagnosis not present

## 2021-07-24 DIAGNOSIS — L89224 Pressure ulcer of left hip, stage 4: Secondary | ICD-10-CM | POA: Diagnosis not present

## 2021-07-24 DIAGNOSIS — L89214 Pressure ulcer of right hip, stage 4: Secondary | ICD-10-CM | POA: Diagnosis not present

## 2021-07-24 DIAGNOSIS — G8220 Paraplegia, unspecified: Secondary | ICD-10-CM | POA: Diagnosis not present

## 2021-07-24 DIAGNOSIS — F17210 Nicotine dependence, cigarettes, uncomplicated: Secondary | ICD-10-CM | POA: Diagnosis not present

## 2021-07-24 NOTE — Telephone Encounter (Signed)
Christy with home health aware states pt was getting person stuff in order and was going to go. Alyse Low was going to call and also tell her what we said. ?

## 2021-07-24 NOTE — Telephone Encounter (Signed)
Given VS and pain would recommend going to ED to be evaluated and rule out sepsis.  ?

## 2021-07-25 ENCOUNTER — Telehealth (INDEPENDENT_AMBULATORY_CARE_PROVIDER_SITE_OTHER): Payer: Medicare Other | Admitting: Family

## 2021-07-25 ENCOUNTER — Emergency Department (HOSPITAL_COMMUNITY)
Admission: EM | Admit: 2021-07-25 | Discharge: 2021-07-26 | Disposition: A | Discharge status: Other Acute Inpatient Hospital | Payer: Medicare Other | Attending: Emergency Medicine | Admitting: Emergency Medicine

## 2021-07-25 ENCOUNTER — Encounter: Payer: Self-pay | Admitting: Family

## 2021-07-25 ENCOUNTER — Emergency Department (HOSPITAL_COMMUNITY): Payer: Medicare Other

## 2021-07-25 ENCOUNTER — Other Ambulatory Visit: Payer: Self-pay

## 2021-07-25 ENCOUNTER — Ambulatory Visit (INDEPENDENT_AMBULATORY_CARE_PROVIDER_SITE_OTHER): Payer: Medicare Other

## 2021-07-25 DIAGNOSIS — F419 Anxiety disorder, unspecified: Secondary | ICD-10-CM

## 2021-07-25 DIAGNOSIS — S343XXS Injury of cauda equina, sequela: Secondary | ICD-10-CM | POA: Diagnosis not present

## 2021-07-25 DIAGNOSIS — L89324 Pressure ulcer of left buttock, stage 4: Secondary | ICD-10-CM

## 2021-07-25 DIAGNOSIS — R600 Localized edema: Secondary | ICD-10-CM | POA: Diagnosis not present

## 2021-07-25 DIAGNOSIS — M86451 Chronic osteomyelitis with draining sinus, right femur: Secondary | ICD-10-CM | POA: Insufficient documentation

## 2021-07-25 DIAGNOSIS — F1290 Cannabis use, unspecified, uncomplicated: Secondary | ICD-10-CM

## 2021-07-25 DIAGNOSIS — G629 Polyneuropathy, unspecified: Secondary | ICD-10-CM | POA: Diagnosis not present

## 2021-07-25 DIAGNOSIS — I959 Hypotension, unspecified: Secondary | ICD-10-CM | POA: Diagnosis not present

## 2021-07-25 DIAGNOSIS — G8220 Paraplegia, unspecified: Secondary | ICD-10-CM | POA: Diagnosis not present

## 2021-07-25 DIAGNOSIS — D638 Anemia in other chronic diseases classified elsewhere: Secondary | ICD-10-CM

## 2021-07-25 DIAGNOSIS — L89624 Pressure ulcer of left heel, stage 4: Secondary | ICD-10-CM | POA: Diagnosis not present

## 2021-07-25 DIAGNOSIS — L89223 Pressure ulcer of left hip, stage 3: Secondary | ICD-10-CM

## 2021-07-25 DIAGNOSIS — E43 Unspecified severe protein-calorie malnutrition: Secondary | ICD-10-CM

## 2021-07-25 DIAGNOSIS — L89214 Pressure ulcer of right hip, stage 4: Secondary | ICD-10-CM

## 2021-07-25 DIAGNOSIS — L89224 Pressure ulcer of left hip, stage 4: Secondary | ICD-10-CM

## 2021-07-25 DIAGNOSIS — F17210 Nicotine dependence, cigarettes, uncomplicated: Secondary | ICD-10-CM

## 2021-07-25 LAB — COMPREHENSIVE METABOLIC PANEL
ALT: 11 U/L (ref 0–44)
AST: 14 U/L — ABNORMAL LOW (ref 15–41)
Albumin: 2.5 g/dL — ABNORMAL LOW (ref 3.5–5.0)
Alkaline Phosphatase: 104 U/L (ref 38–126)
Anion gap: 5 (ref 5–15)
BUN: 8 mg/dL (ref 6–20)
CO2: 30 mmol/L (ref 22–32)
Calcium: 8.5 mg/dL — ABNORMAL LOW (ref 8.9–10.3)
Chloride: 101 mmol/L (ref 98–111)
Creatinine, Ser: 0.4 mg/dL — ABNORMAL LOW (ref 0.44–1.00)
GFR, Estimated: >60 mL/min (ref >60)
Glucose, Bld: 87 mg/dL (ref 70–99)
Potassium: 3.9 mmol/L (ref 3.5–5.1)
Sodium: 136 mmol/L (ref 135–145)
Total Bilirubin: 0.5 mg/dL (ref 0.3–1.2)
Total Protein: 8 g/dL (ref 6.5–8.1)

## 2021-07-25 LAB — CBC WITH DIFFERENTIAL/PLATELET
Abs Immature Granulocytes: 0.02 10*3/uL (ref 0.00–0.07)
Basophils Absolute: 0 10*3/uL (ref 0.0–0.1)
Basophils Relative: 1 %
Eosinophils Absolute: 0 10*3/uL (ref 0.0–0.5)
Eosinophils Relative: 0 %
HCT: 31.9 % — ABNORMAL LOW (ref 36.0–46.0)
Hemoglobin: 9.8 g/dL — ABNORMAL LOW (ref 12.0–15.0)
Immature Granulocytes: 0 %
Lymphocytes Relative: 28 %
Lymphs Abs: 2.2 10*3/uL (ref 0.7–4.0)
MCH: 26.4 pg (ref 26.0–34.0)
MCHC: 30.7 g/dL (ref 30.0–36.0)
MCV: 86 fL (ref 80.0–100.0)
Monocytes Absolute: 0.6 10*3/uL (ref 0.1–1.0)
Monocytes Relative: 7 %
Neutro Abs: 5.1 10*3/uL (ref 1.7–7.7)
Neutrophils Relative %: 64 %
Platelets: 408 10*3/uL — ABNORMAL HIGH (ref 150–400)
RBC: 3.71 MIL/uL — ABNORMAL LOW (ref 3.87–5.11)
RDW: 16 % — ABNORMAL HIGH (ref 11.5–15.5)
WBC: 8 10*3/uL (ref 4.0–10.5)
nRBC: 0 % (ref 0.0–0.2)

## 2021-07-25 LAB — URINALYSIS, ROUTINE W REFLEX MICROSCOPIC
Bilirubin Urine: NEGATIVE
Glucose, UA: NEGATIVE mg/dL
Hgb urine dipstick: NEGATIVE
Ketones, ur: NEGATIVE mg/dL
Nitrite: NEGATIVE
Protein, ur: NEGATIVE mg/dL
Specific Gravity, Urine: 1.02 (ref 1.005–1.030)
pH: 6 (ref 5.0–8.0)

## 2021-07-25 LAB — PREGNANCY, URINE: Preg Test, Ur: NEGATIVE

## 2021-07-25 LAB — LACTIC ACID, PLASMA
Lactic Acid, Venous: 0.8 mmol/L (ref 0.5–1.9)
Lactic Acid, Venous: 0.7 mmol/L (ref 0.5–1.9)

## 2021-07-25 MED ORDER — GABAPENTIN 300 MG PO CAPS: 600.0000 mg | ORAL_CAPSULE | Freq: Two times a day (BID) | ORAL | 2 refills | Status: DC

## 2021-07-25 MED ORDER — VANCOMYCIN HCL 1500 MG/300ML IV SOLN
1500.0000 mg | Freq: Once | INTRAVENOUS | Status: AC
Start: 2021-07-25 — End: 2021-07-25
  Administered 2021-07-25: 1500 mg via INTRAVENOUS
  Filled 2021-07-25: qty 300

## 2021-07-25 MED ORDER — IOHEXOL 300 MG/ML  SOLN
75.0000 mL | Freq: Once | INTRAMUSCULAR | Status: AC | PRN
  Administered 2021-07-25: 75 mL via INTRAVENOUS

## 2021-07-25 MED ORDER — SODIUM CHLORIDE 0.9 % IV BOLUS
1000.0000 mL | Freq: Once | INTRAVENOUS | Status: AC
  Administered 2021-07-25: 1000 mL via INTRAVENOUS

## 2021-07-25 NOTE — ED Provider Notes (Signed)
?Physical Exam  ?BP 95/68   Pulse 87   Temp 97.9 ?F (36.6 ?C)   Resp 18   LMP 11/24/2012   SpO2 92%  ? ?Physical Exam ?Vitals and nursing note reviewed.  ?Constitutional:   ?   General: She is not in acute distress. ?   Appearance: Normal appearance. She is well-developed. She is ill-appearing. She is not diaphoretic.  ?HENT:  ?   Head: Normocephalic and atraumatic.  ?Eyes:  ?   Conjunctiva/sclera: Conjunctivae normal.  ?Cardiovascular:  ?   Rate and Rhythm: Normal rate and regular rhythm.  ?   Pulses: Normal pulses.  ?   Heart sounds: No murmur heard. ?Pulmonary:  ?   Effort: Pulmonary effort is normal. No respiratory distress.  ?   Breath sounds: Normal breath sounds.  ?Abdominal:  ?   Palpations: Abdomen is soft.  ?   Tenderness: There is no abdominal tenderness.  ?Musculoskeletal:     ?   General: No swelling.  ?   Cervical back: Neck supple. No rigidity.  ?Skin: ?   General: Skin is warm and dry.  ?   Capillary Refill: Capillary refill takes less than 2 seconds.  ?   Coloration: Skin is not pale.  ?   Findings: Erythema and wound present.  ?   Comments: Obvious wound draining on the right hip near the greater trochanter ?Appears to be tunneling; soft, fluctuant erythema/edema along the surgical incision line superior to the drain opening with notable surrounding erythema ?Chronic sacral decubitus ulcer also noted to be somewhat well-healing on appearance, with visible granulation tissue appreciated and mild surrounding erythema suggestive of irritation rather than infection  ?Neurological:  ?   Mental Status: She is alert and oriented to person, place, and time.  ?Psychiatric:     ?   Mood and Affect: Mood normal.  ? ? ?Procedures  ?Procedures ? ?ED Course / MDM  ? ?Clinical Course as of 07/25/21 2346  ?Fri Jul 25, 2021  ?2315 Patient with significantly complicated history of paraplegia, osteomyelitis.  She has had recurrent infection in her right hip and buttock area.  She is currently on IV antibiotics  via PICC line.  Complaining of worsening of her wound.  Both hospitalist general surgery and orthopedics.  [facility] Na feel the patient is too complicated for here.  Discussed with orthopedics at [facility] where patient receives her orthopedic care and they are excepting her to the ED for an evaluation.  Patient agreeable to plan. [MB]  ?  ?Clinical Course User Index ?[MB] Hayden Rasmussen, MD  ? ?Medical Decision Making ?Amount and/or Complexity of Data Reviewed ?External Data Reviewed: labs and notes. ?Labs:  Decision-making details documented in ED Course. ?Radiology: independent interpretation performed. Decision-making details documented in ED Course. ?ECG/medicine tests: independent interpretation performed. Decision-making details documented in ED Course. ? ?Risk ?OTC drugs. ?Prescription drug management. ?Decision regarding hospitalization. ? ? ?Patient care transferred to myself from Evalee Jefferson, PA-C at 2030.  Patient's case, labs, imaging, history discussed at great length.  Awaiting consultation with hospitalist. ? ?2110 I consulted with Dr. Josephine Cables of the Hospitalist group.  We discussed the patient's case at length.  They recommended consultation with surgery, and possibly orthopedic surgery, then to follow up with him to report back.  I requested consultation with general surgery. ? ?2120 I consulted with Dr. Okey Dupre of General Surgery.  We discussed the patient's case at length.  With current imaging and previous history evidence of orthopedic hardware  involvement, they recommended consultation with orthopedic surgery.  I requested consultation with orthopedic surgery ? ?2130 I consulted with Dr. Amedeo Kinsman of Orthopedic Surgery.  We discussed the patient's case at length.  With current imaging and previous history evidence of orthopedic hardware involvement, with the extensiveness of the wound, the recurrent wound infection, and the possibility of osteomyelitis and/or necrotizing fasciitis, they  recommended possible transportation to a facility that treats more advanced cases.  The complexity based on imaging and physical exam appear to be severely complicated, and the patient is supposed to be a candidate for a extensive surgical intervention in July. ? ?These consultations were shared with my attending physician, Dr. Melina Copa and further discussed with the hospitalist Dr. Josephine Cables.  It was then recommended to consult with a hospital system such as [facility] emergency room fur advanced treatment and likely orthopedic intervention, but that the surgical intervention may be postponed due to decreased nutritional status per recent orthopedic consultation recommendations. ? ?2145 I consulted with PALS and Dr. Driscilla Moats of [facility] Orthopedic Surgery.  Discussed the patient's case at length.  With current imaging and lab findings, previous history/evidence of orthopedic hardware involvement, recurrent wound infection, and the complicated nature of this patient, it was recommended that the patient could be transferred to the ED for evaluation, but cannot be guaranteed admission.  Required this to be discussed with the patient before proceeding with the transportation.  Dr. Driscilla Moats not clinically concerned for necrotizing fasciitis based on the length of infection, current lab values, and described clinical presentation.  Suspicious of chronic and worsening wound that may require intervention, but is unsure whether it meets the requirements of emergent intervention.  Requested today's imaging to be put on a CD ROM to be brought with her to the ED for evaluation.  PALS confirmed transportation is being coordinated for the retrieval/transportation of the patient from [facility] to [facility] emergency department. ? ?Disposition: ?Patient has been updated on the status of her transportation and that this is an evaluation and not guaranteed admission.  Patient is agreeable to this and explains that she  fully understands this.  Patient will be provided with a CD-ROM of today's imaging to present upon arrival.  Spoke with the secretary directly, and confirmed that transportation is being coordinated. ? ?I discussed this case with my attending, Dr. Melina Copa, who agreed with the proposed treatment course and cosigned this note including patient's presenting symptoms, physical exam, and planned diagnostics and interventions.  Attending physician stated agreement with plan or made changes to plan which were implemented.   ? ? ?This chart was dictated using voice recognition software.  Despite best efforts to proofread, errors can occur which can change the documentation meaning. ?  ?Prince Rome, PA-C ?19/62/22 0016 ? ?  ?Hayden Rasmussen, MD ?07/26/21 1021 ? ?

## 2021-07-25 NOTE — ED Provider Notes (Signed)
?[facility] ?Provider Note ? ? ?CSN: [id number] ?Arrival date & time: 07/25/21  1346 ? ?  ? ?History ? ?Chief Complaint  ?Patient presents with  ? Wound Check  ? ? ?Kim [name] is a 46 y.o. female with a history of chronic cauda equina with cord injury who is wheelchair-bound who has been battling chronic sacral, heel and hip decubitus ulcers, presented with severe sepsis 6 months ago secondary to wound infections, had a repeat admission at [facility] earlier this month at which time she was diagnosed with a right proximal femur osteomyelitis.  She is currently on vancomycin and Invanz at home via PICC line which was originally started 2 weeks ago during admission at [facility] with plans for a 4 to 5-week treatment followed orthopedic follow-up with plans for surgical removal of the hardware in this hip, reporting increasing pain, redness and swelling at her right hip region.  She is concerned for worsening infection.  She does have an open wound and family has been trying to assist her with dressing changes on days when her home health care nurse does not come in (has home health care twice weekly).  Having increasing difficulty with wound changes, feels she may need to be placed in rehab for better wound care.  Over the past 3 days she has had increasing redness and swelling and pain which appears to be tracking from an open wound right hip which is being packed with Xeroform daily, tracks to a closed surgical incision site superior to the open wound.  She has hardware in this hip and is anticipating hardware removal in July with [facility] orthoped[facility].  She has had no problems giving herself the vancomycin but for some reason the Colbert Ewing would not run through the line for the past 2 days.   She denies documented fevers but has had chills.  Reports increasing yellow purulent discharge from the right hip wound. ? ?Of note patient is supposed to have a wound VAC at the  sacral decubitus site, the home nurse has opted to hold on this as there is some surrounding skin irritation and she was concerned about possibly being irritated due to the wound VAC itself.  For she does not present with the wound VAC at her sacrum at this time. ? ? ?The history is provided by the patient.  ? ?  ? ?Home Medications ?Prior to Admission medications   ?Medication Sig Start Date End Date Taking? Authorizing Provider  ?acetaminophen (TYLENOL) 650 MG suppository Place 650 mg rectally every 4 (four) hours as needed.    [provider]  ?Ascorbic Acid (VITAMIN C) 500 MG CAPS Take 500 mg by mouth daily. 02/10/21   Sharion Balloon, FNP  ?Buprenorphine HCl-Naloxone HCl 8-2 MG FILM Place 1 Film under the tongue 3 (three) times daily. 01/08/21   [provider]  ?CALCIUM-VITAMIN D PO Take 1 tablet by mouth daily.    [provider]  ?clonazePAM (KLONOPIN) 0.5 MG tablet Take 0.5 mg by mouth 2 (two) times daily. 02/05/21   [provider]  ?collagenase (SANTYL) ointment Apply topically daily. 04/17/21   Elgergawy, Silver Huguenin, MD  ?Elastic Bandages & Supports (HEEL/ANKLE PROTECTOR) MISC 1 each by Does not apply route at bedtime. 04/25/21   Sharion Balloon, FNP  ?ertapenem (INVANZ) 1 g injection Inject into the vein. 07/15/21 08/25/21  [provider]  ?ferrous sulfate 325 (65 FE) MG tablet Take 1 tablet (325 mg total)  by mouth 2 (two) times daily with a meal. 06/16/21   Sharion Balloon, FNP  ?folic acid (FOLVITE) 1 MG tablet Take 1 tablet (1 mg total) by mouth daily. 06/16/21   Sharion Balloon, FNP  ?Foot Care Products (SOCK AID) MISC 1 Device by Does not apply route daily. 04/25/21   Sharion Balloon, FNP  ?gabapentin (NEURONTIN) 300 MG capsule Take 2 capsules (600 mg total) by mouth 2 (two) times daily. 07/25/21   Sharion Balloon, FNP  ?midodrine (PROAMATINE) 10 MG tablet Take 1 tablet (10 mg total) by mouth 3 (three) times daily with meals. 06/16/21   Sharion Balloon, FNP   ?Misc. Devices (BED WEDGE) MISC 1 each by Does not apply route 2 (two) times daily. 04/25/21   Sharion Balloon, FNP  ?Misc. Devices (TRANSFER BENCH) MISC 1 Device by Does not apply route as needed. 02/20/19   Sharion Balloon, FNP  ?Misc. Devices [facility] CUSHION) MISC 1 application by Does not apply route daily. 05/03/20   Sharion Balloon, FNP  ?Multiple Vitamins-Calcium (ONE-A-DAY WOMENS PO) Take 2 tablets by mouth daily.    [provider]  ?NARCAN 4 MG/0.1ML LIQD nasal spray kit  04/27/16   [provider]  ?Nutritional Supplements (ENSURE HIGH PROTEIN) LIQD Take 237 mLs by mouth 4 (four) times daily - after meals and at bedtime. 04/25/21   Sharion Balloon, FNP  ?Nutritional Supplements (JUVEN NUTRIVIGOR) PACK Take 1 each by mouth 3 (three) times daily. 04/25/21   Sharion Balloon, FNP  ?Nutritional Supplements (PROMOD) LIQD Take 30 mLs by mouth 2 (two) times daily. 04/25/21   Sharion Balloon, FNP  ?polyethylene glycol powder (GLYCOLAX/MIRALAX) 17 GM/SCOOP powder Take 17 g by mouth daily. 05/29/21   Sharion Balloon, FNP  ?POTASSIUM PO Take 50 mg by mouth daily.    [provider]  ?silver sulfADIAZINE (SILVADENE) 1 % cream Apply 1 application topically daily. 05/09/21   Suzan Slick, NP  ?vancomycin (VANCOCIN) 10 G SOLR injection Inject into the vein. 07/14/21   [provider]  ?Zinc 50 MG CAPS Take 1 capsule (50 mg total) by mouth daily. 02/10/21   Sharion Balloon, FNP  ?   ? ?Allergies    ?Keflex [cephalexin]   ? ?Review of Systems   ?Review of Systems  ?Constitutional:  Positive for chills. Negative for fever.  ?HENT:  Negative for congestion and sore throat.   ?Eyes: Negative.   ?Respiratory:  Negative for chest tightness and shortness of breath.   ?Cardiovascular:  Negative for chest pain.  ?Gastrointestinal:  Negative for abdominal pain, nausea and vomiting.  ?Genitourinary: Negative.   ?Musculoskeletal:  Positive for arthralgias. Negative for joint swelling and neck  pain.  ?Skin:  Positive for color change and wound. Negative for rash.  ?Neurological:  Negative for dizziness, weakness, light-headedness, numbness and headaches.  ?Psychiatric/Behavioral: Negative.    ?All other systems reviewed and are negative. ? ?Physical Exam ?Updated Vital Signs ?BP 118/64   Pulse 92   Temp 98.1 ?F (36.7 ?C) (Oral)   Resp 18   LMP 11/24/2012   SpO2 92%  ?Physical Exam ?Vitals and nursing note reviewed.  ?Constitutional:   ?   Appearance: She is well-developed.  ?HENT:  ?   Head: Normocephalic and atraumatic.  ?Eyes:  ?   Conjunctiva/sclera: Conjunctivae normal.  ?Cardiovascular:  ?   Rate and Rhythm: Normal rate and regular rhythm.  ?   Heart sounds: Normal heart sounds.  ?  Pulmonary:  ?   Effort: Pulmonary effort is normal.  ?   Breath sounds: Normal breath sounds. No wheezing.  ?Abdominal:  ?   General: Bowel sounds are normal.  ?   Palpations: Abdomen is soft.  ?   Tenderness: There is no abdominal tenderness.  ?Musculoskeletal:     ?   General: Normal range of motion.  ?   Cervical back: Normal range of motion.  ?Skin: ?   General: Skin is warm and dry.  ?   Findings: Erythema present.  ?   Comments: Patient has a draining wound right hip near the greater trochanter which appears to be tunneling as there is soft fluctuant erythema along the surgical incision line superior to this draining opening.  There is some surrounding erythema as well.  She has what appears to be a well healing sacral decubitus ulcer with granulation tissue present.  ?Neurological:  ?   Mental Status: She is alert.  ? ? ?ED Results / Procedures / Treatments   ?Labs ?(all labs ordered are listed, but only abnormal results are displayed) ?Labs Reviewed  ?COMPREHENSIVE METABOLIC PANEL - Abnormal; Notable for the following components:  ?    Result Value  ? Creatinine, Ser 0.40 (*)   ? Calcium 8.5 (*)   ? Albumin 2.5 (*)   ? AST 14 (*)   ? All other components within normal limits  ?CBC WITH DIFFERENTIAL/PLATELET -  Abnormal; Notable for the following components:  ? RBC 3.71 (*)   ? Hemoglobin 9.8 (*)   ? HCT 31.9 (*)   ? RDW 16.0 (*)   ? Platelets 408 (*)   ? All other components within normal limits  ?CULTURE, BLOOD (ROUTI

## 2021-07-25 NOTE — Progress Notes (Signed)
? ?Virtual Visit  Note ?Due to COVID-19 pandemic this visit was conducted virtually. This visit type was conducted due to national recommendations for restrictions regarding the COVID-19 Pandemic (e.g. social distancing, sheltering in place) in an effort to limit this patient's exposure and mitigate transmission in our community. All issues noted in this document were discussed and addressed.  A physical exam was not performed with this format. ? ?I connected with Kim [name] on 07/25/21 at 10:00 AM  by telephone and verified that I am speaking with the correct person using two identifiers. Kim [name] is currently located at home and no one is currently with her during visit. The provider, [name], FNP is located in their office at time of visit. ? ?I discussed the limitations, risks, security and privacy concerns of performing an evaluation and management service by telephone and the availability of in person appointments. I also discussed with the patient that there may be a patient responsible charge related to this service. The patient expressed understanding and agreed to proceed. ? ?Ms. Zellner,you are scheduled for a virtual visit with your provider today.   ? ?Just as we do with appointments in the office, we must obtain your consent to participate.  Your consent will be active for this visit and any virtual visit you may have with one of our providers in the next 365 days.   ? ?If you have a MyChart account, I can also send a copy of this consent to you electronically.  All virtual visits are billed to your insurance company just like a traditional visit in the office.  As this is a virtual visit, video technology does not allow for your provider to perform a traditional examination.  This may limit your provider's ability to fully assess your condition.  If your provider identifies any concerns that need to be evaluated in person or the need to arrange testing such as labs, EKG, etc, we  will make arrangements to do so.   ? ?Although advances in technology are sophisticated, we cannot ensure that it will always work on either your end or our end.  If the connection with a video visit is poor, we may have to switch to a telephone visit.  With either a video or telephone visit, we are not always able to ensure that we have a secure connection.   I need to obtain your verbal consent now.   Are you willing to proceed with your visit today?  ? ?Kim [name] has provided verbal consent on 07/25/2021 for a virtual visit (video or telephone). ? ? ?[name], FNP ?07/25/2021  10:03 AM ? ? ? ?History and Present Illness: ? ?HPI ?Pt calls the office today requesting to refill gabapentin 600 mg BID. She currently goes to pain clinic, but states they will no longer prescribe this. She has cauda equina spinal cord injury and neuropathy pain in bilateral legs and feet.  ? ?She is having a great deal of pain in her hip with redness, swelling, and heat. BP is decreased. She was advised to go to ED yesterday. States she is waiting on a ride and will go today. She has had stage 3 pressure ulcer and is scheduled for hardware removal of that hip on 10/20/21. She currently has a wound vac in place.  ? ?Review of Systems  ?Constitutional:  Positive for malaise/fatigue.  ?Skin:   ?     wounds  ?All other systems reviewed and are negative. ? ? ?  Observations/Objective: ?No SOB or distress noted, anxious ? ?Assessment and Plan: ?1. Cauda equina spinal cord injury, sequela ? ?2. Pressure injury of left hip, stage 3 ([place]) ? ?3. Neuropathy ?Continue Gabapentin  ?- gabapentin (NEURONTIN) 300 MG capsule; Take 2 capsules (600 mg total) by mouth 2 (two) times daily.  Dispense: 120 capsule; Refill: 2 ? ?Pt will go to ED today to check on wound. Worrisome for sepsis given VS and wound worsening.  ? ?  ?I discussed the assessment and treatment plan with the patient. The patient was provided an opportunity to ask questions and  all were answered. The patient agreed with the plan and demonstrated an understanding of the instructions. ?  ?The patient was advised to call back or seek an in-person evaluation if the symptoms worsen or if the condition fails to improve as anticipated. ? ?The above assessment and management plan was discussed with the patient. The patient verbalized understanding of and has agreed to the management plan. Patient is aware to call the clinic if symptoms persist or worsen. Patient is aware when to return to the clinic for a follow-up visit. Patient educated on when it is appropriate to go to the emergency department.  ? ?Time call ended:  10:18 AM ? ?I provided 18 minutes of  non face-to-face time during this encounter. ? ? ? ?[name], FNP ? ? ?

## 2021-07-25 NOTE — ED Triage Notes (Signed)
Pt in c/o R hip pain states, "I was told to come here because I have a wound and they think that it is getting infected." Pt uses a wound vac on the L buttocks, pt taking antibiotics, pt states, "I have a PICC line and have a home health nurse that comes to check me.", pt has wounds L heel upon arrival, A&O x4, denies fever & chills ?

## 2021-07-25 NOTE — ED Notes (Signed)
EDP informed of bp ?

## 2021-07-25 NOTE — ED Notes (Signed)
Foley leg bag replaced with standard drainage bag. 700 mL of urine return ?

## 2021-07-25 NOTE — ED Triage Notes (Signed)
Indwelling foley in place upon arrival ?

## 2021-07-26 DIAGNOSIS — T8484XA Pain due to internal orthopedic prosthetic devices, implants and grafts, initial encounter: Secondary | ICD-10-CM | POA: Diagnosis not present

## 2021-07-26 DIAGNOSIS — L89624 Pressure ulcer of left heel, stage 4: Secondary | ICD-10-CM | POA: Diagnosis not present

## 2021-07-26 DIAGNOSIS — Z5189 Encounter for other specified aftercare: Secondary | ICD-10-CM | POA: Diagnosis not present

## 2021-07-26 DIAGNOSIS — M86451 Chronic osteomyelitis with draining sinus, right femur: Secondary | ICD-10-CM | POA: Diagnosis not present

## 2021-07-26 DIAGNOSIS — T8451XA Infection and inflammatory reaction due to internal right hip prosthesis, initial encounter: Secondary | ICD-10-CM | POA: Diagnosis not present

## 2021-07-26 DIAGNOSIS — Z96641 Presence of right artificial hip joint: Secondary | ICD-10-CM | POA: Diagnosis not present

## 2021-07-26 DIAGNOSIS — L8944 Pressure ulcer of contiguous site of back, buttock and hip, stage 4: Secondary | ICD-10-CM | POA: Diagnosis not present

## 2021-07-26 DIAGNOSIS — M8660 Other chronic osteomyelitis, unspecified site: Secondary | ICD-10-CM | POA: Diagnosis not present

## 2021-07-26 DIAGNOSIS — M8668 Other chronic osteomyelitis, other site: Secondary | ICD-10-CM | POA: Diagnosis not present

## 2021-07-26 DIAGNOSIS — L89219 Pressure ulcer of right hip, unspecified stage: Secondary | ICD-10-CM | POA: Diagnosis not present

## 2021-07-26 DIAGNOSIS — Z959 Presence of cardiac and vascular implant and graft, unspecified: Secondary | ICD-10-CM | POA: Diagnosis not present

## 2021-07-26 MED ORDER — MORPHINE SULFATE (PF) 4 MG/ML IV SOLN
4.0000 mg | Freq: Once | INTRAVENOUS | Status: AC
  Administered 2021-07-26: 4 mg via INTRAVENOUS
  Filled 2021-07-26: qty 1

## 2021-07-28 DIAGNOSIS — Z5181 Encounter for therapeutic drug level monitoring: Secondary | ICD-10-CM | POA: Diagnosis not present

## 2021-07-28 DIAGNOSIS — E43 Unspecified severe protein-calorie malnutrition: Secondary | ICD-10-CM | POA: Diagnosis not present

## 2021-07-28 DIAGNOSIS — D638 Anemia in other chronic diseases classified elsewhere: Secondary | ICD-10-CM | POA: Diagnosis not present

## 2021-07-28 DIAGNOSIS — M86151 Other acute osteomyelitis, right femur: Secondary | ICD-10-CM | POA: Diagnosis not present

## 2021-07-28 DIAGNOSIS — Z452 Encounter for adjustment and management of vascular access device: Secondary | ICD-10-CM | POA: Diagnosis not present

## 2021-07-28 DIAGNOSIS — I959 Hypotension, unspecified: Secondary | ICD-10-CM | POA: Diagnosis not present

## 2021-07-28 DIAGNOSIS — L89624 Pressure ulcer of left heel, stage 4: Secondary | ICD-10-CM | POA: Diagnosis not present

## 2021-07-28 DIAGNOSIS — G8220 Paraplegia, unspecified: Secondary | ICD-10-CM | POA: Diagnosis not present

## 2021-07-28 DIAGNOSIS — L89324 Pressure ulcer of left buttock, stage 4: Secondary | ICD-10-CM | POA: Diagnosis not present

## 2021-07-28 DIAGNOSIS — F17210 Nicotine dependence, cigarettes, uncomplicated: Secondary | ICD-10-CM | POA: Diagnosis not present

## 2021-07-28 DIAGNOSIS — L89214 Pressure ulcer of right hip, stage 4: Secondary | ICD-10-CM | POA: Diagnosis not present

## 2021-07-28 DIAGNOSIS — L89224 Pressure ulcer of left hip, stage 4: Secondary | ICD-10-CM | POA: Diagnosis not present

## 2021-07-30 LAB — CULTURE, BLOOD (ROUTINE X 2)
Culture: NO GROWTH
Culture: NO GROWTH
Special Requests: ADEQUATE
Special Requests: ADEQUATE

## 2021-07-31 DIAGNOSIS — G8220 Paraplegia, unspecified: Secondary | ICD-10-CM | POA: Diagnosis not present

## 2021-07-31 DIAGNOSIS — L89224 Pressure ulcer of left hip, stage 4: Secondary | ICD-10-CM | POA: Diagnosis not present

## 2021-07-31 DIAGNOSIS — D638 Anemia in other chronic diseases classified elsewhere: Secondary | ICD-10-CM | POA: Diagnosis not present

## 2021-07-31 DIAGNOSIS — M86151 Other acute osteomyelitis, right femur: Secondary | ICD-10-CM | POA: Diagnosis not present

## 2021-07-31 DIAGNOSIS — Z5181 Encounter for therapeutic drug level monitoring: Secondary | ICD-10-CM | POA: Diagnosis not present

## 2021-07-31 DIAGNOSIS — Z792 Long term (current) use of antibiotics: Secondary | ICD-10-CM | POA: Diagnosis not present

## 2021-07-31 DIAGNOSIS — L89324 Pressure ulcer of left buttock, stage 4: Secondary | ICD-10-CM | POA: Diagnosis not present

## 2021-07-31 DIAGNOSIS — L89214 Pressure ulcer of right hip, stage 4: Secondary | ICD-10-CM | POA: Diagnosis not present

## 2021-07-31 DIAGNOSIS — E43 Unspecified severe protein-calorie malnutrition: Secondary | ICD-10-CM | POA: Diagnosis not present

## 2021-07-31 DIAGNOSIS — L89314 Pressure ulcer of right buttock, stage 4: Secondary | ICD-10-CM | POA: Diagnosis not present

## 2021-07-31 DIAGNOSIS — L89614 Pressure ulcer of right heel, stage 4: Secondary | ICD-10-CM | POA: Diagnosis not present

## 2021-07-31 DIAGNOSIS — F17210 Nicotine dependence, cigarettes, uncomplicated: Secondary | ICD-10-CM | POA: Diagnosis not present

## 2021-07-31 DIAGNOSIS — L89624 Pressure ulcer of left heel, stage 4: Secondary | ICD-10-CM | POA: Diagnosis not present

## 2021-07-31 DIAGNOSIS — I959 Hypotension, unspecified: Secondary | ICD-10-CM | POA: Diagnosis not present

## 2021-08-01 ENCOUNTER — Other Ambulatory Visit: Payer: Self-pay

## 2021-08-01 ENCOUNTER — Encounter: Payer: Self-pay | Admitting: Internal Medicine

## 2021-08-01 ENCOUNTER — Ambulatory Visit (INDEPENDENT_AMBULATORY_CARE_PROVIDER_SITE_OTHER): Payer: Medicare Other | Admitting: Internal Medicine

## 2021-08-01 ENCOUNTER — Telehealth: Payer: Self-pay

## 2021-08-01 VITALS — BP 105/69 | HR 91 | Temp 97.6°F | Ht 66.0 in | Wt 138.0 lb

## 2021-08-01 DIAGNOSIS — L97429 Non-pressure chronic ulcer of left heel and midfoot with unspecified severity: Secondary | ICD-10-CM | POA: Diagnosis not present

## 2021-08-01 DIAGNOSIS — L89204 Pressure ulcer of unspecified hip, stage 4: Secondary | ICD-10-CM

## 2021-08-01 DIAGNOSIS — M86159 Other acute osteomyelitis, unspecified femur: Secondary | ICD-10-CM | POA: Diagnosis not present

## 2021-08-01 DIAGNOSIS — M8660 Other chronic osteomyelitis, unspecified site: Secondary | ICD-10-CM | POA: Diagnosis not present

## 2021-08-01 DIAGNOSIS — G8220 Paraplegia, unspecified: Secondary | ICD-10-CM | POA: Diagnosis not present

## 2021-08-01 NOTE — Telephone Encounter (Signed)
Called [facility] to communicate verbal orders from Dr. Gale Journey to continue patient's IV ertapenem and vancomycin until a stop date of 08/13/21, and then to remove PICC at that time. ? ?Spoke with Lonn Georgia who stated no pharmacist was available to receive verbal orders at this time; this RN will send orders via fax or Epic inbasket. ? ?Binnie Kand, RN  ?
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
## 2021-08-01 NOTE — Patient Instructions (Signed)
We discussed the recurrent nature of ulcer associated infection ? ?We discussed abx will not cure this, especially that it has shown progression on abx, and also with hardware presence that is infected. This needs to be I&D'ed and removed ? ?Due to high recurrent nature of this infection, you are at risk for antibiotics complication (toxicity or other infection or resistance) if we continued prolonged antibiotic courses ? ? ?I would recommend 2 weeks treatment at a time (oral antibiotics is ok if there are options for that) ? ? ?Continue ertapnem and vancomycin until 5/10 then remove picc and stop ? ? ?See me in around 3 weeks ?

## 2021-08-02 DIAGNOSIS — L89224 Pressure ulcer of left hip, stage 4: Secondary | ICD-10-CM | POA: Diagnosis not present

## 2021-08-04 DIAGNOSIS — M869 Osteomyelitis, unspecified: Secondary | ICD-10-CM | POA: Diagnosis not present

## 2021-08-04 DIAGNOSIS — L89324 Pressure ulcer of left buttock, stage 4: Secondary | ICD-10-CM | POA: Diagnosis not present

## 2021-08-04 DIAGNOSIS — I959 Hypotension, unspecified: Secondary | ICD-10-CM | POA: Diagnosis not present

## 2021-08-04 DIAGNOSIS — M86451 Chronic osteomyelitis with draining sinus, right femur: Secondary | ICD-10-CM | POA: Diagnosis not present

## 2021-08-04 DIAGNOSIS — E43 Unspecified severe protein-calorie malnutrition: Secondary | ICD-10-CM | POA: Diagnosis not present

## 2021-08-04 DIAGNOSIS — L89624 Pressure ulcer of left heel, stage 4: Secondary | ICD-10-CM | POA: Diagnosis not present

## 2021-08-04 DIAGNOSIS — L89224 Pressure ulcer of left hip, stage 4: Secondary | ICD-10-CM | POA: Diagnosis not present

## 2021-08-04 DIAGNOSIS — Z452 Encounter for adjustment and management of vascular access device: Secondary | ICD-10-CM | POA: Diagnosis not present

## 2021-08-04 DIAGNOSIS — Z5181 Encounter for therapeutic drug level monitoring: Secondary | ICD-10-CM | POA: Diagnosis not present

## 2021-08-04 DIAGNOSIS — L89214 Pressure ulcer of right hip, stage 4: Secondary | ICD-10-CM | POA: Diagnosis not present

## 2021-08-04 DIAGNOSIS — F17210 Nicotine dependence, cigarettes, uncomplicated: Secondary | ICD-10-CM | POA: Diagnosis not present

## 2021-08-04 DIAGNOSIS — D638 Anemia in other chronic diseases classified elsewhere: Secondary | ICD-10-CM | POA: Diagnosis not present

## 2021-08-04 DIAGNOSIS — G8220 Paraplegia, unspecified: Secondary | ICD-10-CM | POA: Diagnosis not present

## 2021-08-05 DIAGNOSIS — N319 Neuromuscular dysfunction of bladder, unspecified: Secondary | ICD-10-CM | POA: Diagnosis not present

## 2021-08-07 DIAGNOSIS — I959 Hypotension, unspecified: Secondary | ICD-10-CM | POA: Diagnosis not present

## 2021-08-07 DIAGNOSIS — E43 Unspecified severe protein-calorie malnutrition: Secondary | ICD-10-CM | POA: Diagnosis not present

## 2021-08-07 DIAGNOSIS — L89224 Pressure ulcer of left hip, stage 4: Secondary | ICD-10-CM | POA: Diagnosis not present

## 2021-08-07 DIAGNOSIS — L89214 Pressure ulcer of right hip, stage 4: Secondary | ICD-10-CM | POA: Diagnosis not present

## 2021-08-07 DIAGNOSIS — G8220 Paraplegia, unspecified: Secondary | ICD-10-CM | POA: Diagnosis not present

## 2021-08-07 DIAGNOSIS — D638 Anemia in other chronic diseases classified elsewhere: Secondary | ICD-10-CM | POA: Diagnosis not present

## 2021-08-07 DIAGNOSIS — F17210 Nicotine dependence, cigarettes, uncomplicated: Secondary | ICD-10-CM | POA: Diagnosis not present

## 2021-08-07 DIAGNOSIS — L89624 Pressure ulcer of left heel, stage 4: Secondary | ICD-10-CM | POA: Diagnosis not present

## 2021-08-07 DIAGNOSIS — L89324 Pressure ulcer of left buttock, stage 4: Secondary | ICD-10-CM | POA: Diagnosis not present

## 2021-08-07 DIAGNOSIS — M869 Osteomyelitis, unspecified: Secondary | ICD-10-CM | POA: Diagnosis not present

## 2021-08-08 DIAGNOSIS — M86159 Other acute osteomyelitis, unspecified femur: Secondary | ICD-10-CM | POA: Diagnosis not present

## 2021-08-11 DIAGNOSIS — L89224 Pressure ulcer of left hip, stage 4: Secondary | ICD-10-CM | POA: Diagnosis not present

## 2021-08-11 DIAGNOSIS — E43 Unspecified severe protein-calorie malnutrition: Secondary | ICD-10-CM | POA: Diagnosis not present

## 2021-08-11 DIAGNOSIS — I959 Hypotension, unspecified: Secondary | ICD-10-CM | POA: Diagnosis not present

## 2021-08-11 DIAGNOSIS — Z466 Encounter for fitting and adjustment of urinary device: Secondary | ICD-10-CM | POA: Diagnosis not present

## 2021-08-11 DIAGNOSIS — L89624 Pressure ulcer of left heel, stage 4: Secondary | ICD-10-CM | POA: Diagnosis not present

## 2021-08-11 DIAGNOSIS — D638 Anemia in other chronic diseases classified elsewhere: Secondary | ICD-10-CM | POA: Diagnosis not present

## 2021-08-11 DIAGNOSIS — G8220 Paraplegia, unspecified: Secondary | ICD-10-CM | POA: Diagnosis not present

## 2021-08-11 DIAGNOSIS — F17210 Nicotine dependence, cigarettes, uncomplicated: Secondary | ICD-10-CM | POA: Diagnosis not present

## 2021-08-11 DIAGNOSIS — M86451 Chronic osteomyelitis with draining sinus, right femur: Secondary | ICD-10-CM | POA: Diagnosis not present

## 2021-08-11 DIAGNOSIS — L89324 Pressure ulcer of left buttock, stage 4: Secondary | ICD-10-CM | POA: Diagnosis not present

## 2021-08-11 DIAGNOSIS — L89214 Pressure ulcer of right hip, stage 4: Secondary | ICD-10-CM | POA: Diagnosis not present

## 2021-08-13 DIAGNOSIS — L89224 Pressure ulcer of left hip, stage 4: Secondary | ICD-10-CM | POA: Diagnosis not present

## 2021-08-13 DIAGNOSIS — L89624 Pressure ulcer of left heel, stage 4: Secondary | ICD-10-CM | POA: Diagnosis not present

## 2021-08-13 DIAGNOSIS — Z03818 Encounter for observation for suspected exposure to other biological agents ruled out: Secondary | ICD-10-CM | POA: Diagnosis not present

## 2021-08-13 DIAGNOSIS — T8189XA Other complications of procedures, not elsewhere classified, initial encounter: Secondary | ICD-10-CM | POA: Diagnosis not present

## 2021-08-14 ENCOUNTER — Ambulatory Visit (INDEPENDENT_AMBULATORY_CARE_PROVIDER_SITE_OTHER): Payer: Medicare Other | Admitting: Family

## 2021-08-14 DIAGNOSIS — D638 Anemia in other chronic diseases classified elsewhere: Secondary | ICD-10-CM | POA: Diagnosis not present

## 2021-08-14 DIAGNOSIS — L89214 Pressure ulcer of right hip, stage 4: Secondary | ICD-10-CM | POA: Diagnosis not present

## 2021-08-14 DIAGNOSIS — G8220 Paraplegia, unspecified: Secondary | ICD-10-CM | POA: Diagnosis not present

## 2021-08-14 DIAGNOSIS — L89324 Pressure ulcer of left buttock, stage 4: Secondary | ICD-10-CM | POA: Diagnosis not present

## 2021-08-14 DIAGNOSIS — L89224 Pressure ulcer of left hip, stage 4: Secondary | ICD-10-CM | POA: Diagnosis not present

## 2021-08-14 DIAGNOSIS — Z91199 Patient's noncompliance with other medical treatment and regimen due to unspecified reason: Secondary | ICD-10-CM

## 2021-08-14 DIAGNOSIS — F17210 Nicotine dependence, cigarettes, uncomplicated: Secondary | ICD-10-CM | POA: Diagnosis not present

## 2021-08-14 DIAGNOSIS — L89624 Pressure ulcer of left heel, stage 4: Secondary | ICD-10-CM | POA: Diagnosis not present

## 2021-08-14 DIAGNOSIS — S39012D Strain of muscle, fascia and tendon of lower back, subsequent encounter: Secondary | ICD-10-CM | POA: Diagnosis not present

## 2021-08-14 DIAGNOSIS — I959 Hypotension, unspecified: Secondary | ICD-10-CM | POA: Diagnosis not present

## 2021-08-14 DIAGNOSIS — E43 Unspecified severe protein-calorie malnutrition: Secondary | ICD-10-CM | POA: Diagnosis not present

## 2021-08-14 NOTE — Progress Notes (Signed)
Attempted to contact patient multiple times. 3 voicemail left and no return call. Will close visit out.  ? ?[name], FNP ? ?

## 2021-08-18 DIAGNOSIS — L89224 Pressure ulcer of left hip, stage 4: Secondary | ICD-10-CM | POA: Diagnosis not present

## 2021-08-18 DIAGNOSIS — F17210 Nicotine dependence, cigarettes, uncomplicated: Secondary | ICD-10-CM | POA: Diagnosis not present

## 2021-08-18 DIAGNOSIS — L89324 Pressure ulcer of left buttock, stage 4: Secondary | ICD-10-CM | POA: Diagnosis not present

## 2021-08-18 DIAGNOSIS — L89214 Pressure ulcer of right hip, stage 4: Secondary | ICD-10-CM | POA: Diagnosis not present

## 2021-08-18 DIAGNOSIS — D638 Anemia in other chronic diseases classified elsewhere: Secondary | ICD-10-CM | POA: Diagnosis not present

## 2021-08-18 DIAGNOSIS — E43 Unspecified severe protein-calorie malnutrition: Secondary | ICD-10-CM | POA: Diagnosis not present

## 2021-08-18 DIAGNOSIS — L89624 Pressure ulcer of left heel, stage 4: Secondary | ICD-10-CM | POA: Diagnosis not present

## 2021-08-18 DIAGNOSIS — I959 Hypotension, unspecified: Secondary | ICD-10-CM | POA: Diagnosis not present

## 2021-08-18 DIAGNOSIS — G8220 Paraplegia, unspecified: Secondary | ICD-10-CM | POA: Diagnosis not present

## 2021-08-20 DIAGNOSIS — L89154 Pressure ulcer of sacral region, stage 4: Secondary | ICD-10-CM | POA: Diagnosis not present

## 2021-08-20 DIAGNOSIS — S14109A Unspecified injury at unspecified level of cervical spinal cord, initial encounter: Secondary | ICD-10-CM | POA: Diagnosis not present

## 2021-08-20 DIAGNOSIS — Z13818 Encounter for screening for other digestive system disorders: Secondary | ICD-10-CM | POA: Diagnosis not present

## 2021-08-20 DIAGNOSIS — L89319 Pressure ulcer of right buttock, unspecified stage: Secondary | ICD-10-CM | POA: Diagnosis not present

## 2021-08-20 DIAGNOSIS — G8220 Paraplegia, unspecified: Secondary | ICD-10-CM | POA: Diagnosis not present

## 2021-08-20 DIAGNOSIS — M4716 Other spondylosis with myelopathy, lumbar region: Secondary | ICD-10-CM | POA: Diagnosis not present

## 2021-08-21 DIAGNOSIS — L89314 Pressure ulcer of right buttock, stage 4: Secondary | ICD-10-CM | POA: Diagnosis not present

## 2021-08-21 DIAGNOSIS — L89324 Pressure ulcer of left buttock, stage 4: Secondary | ICD-10-CM | POA: Diagnosis not present

## 2021-08-21 DIAGNOSIS — L89214 Pressure ulcer of right hip, stage 4: Secondary | ICD-10-CM | POA: Diagnosis not present

## 2021-08-21 DIAGNOSIS — L89614 Pressure ulcer of right heel, stage 4: Secondary | ICD-10-CM | POA: Diagnosis not present

## 2021-08-21 DIAGNOSIS — L89624 Pressure ulcer of left heel, stage 4: Secondary | ICD-10-CM | POA: Diagnosis not present

## 2021-08-22 ENCOUNTER — Other Ambulatory Visit: Payer: Self-pay

## 2021-08-22 ENCOUNTER — Telehealth: Payer: Self-pay

## 2021-08-22 ENCOUNTER — Telehealth (INDEPENDENT_AMBULATORY_CARE_PROVIDER_SITE_OTHER): Payer: Medicare Other | Admitting: Internal Medicine

## 2021-08-22 DIAGNOSIS — T847XXD Infection and inflammatory reaction due to other internal orthopedic prosthetic devices, implants and grafts, subsequent encounter: Secondary | ICD-10-CM

## 2021-08-22 DIAGNOSIS — M8668 Other chronic osteomyelitis, other site: Secondary | ICD-10-CM

## 2021-08-22 DIAGNOSIS — L89214 Pressure ulcer of right hip, stage 4: Secondary | ICD-10-CM | POA: Diagnosis not present

## 2021-08-22 DIAGNOSIS — L89224 Pressure ulcer of left hip, stage 4: Secondary | ICD-10-CM | POA: Diagnosis not present

## 2021-08-22 DIAGNOSIS — M8660 Other chronic osteomyelitis, unspecified site: Secondary | ICD-10-CM

## 2021-08-22 DIAGNOSIS — L89624 Pressure ulcer of left heel, stage 4: Secondary | ICD-10-CM | POA: Diagnosis not present

## 2021-08-22 NOTE — Telephone Encounter (Signed)
Left patient a voice mail to call back to schedule 3-4 week follow up with Dr. Gale Journey

## 2021-08-22 NOTE — Patient Instructions (Addendum)
Await orthopedic reevaluation to remove right hip/femur hardware   Follow up with me in 4-6 weeks Remain off abx
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
## 2021-08-25 ENCOUNTER — Telehealth: Payer: Self-pay | Admitting: Emergency Medicine

## 2021-08-25 DIAGNOSIS — L8944 Pressure ulcer of contiguous site of back, buttock and hip, stage 4: Secondary | ICD-10-CM | POA: Diagnosis not present

## 2021-08-25 DIAGNOSIS — L89224 Pressure ulcer of left hip, stage 4: Secondary | ICD-10-CM | POA: Diagnosis not present

## 2021-08-25 DIAGNOSIS — I959 Hypotension, unspecified: Secondary | ICD-10-CM | POA: Diagnosis not present

## 2021-08-25 DIAGNOSIS — E43 Unspecified severe protein-calorie malnutrition: Secondary | ICD-10-CM | POA: Diagnosis not present

## 2021-08-25 DIAGNOSIS — L89324 Pressure ulcer of left buttock, stage 4: Secondary | ICD-10-CM | POA: Diagnosis not present

## 2021-08-25 DIAGNOSIS — G8220 Paraplegia, unspecified: Secondary | ICD-10-CM | POA: Diagnosis not present

## 2021-08-25 DIAGNOSIS — M86451 Chronic osteomyelitis with draining sinus, right femur: Secondary | ICD-10-CM | POA: Diagnosis not present

## 2021-08-25 DIAGNOSIS — L89214 Pressure ulcer of right hip, stage 4: Secondary | ICD-10-CM | POA: Diagnosis not present

## 2021-08-25 DIAGNOSIS — L89624 Pressure ulcer of left heel, stage 4: Secondary | ICD-10-CM | POA: Diagnosis not present

## 2021-08-25 DIAGNOSIS — D638 Anemia in other chronic diseases classified elsewhere: Secondary | ICD-10-CM | POA: Diagnosis not present

## 2021-08-25 NOTE — Telephone Encounter (Signed)
Yes, ok to change parameters. I am unsure the current one, but can change to >140 pulse to call.

## 2021-08-25 NOTE — Telephone Encounter (Signed)
HR 120's, Pain scale of 7/10  Wants to know can if can get parameters changed.   Please advise.    [phone] Alyse Low) [place] to leave message

## 2021-08-25 NOTE — Telephone Encounter (Signed)
Gave verbal orders to [facility] for Pulse > 140

## 2021-08-27 DIAGNOSIS — T8450XA Infection and inflammatory reaction due to unspecified internal joint prosthesis, initial encounter: Secondary | ICD-10-CM | POA: Diagnosis not present

## 2021-08-27 DIAGNOSIS — T8450XD Infection and inflammatory reaction due to unspecified internal joint prosthesis, subsequent encounter: Secondary | ICD-10-CM | POA: Diagnosis not present

## 2021-08-27 DIAGNOSIS — Z79899 Other long term (current) drug therapy: Secondary | ICD-10-CM | POA: Diagnosis not present

## 2021-08-28 DIAGNOSIS — M86451 Chronic osteomyelitis with draining sinus, right femur: Secondary | ICD-10-CM | POA: Diagnosis not present

## 2021-08-28 DIAGNOSIS — L89214 Pressure ulcer of right hip, stage 4: Secondary | ICD-10-CM | POA: Diagnosis not present

## 2021-08-28 DIAGNOSIS — E43 Unspecified severe protein-calorie malnutrition: Secondary | ICD-10-CM | POA: Diagnosis not present

## 2021-08-28 DIAGNOSIS — L89324 Pressure ulcer of left buttock, stage 4: Secondary | ICD-10-CM | POA: Diagnosis not present

## 2021-08-28 DIAGNOSIS — G8220 Paraplegia, unspecified: Secondary | ICD-10-CM | POA: Diagnosis not present

## 2021-08-28 DIAGNOSIS — D638 Anemia in other chronic diseases classified elsewhere: Secondary | ICD-10-CM | POA: Diagnosis not present

## 2021-08-28 DIAGNOSIS — L89224 Pressure ulcer of left hip, stage 4: Secondary | ICD-10-CM | POA: Diagnosis not present

## 2021-08-28 DIAGNOSIS — I959 Hypotension, unspecified: Secondary | ICD-10-CM | POA: Diagnosis not present

## 2021-08-28 DIAGNOSIS — L89624 Pressure ulcer of left heel, stage 4: Secondary | ICD-10-CM | POA: Diagnosis not present

## 2021-09-02 ENCOUNTER — Telehealth: Payer: Self-pay | Admitting: *Deleted

## 2021-09-02 DIAGNOSIS — M86451 Chronic osteomyelitis with draining sinus, right femur: Secondary | ICD-10-CM | POA: Diagnosis not present

## 2021-09-02 DIAGNOSIS — G8220 Paraplegia, unspecified: Secondary | ICD-10-CM | POA: Diagnosis not present

## 2021-09-02 DIAGNOSIS — D638 Anemia in other chronic diseases classified elsewhere: Secondary | ICD-10-CM | POA: Diagnosis not present

## 2021-09-02 DIAGNOSIS — L89624 Pressure ulcer of left heel, stage 4: Secondary | ICD-10-CM | POA: Diagnosis not present

## 2021-09-02 DIAGNOSIS — L89224 Pressure ulcer of left hip, stage 4: Secondary | ICD-10-CM | POA: Diagnosis not present

## 2021-09-02 DIAGNOSIS — L89324 Pressure ulcer of left buttock, stage 4: Secondary | ICD-10-CM | POA: Diagnosis not present

## 2021-09-02 DIAGNOSIS — L89214 Pressure ulcer of right hip, stage 4: Secondary | ICD-10-CM | POA: Diagnosis not present

## 2021-09-02 DIAGNOSIS — E43 Unspecified severe protein-calorie malnutrition: Secondary | ICD-10-CM | POA: Diagnosis not present

## 2021-09-02 DIAGNOSIS — I959 Hypotension, unspecified: Secondary | ICD-10-CM | POA: Diagnosis not present

## 2021-09-02 MED ORDER — NICOTINE 21 MG/24HR TD PT24: 21.0000 mg | MEDICATED_PATCH | Freq: Every day | TRANSDERMAL | 1 refills | Status: DC

## 2021-09-02 NOTE — Telephone Encounter (Signed)
[facility] - [facility] called and says that when pt was initially checked today BP was 80/40, O2 was 89%.  Dressings were changed and pt moved around a little and it came up to 104/60 and O2 came up to 92%.  She advised her to deep breathe and decrease smoking. She is basically bed bound.   Pt is willing to try a nicotine patch, if we are willing to send to pharm

## 2021-09-02 NOTE — Telephone Encounter (Signed)
Nicotine patch Prescription sent to pharmacy

## 2021-09-05 DIAGNOSIS — L89624 Pressure ulcer of left heel, stage 4: Secondary | ICD-10-CM | POA: Diagnosis not present

## 2021-09-05 DIAGNOSIS — L89324 Pressure ulcer of left buttock, stage 4: Secondary | ICD-10-CM | POA: Diagnosis not present

## 2021-09-05 DIAGNOSIS — I959 Hypotension, unspecified: Secondary | ICD-10-CM | POA: Diagnosis not present

## 2021-09-05 DIAGNOSIS — L89224 Pressure ulcer of left hip, stage 4: Secondary | ICD-10-CM | POA: Diagnosis not present

## 2021-09-05 DIAGNOSIS — E43 Unspecified severe protein-calorie malnutrition: Secondary | ICD-10-CM | POA: Diagnosis not present

## 2021-09-05 DIAGNOSIS — L89214 Pressure ulcer of right hip, stage 4: Secondary | ICD-10-CM | POA: Diagnosis not present

## 2021-09-05 DIAGNOSIS — G8220 Paraplegia, unspecified: Secondary | ICD-10-CM | POA: Diagnosis not present

## 2021-09-05 DIAGNOSIS — D638 Anemia in other chronic diseases classified elsewhere: Secondary | ICD-10-CM | POA: Diagnosis not present

## 2021-09-05 DIAGNOSIS — M86451 Chronic osteomyelitis with draining sinus, right femur: Secondary | ICD-10-CM | POA: Diagnosis not present

## 2021-09-07 DIAGNOSIS — D638 Anemia in other chronic diseases classified elsewhere: Secondary | ICD-10-CM | POA: Diagnosis not present

## 2021-09-07 DIAGNOSIS — M86451 Chronic osteomyelitis with draining sinus, right femur: Secondary | ICD-10-CM | POA: Diagnosis not present

## 2021-09-07 DIAGNOSIS — E43 Unspecified severe protein-calorie malnutrition: Secondary | ICD-10-CM | POA: Diagnosis not present

## 2021-09-07 DIAGNOSIS — G8220 Paraplegia, unspecified: Secondary | ICD-10-CM | POA: Diagnosis not present

## 2021-09-07 DIAGNOSIS — L89324 Pressure ulcer of left buttock, stage 4: Secondary | ICD-10-CM | POA: Diagnosis not present

## 2021-09-07 DIAGNOSIS — L89214 Pressure ulcer of right hip, stage 4: Secondary | ICD-10-CM | POA: Diagnosis not present

## 2021-09-07 DIAGNOSIS — L89624 Pressure ulcer of left heel, stage 4: Secondary | ICD-10-CM | POA: Diagnosis not present

## 2021-09-07 DIAGNOSIS — L89224 Pressure ulcer of left hip, stage 4: Secondary | ICD-10-CM | POA: Diagnosis not present

## 2021-09-07 DIAGNOSIS — I959 Hypotension, unspecified: Secondary | ICD-10-CM | POA: Diagnosis not present

## 2021-09-08 DIAGNOSIS — L89209 Pressure ulcer of unspecified hip, unspecified stage: Secondary | ICD-10-CM | POA: Diagnosis not present

## 2021-09-08 DIAGNOSIS — Z96641 Presence of right artificial hip joint: Secondary | ICD-10-CM | POA: Diagnosis not present

## 2021-09-08 DIAGNOSIS — G8929 Other chronic pain: Secondary | ICD-10-CM | POA: Diagnosis not present

## 2021-09-08 DIAGNOSIS — S91342A Puncture wound with foreign body, left foot, initial encounter: Secondary | ICD-10-CM | POA: Diagnosis not present

## 2021-09-08 DIAGNOSIS — Z79899 Other long term (current) drug therapy: Secondary | ICD-10-CM | POA: Diagnosis not present

## 2021-09-08 DIAGNOSIS — G8220 Paraplegia, unspecified: Secondary | ICD-10-CM | POA: Diagnosis not present

## 2021-09-08 DIAGNOSIS — M47815 Spondylosis without myelopathy or radiculopathy, thoracolumbar region: Secondary | ICD-10-CM | POA: Diagnosis not present

## 2021-09-08 DIAGNOSIS — M1990 Unspecified osteoarthritis, unspecified site: Secondary | ICD-10-CM | POA: Diagnosis not present

## 2021-09-08 DIAGNOSIS — M25552 Pain in left hip: Secondary | ICD-10-CM | POA: Diagnosis not present

## 2021-09-08 DIAGNOSIS — M545 Low back pain, unspecified: Secondary | ICD-10-CM | POA: Diagnosis not present

## 2021-09-08 DIAGNOSIS — R600 Localized edema: Secondary | ICD-10-CM | POA: Diagnosis not present

## 2021-09-08 DIAGNOSIS — J9600 Acute respiratory failure, unspecified whether with hypoxia or hypercapnia: Secondary | ICD-10-CM | POA: Diagnosis not present

## 2021-09-08 DIAGNOSIS — Z993 Dependence on wheelchair: Secondary | ICD-10-CM | POA: Diagnosis not present

## 2021-09-08 DIAGNOSIS — M86451 Chronic osteomyelitis with draining sinus, right femur: Secondary | ICD-10-CM | POA: Diagnosis not present

## 2021-09-08 DIAGNOSIS — G8918 Other acute postprocedural pain: Secondary | ICD-10-CM | POA: Diagnosis not present

## 2021-09-08 DIAGNOSIS — M7989 Other specified soft tissue disorders: Secondary | ICD-10-CM | POA: Diagnosis not present

## 2021-09-08 DIAGNOSIS — I9589 Other hypotension: Secondary | ICD-10-CM | POA: Diagnosis not present

## 2021-09-08 DIAGNOSIS — Z472 Encounter for removal of internal fixation device: Secondary | ICD-10-CM | POA: Diagnosis not present

## 2021-09-08 DIAGNOSIS — N329 Bladder disorder, unspecified: Secondary | ICD-10-CM | POA: Diagnosis not present

## 2021-09-08 DIAGNOSIS — R0600 Dyspnea, unspecified: Secondary | ICD-10-CM | POA: Diagnosis not present

## 2021-09-08 DIAGNOSIS — M868X6 Other osteomyelitis, lower leg: Secondary | ICD-10-CM | POA: Diagnosis not present

## 2021-09-08 DIAGNOSIS — K029 Dental caries, unspecified: Secondary | ICD-10-CM | POA: Diagnosis not present

## 2021-09-08 DIAGNOSIS — R6521 Severe sepsis with septic shock: Secondary | ICD-10-CM | POA: Diagnosis not present

## 2021-09-08 DIAGNOSIS — B954 Other streptococcus as the cause of diseases classified elsewhere: Secondary | ICD-10-CM | POA: Diagnosis not present

## 2021-09-08 DIAGNOSIS — J9601 Acute respiratory failure with hypoxia: Secondary | ICD-10-CM | POA: Diagnosis not present

## 2021-09-08 DIAGNOSIS — M25551 Pain in right hip: Secondary | ICD-10-CM | POA: Diagnosis not present

## 2021-09-08 DIAGNOSIS — M1711 Unilateral primary osteoarthritis, right knee: Secondary | ICD-10-CM | POA: Diagnosis not present

## 2021-09-08 DIAGNOSIS — E559 Vitamin D deficiency, unspecified: Secondary | ICD-10-CM | POA: Diagnosis not present

## 2021-09-08 DIAGNOSIS — M86151 Other acute osteomyelitis, right femur: Secondary | ICD-10-CM | POA: Diagnosis not present

## 2021-09-08 DIAGNOSIS — Z780 Asymptomatic menopausal state: Secondary | ICD-10-CM | POA: Diagnosis not present

## 2021-09-08 DIAGNOSIS — R000 Tachycardia, unspecified: Secondary | ICD-10-CM | POA: Diagnosis not present

## 2021-09-08 DIAGNOSIS — M869 Osteomyelitis, unspecified: Secondary | ICD-10-CM | POA: Diagnosis not present

## 2021-09-08 DIAGNOSIS — T8451XA Infection and inflammatory reaction due to internal right hip prosthesis, initial encounter: Secondary | ICD-10-CM | POA: Diagnosis not present

## 2021-09-08 DIAGNOSIS — S90852A Superficial foreign body, left foot, initial encounter: Secondary | ICD-10-CM | POA: Diagnosis not present

## 2021-09-08 DIAGNOSIS — N319 Neuromuscular dysfunction of bladder, unspecified: Secondary | ICD-10-CM | POA: Diagnosis not present

## 2021-09-08 DIAGNOSIS — D649 Anemia, unspecified: Secondary | ICD-10-CM | POA: Diagnosis not present

## 2021-09-08 DIAGNOSIS — B9561 Methicillin susceptible Staphylococcus aureus infection as the cause of diseases classified elsewhere: Secondary | ICD-10-CM | POA: Diagnosis not present

## 2021-09-08 DIAGNOSIS — I081 Rheumatic disorders of both mitral and tricuspid valves: Secondary | ICD-10-CM | POA: Diagnosis not present

## 2021-09-08 DIAGNOSIS — Z792 Long term (current) use of antibiotics: Secondary | ICD-10-CM | POA: Diagnosis not present

## 2021-09-08 DIAGNOSIS — M1712 Unilateral primary osteoarthritis, left knee: Secondary | ICD-10-CM | POA: Diagnosis not present

## 2021-09-08 DIAGNOSIS — I959 Hypotension, unspecified: Secondary | ICD-10-CM | POA: Diagnosis not present

## 2021-09-08 DIAGNOSIS — L89309 Pressure ulcer of unspecified buttock, unspecified stage: Secondary | ICD-10-CM | POA: Diagnosis not present

## 2021-09-08 DIAGNOSIS — L89624 Pressure ulcer of left heel, stage 4: Secondary | ICD-10-CM | POA: Diagnosis not present

## 2021-09-08 DIAGNOSIS — M810 Age-related osteoporosis without current pathological fracture: Secondary | ICD-10-CM | POA: Diagnosis not present

## 2021-09-08 DIAGNOSIS — E43 Unspecified severe protein-calorie malnutrition: Secondary | ICD-10-CM | POA: Diagnosis not present

## 2021-09-08 DIAGNOSIS — M8588 Other specified disorders of bone density and structure, other site: Secondary | ICD-10-CM | POA: Diagnosis not present

## 2021-09-08 DIAGNOSIS — M86672 Other chronic osteomyelitis, left ankle and foot: Secondary | ICD-10-CM | POA: Diagnosis not present

## 2021-09-08 DIAGNOSIS — I499 Cardiac arrhythmia, unspecified: Secondary | ICD-10-CM | POA: Diagnosis not present

## 2021-09-08 DIAGNOSIS — L89224 Pressure ulcer of left hip, stage 4: Secondary | ICD-10-CM | POA: Diagnosis not present

## 2021-09-08 DIAGNOSIS — B955 Unspecified streptococcus as the cause of diseases classified elsewhere: Secondary | ICD-10-CM | POA: Diagnosis not present

## 2021-09-08 DIAGNOSIS — T84124A Displacement of internal fixation device of right femur, initial encounter: Secondary | ICD-10-CM | POA: Diagnosis not present

## 2021-09-08 DIAGNOSIS — Z471 Aftercare following joint replacement surgery: Secondary | ICD-10-CM | POA: Diagnosis not present

## 2021-09-08 DIAGNOSIS — Z20822 Contact with and (suspected) exposure to covid-19: Secondary | ICD-10-CM | POA: Diagnosis not present

## 2021-09-08 DIAGNOSIS — S72001A Fracture of unspecified part of neck of right femur, initial encounter for closed fracture: Secondary | ICD-10-CM | POA: Diagnosis not present

## 2021-09-08 DIAGNOSIS — L89153 Pressure ulcer of sacral region, stage 3: Secondary | ICD-10-CM | POA: Diagnosis not present

## 2021-09-08 DIAGNOSIS — R9341 Abnormal radiologic findings on diagnostic imaging of renal pelvis, ureter, or bladder: Secondary | ICD-10-CM | POA: Diagnosis not present

## 2021-09-08 DIAGNOSIS — M898X5 Other specified disorders of bone, thigh: Secondary | ICD-10-CM | POA: Diagnosis not present

## 2021-09-08 DIAGNOSIS — M4716 Other spondylosis with myelopathy, lumbar region: Secondary | ICD-10-CM | POA: Diagnosis not present

## 2021-09-08 DIAGNOSIS — T84620A Infection and inflammatory reaction due to internal fixation device of right femur, initial encounter: Secondary | ICD-10-CM | POA: Diagnosis not present

## 2021-09-08 DIAGNOSIS — Z743 Need for continuous supervision: Secondary | ICD-10-CM | POA: Diagnosis not present

## 2021-09-08 DIAGNOSIS — F17210 Nicotine dependence, cigarettes, uncomplicated: Secondary | ICD-10-CM | POA: Diagnosis not present

## 2021-09-08 DIAGNOSIS — Z720 Tobacco use: Secondary | ICD-10-CM | POA: Diagnosis not present

## 2021-09-08 DIAGNOSIS — T84090A Other mechanical complication of internal right hip prosthesis, initial encounter: Secondary | ICD-10-CM | POA: Diagnosis not present

## 2021-09-08 DIAGNOSIS — R404 Transient alteration of awareness: Secondary | ICD-10-CM | POA: Diagnosis not present

## 2021-09-08 DIAGNOSIS — E878 Other disorders of electrolyte and fluid balance, not elsewhere classified: Secondary | ICD-10-CM | POA: Diagnosis not present

## 2021-09-08 DIAGNOSIS — M86651 Other chronic osteomyelitis, right thigh: Secondary | ICD-10-CM | POA: Diagnosis not present

## 2021-09-08 DIAGNOSIS — L89319 Pressure ulcer of right buttock, unspecified stage: Secondary | ICD-10-CM | POA: Diagnosis not present

## 2021-09-08 DIAGNOSIS — A419 Sepsis, unspecified organism: Secondary | ICD-10-CM | POA: Diagnosis not present

## 2021-09-08 DIAGNOSIS — R791 Abnormal coagulation profile: Secondary | ICD-10-CM | POA: Diagnosis not present

## 2021-09-08 DIAGNOSIS — Z452 Encounter for adjustment and management of vascular access device: Secondary | ICD-10-CM | POA: Diagnosis not present

## 2021-09-08 DIAGNOSIS — B965 Pseudomonas (aeruginosa) (mallei) (pseudomallei) as the cause of diseases classified elsewhere: Secondary | ICD-10-CM | POA: Diagnosis not present

## 2021-09-08 DIAGNOSIS — T8450XA Infection and inflammatory reaction due to unspecified internal joint prosthesis, initial encounter: Secondary | ICD-10-CM | POA: Diagnosis not present

## 2021-09-08 DIAGNOSIS — L89154 Pressure ulcer of sacral region, stage 4: Secondary | ICD-10-CM | POA: Diagnosis not present

## 2021-09-08 DIAGNOSIS — L89324 Pressure ulcer of left buttock, stage 4: Secondary | ICD-10-CM | POA: Diagnosis not present

## 2021-09-08 DIAGNOSIS — B9689 Other specified bacterial agents as the cause of diseases classified elsewhere: Secondary | ICD-10-CM | POA: Diagnosis not present

## 2021-09-08 DIAGNOSIS — R140 Abdominal distension (gaseous): Secondary | ICD-10-CM | POA: Diagnosis not present

## 2021-09-08 DIAGNOSIS — M868X8 Other osteomyelitis, other site: Secondary | ICD-10-CM | POA: Diagnosis not present

## 2021-09-08 DIAGNOSIS — E871 Hypo-osmolality and hyponatremia: Secondary | ICD-10-CM | POA: Diagnosis not present

## 2021-09-08 DIAGNOSIS — R0902 Hypoxemia: Secondary | ICD-10-CM | POA: Diagnosis not present

## 2021-09-08 DIAGNOSIS — M86671 Other chronic osteomyelitis, right ankle and foot: Secondary | ICD-10-CM | POA: Diagnosis not present

## 2021-09-08 DIAGNOSIS — R918 Other nonspecific abnormal finding of lung field: Secondary | ICD-10-CM | POA: Diagnosis not present

## 2021-09-08 DIAGNOSIS — D62 Acute posthemorrhagic anemia: Secondary | ICD-10-CM | POA: Diagnosis not present

## 2021-09-08 DIAGNOSIS — M86272 Subacute osteomyelitis, left ankle and foot: Secondary | ICD-10-CM | POA: Diagnosis not present

## 2021-09-08 DIAGNOSIS — G8222 Paraplegia, incomplete: Secondary | ICD-10-CM | POA: Diagnosis not present

## 2021-09-08 DIAGNOSIS — F17290 Nicotine dependence, other tobacco product, uncomplicated: Secondary | ICD-10-CM | POA: Diagnosis not present

## 2021-09-08 DIAGNOSIS — Z96651 Presence of right artificial knee joint: Secondary | ICD-10-CM | POA: Diagnosis not present

## 2021-09-08 DIAGNOSIS — Z791 Long term (current) use of non-steroidal anti-inflammatories (NSAID): Secondary | ICD-10-CM | POA: Diagnosis not present

## 2021-09-08 DIAGNOSIS — M009 Pyogenic arthritis, unspecified: Secondary | ICD-10-CM | POA: Diagnosis not present

## 2021-09-08 DIAGNOSIS — M868X7 Other osteomyelitis, ankle and foot: Secondary | ICD-10-CM | POA: Diagnosis not present

## 2021-09-08 DIAGNOSIS — R6889 Other general symptoms and signs: Secondary | ICD-10-CM | POA: Diagnosis not present

## 2021-09-19 NOTE — Telephone Encounter (Signed)
Patient notified - she is currently admitted at [facility]

## 2021-09-23 DIAGNOSIS — T8450XA Infection and inflammatory reaction due to unspecified internal joint prosthesis, initial encounter: Secondary | ICD-10-CM | POA: Diagnosis not present

## 2021-09-24 DIAGNOSIS — I959 Hypotension, unspecified: Secondary | ICD-10-CM | POA: Diagnosis not present

## 2021-09-24 DIAGNOSIS — G8220 Paraplegia, unspecified: Secondary | ICD-10-CM | POA: Diagnosis not present

## 2021-09-24 DIAGNOSIS — L89224 Pressure ulcer of left hip, stage 4: Secondary | ICD-10-CM | POA: Diagnosis not present

## 2021-09-24 DIAGNOSIS — L89624 Pressure ulcer of left heel, stage 4: Secondary | ICD-10-CM | POA: Diagnosis not present

## 2021-09-24 DIAGNOSIS — E43 Unspecified severe protein-calorie malnutrition: Secondary | ICD-10-CM | POA: Diagnosis not present

## 2021-09-24 DIAGNOSIS — M86451 Chronic osteomyelitis with draining sinus, right femur: Secondary | ICD-10-CM | POA: Diagnosis not present

## 2021-09-24 DIAGNOSIS — L89324 Pressure ulcer of left buttock, stage 4: Secondary | ICD-10-CM | POA: Diagnosis not present

## 2021-09-24 DIAGNOSIS — D638 Anemia in other chronic diseases classified elsewhere: Secondary | ICD-10-CM | POA: Diagnosis not present

## 2021-09-24 DIAGNOSIS — L89214 Pressure ulcer of right hip, stage 4: Secondary | ICD-10-CM | POA: Diagnosis not present

## 2021-09-25 DIAGNOSIS — L89624 Pressure ulcer of left heel, stage 4: Secondary | ICD-10-CM | POA: Diagnosis not present

## 2021-09-25 DIAGNOSIS — M4716 Other spondylosis with myelopathy, lumbar region: Secondary | ICD-10-CM | POA: Diagnosis not present

## 2021-09-25 DIAGNOSIS — S39012D Strain of muscle, fascia and tendon of lower back, subsequent encounter: Secondary | ICD-10-CM | POA: Diagnosis not present

## 2021-09-25 DIAGNOSIS — L8944 Pressure ulcer of contiguous site of back, buttock and hip, stage 4: Secondary | ICD-10-CM | POA: Diagnosis not present

## 2021-09-26 DIAGNOSIS — T8131XA Disruption of external operation (surgical) wound, not elsewhere classified, initial encounter: Secondary | ICD-10-CM | POA: Diagnosis not present

## 2021-09-26 DIAGNOSIS — I959 Hypotension, unspecified: Secondary | ICD-10-CM | POA: Diagnosis not present

## 2021-09-26 DIAGNOSIS — L89224 Pressure ulcer of left hip, stage 4: Secondary | ICD-10-CM | POA: Diagnosis not present

## 2021-09-26 DIAGNOSIS — E43 Unspecified severe protein-calorie malnutrition: Secondary | ICD-10-CM | POA: Diagnosis not present

## 2021-09-26 DIAGNOSIS — M86451 Chronic osteomyelitis with draining sinus, right femur: Secondary | ICD-10-CM | POA: Diagnosis not present

## 2021-09-26 DIAGNOSIS — L89214 Pressure ulcer of right hip, stage 4: Secondary | ICD-10-CM | POA: Diagnosis not present

## 2021-09-26 DIAGNOSIS — L89624 Pressure ulcer of left heel, stage 4: Secondary | ICD-10-CM | POA: Diagnosis not present

## 2021-09-26 DIAGNOSIS — D638 Anemia in other chronic diseases classified elsewhere: Secondary | ICD-10-CM | POA: Diagnosis not present

## 2021-09-26 DIAGNOSIS — G8220 Paraplegia, unspecified: Secondary | ICD-10-CM | POA: Diagnosis not present

## 2021-09-26 DIAGNOSIS — L89324 Pressure ulcer of left buttock, stage 4: Secondary | ICD-10-CM | POA: Diagnosis not present

## 2021-09-29 DIAGNOSIS — G8220 Paraplegia, unspecified: Secondary | ICD-10-CM | POA: Diagnosis not present

## 2021-09-29 DIAGNOSIS — I959 Hypotension, unspecified: Secondary | ICD-10-CM | POA: Diagnosis not present

## 2021-09-29 DIAGNOSIS — D638 Anemia in other chronic diseases classified elsewhere: Secondary | ICD-10-CM | POA: Diagnosis not present

## 2021-09-29 DIAGNOSIS — L89224 Pressure ulcer of left hip, stage 4: Secondary | ICD-10-CM | POA: Diagnosis not present

## 2021-09-29 DIAGNOSIS — L89624 Pressure ulcer of left heel, stage 4: Secondary | ICD-10-CM | POA: Diagnosis not present

## 2021-09-29 DIAGNOSIS — E43 Unspecified severe protein-calorie malnutrition: Secondary | ICD-10-CM | POA: Diagnosis not present

## 2021-09-29 DIAGNOSIS — L89214 Pressure ulcer of right hip, stage 4: Secondary | ICD-10-CM | POA: Diagnosis not present

## 2021-09-29 DIAGNOSIS — M86451 Chronic osteomyelitis with draining sinus, right femur: Secondary | ICD-10-CM | POA: Diagnosis not present

## 2021-09-29 DIAGNOSIS — L89324 Pressure ulcer of left buttock, stage 4: Secondary | ICD-10-CM | POA: Diagnosis not present

## 2021-09-30 DIAGNOSIS — I959 Hypotension, unspecified: Secondary | ICD-10-CM | POA: Diagnosis not present

## 2021-09-30 DIAGNOSIS — D638 Anemia in other chronic diseases classified elsewhere: Secondary | ICD-10-CM | POA: Diagnosis not present

## 2021-09-30 DIAGNOSIS — L89224 Pressure ulcer of left hip, stage 4: Secondary | ICD-10-CM | POA: Diagnosis not present

## 2021-09-30 DIAGNOSIS — M86451 Chronic osteomyelitis with draining sinus, right femur: Secondary | ICD-10-CM | POA: Diagnosis not present

## 2021-09-30 DIAGNOSIS — E43 Unspecified severe protein-calorie malnutrition: Secondary | ICD-10-CM | POA: Diagnosis not present

## 2021-09-30 DIAGNOSIS — L89214 Pressure ulcer of right hip, stage 4: Secondary | ICD-10-CM | POA: Diagnosis not present

## 2021-09-30 DIAGNOSIS — L89324 Pressure ulcer of left buttock, stage 4: Secondary | ICD-10-CM | POA: Diagnosis not present

## 2021-09-30 DIAGNOSIS — L89624 Pressure ulcer of left heel, stage 4: Secondary | ICD-10-CM | POA: Diagnosis not present

## 2021-09-30 DIAGNOSIS — G8220 Paraplegia, unspecified: Secondary | ICD-10-CM | POA: Diagnosis not present

## 2021-10-01 DIAGNOSIS — T8450XA Infection and inflammatory reaction due to unspecified internal joint prosthesis, initial encounter: Secondary | ICD-10-CM | POA: Diagnosis not present

## 2021-10-01 DIAGNOSIS — Z03818 Encounter for observation for suspected exposure to other biological agents ruled out: Secondary | ICD-10-CM | POA: Diagnosis not present

## 2021-10-02 DIAGNOSIS — L89214 Pressure ulcer of right hip, stage 4: Secondary | ICD-10-CM | POA: Diagnosis not present

## 2021-10-02 DIAGNOSIS — L89614 Pressure ulcer of right heel, stage 4: Secondary | ICD-10-CM | POA: Diagnosis not present

## 2021-10-02 DIAGNOSIS — L89314 Pressure ulcer of right buttock, stage 4: Secondary | ICD-10-CM | POA: Diagnosis not present

## 2021-10-02 DIAGNOSIS — L97912 Non-pressure chronic ulcer of unspecified part of right lower leg with fat layer exposed: Secondary | ICD-10-CM | POA: Diagnosis not present

## 2021-10-02 DIAGNOSIS — L89624 Pressure ulcer of left heel, stage 4: Secondary | ICD-10-CM | POA: Diagnosis not present

## 2021-10-02 DIAGNOSIS — E46 Unspecified protein-calorie malnutrition: Secondary | ICD-10-CM | POA: Diagnosis not present

## 2021-10-02 DIAGNOSIS — L89224 Pressure ulcer of left hip, stage 4: Secondary | ICD-10-CM | POA: Diagnosis not present

## 2021-10-02 DIAGNOSIS — Z792 Long term (current) use of antibiotics: Secondary | ICD-10-CM | POA: Diagnosis not present

## 2021-10-02 DIAGNOSIS — D649 Anemia, unspecified: Secondary | ICD-10-CM | POA: Diagnosis not present

## 2021-10-02 DIAGNOSIS — L89324 Pressure ulcer of left buttock, stage 4: Secondary | ICD-10-CM | POA: Diagnosis not present

## 2021-10-03 DIAGNOSIS — T8451XD Infection and inflammatory reaction due to internal right hip prosthesis, subsequent encounter: Secondary | ICD-10-CM | POA: Diagnosis not present

## 2021-10-03 DIAGNOSIS — M86451 Chronic osteomyelitis with draining sinus, right femur: Secondary | ICD-10-CM | POA: Diagnosis not present

## 2021-10-03 DIAGNOSIS — L89224 Pressure ulcer of left hip, stage 4: Secondary | ICD-10-CM | POA: Diagnosis not present

## 2021-10-03 DIAGNOSIS — L89624 Pressure ulcer of left heel, stage 4: Secondary | ICD-10-CM | POA: Diagnosis not present

## 2021-10-03 DIAGNOSIS — D638 Anemia in other chronic diseases classified elsewhere: Secondary | ICD-10-CM | POA: Diagnosis not present

## 2021-10-03 DIAGNOSIS — I959 Hypotension, unspecified: Secondary | ICD-10-CM | POA: Diagnosis not present

## 2021-10-03 DIAGNOSIS — L89214 Pressure ulcer of right hip, stage 4: Secondary | ICD-10-CM | POA: Diagnosis not present

## 2021-10-03 DIAGNOSIS — L89324 Pressure ulcer of left buttock, stage 4: Secondary | ICD-10-CM | POA: Diagnosis not present

## 2021-10-03 DIAGNOSIS — E43 Unspecified severe protein-calorie malnutrition: Secondary | ICD-10-CM | POA: Diagnosis not present

## 2021-10-03 DIAGNOSIS — G8220 Paraplegia, unspecified: Secondary | ICD-10-CM | POA: Diagnosis not present

## 2021-10-04 DIAGNOSIS — D638 Anemia in other chronic diseases classified elsewhere: Secondary | ICD-10-CM | POA: Diagnosis not present

## 2021-10-04 DIAGNOSIS — L89224 Pressure ulcer of left hip, stage 4: Secondary | ICD-10-CM | POA: Diagnosis not present

## 2021-10-04 DIAGNOSIS — L89324 Pressure ulcer of left buttock, stage 4: Secondary | ICD-10-CM | POA: Diagnosis not present

## 2021-10-04 DIAGNOSIS — G8220 Paraplegia, unspecified: Secondary | ICD-10-CM | POA: Diagnosis not present

## 2021-10-04 DIAGNOSIS — L89624 Pressure ulcer of left heel, stage 4: Secondary | ICD-10-CM | POA: Diagnosis not present

## 2021-10-04 DIAGNOSIS — M86451 Chronic osteomyelitis with draining sinus, right femur: Secondary | ICD-10-CM | POA: Diagnosis not present

## 2021-10-04 DIAGNOSIS — I959 Hypotension, unspecified: Secondary | ICD-10-CM | POA: Diagnosis not present

## 2021-10-04 DIAGNOSIS — L89214 Pressure ulcer of right hip, stage 4: Secondary | ICD-10-CM | POA: Diagnosis not present

## 2021-10-04 DIAGNOSIS — E43 Unspecified severe protein-calorie malnutrition: Secondary | ICD-10-CM | POA: Diagnosis not present

## 2021-10-05 DIAGNOSIS — L89224 Pressure ulcer of left hip, stage 4: Secondary | ICD-10-CM | POA: Diagnosis not present

## 2021-10-05 DIAGNOSIS — E43 Unspecified severe protein-calorie malnutrition: Secondary | ICD-10-CM | POA: Diagnosis not present

## 2021-10-05 DIAGNOSIS — M86451 Chronic osteomyelitis with draining sinus, right femur: Secondary | ICD-10-CM | POA: Diagnosis not present

## 2021-10-05 DIAGNOSIS — I959 Hypotension, unspecified: Secondary | ICD-10-CM | POA: Diagnosis not present

## 2021-10-05 DIAGNOSIS — G8220 Paraplegia, unspecified: Secondary | ICD-10-CM | POA: Diagnosis not present

## 2021-10-05 DIAGNOSIS — L89214 Pressure ulcer of right hip, stage 4: Secondary | ICD-10-CM | POA: Diagnosis not present

## 2021-10-05 DIAGNOSIS — L89324 Pressure ulcer of left buttock, stage 4: Secondary | ICD-10-CM | POA: Diagnosis not present

## 2021-10-05 DIAGNOSIS — D638 Anemia in other chronic diseases classified elsewhere: Secondary | ICD-10-CM | POA: Diagnosis not present

## 2021-10-05 DIAGNOSIS — L89624 Pressure ulcer of left heel, stage 4: Secondary | ICD-10-CM | POA: Diagnosis not present

## 2021-10-06 DIAGNOSIS — M86451 Chronic osteomyelitis with draining sinus, right femur: Secondary | ICD-10-CM | POA: Diagnosis not present

## 2021-10-06 DIAGNOSIS — L89324 Pressure ulcer of left buttock, stage 4: Secondary | ICD-10-CM | POA: Diagnosis not present

## 2021-10-06 DIAGNOSIS — A419 Sepsis, unspecified organism: Secondary | ICD-10-CM | POA: Diagnosis not present

## 2021-10-06 DIAGNOSIS — R339 Retention of urine, unspecified: Secondary | ICD-10-CM | POA: Diagnosis not present

## 2021-10-06 DIAGNOSIS — I959 Hypotension, unspecified: Secondary | ICD-10-CM | POA: Diagnosis not present

## 2021-10-06 DIAGNOSIS — L89624 Pressure ulcer of left heel, stage 4: Secondary | ICD-10-CM | POA: Diagnosis not present

## 2021-10-06 DIAGNOSIS — M869 Osteomyelitis, unspecified: Secondary | ICD-10-CM | POA: Diagnosis not present

## 2021-10-06 DIAGNOSIS — G8220 Paraplegia, unspecified: Secondary | ICD-10-CM | POA: Diagnosis not present

## 2021-10-06 DIAGNOSIS — L89214 Pressure ulcer of right hip, stage 4: Secondary | ICD-10-CM | POA: Diagnosis not present

## 2021-10-06 DIAGNOSIS — D638 Anemia in other chronic diseases classified elsewhere: Secondary | ICD-10-CM | POA: Diagnosis not present

## 2021-10-06 DIAGNOSIS — E43 Unspecified severe protein-calorie malnutrition: Secondary | ICD-10-CM | POA: Diagnosis not present

## 2021-10-06 DIAGNOSIS — L89224 Pressure ulcer of left hip, stage 4: Secondary | ICD-10-CM | POA: Diagnosis not present

## 2021-10-08 ENCOUNTER — Other Ambulatory Visit: Payer: Self-pay

## 2021-10-08 ENCOUNTER — Telehealth: Payer: Medicare Other | Admitting: Internal Medicine

## 2021-10-08 DIAGNOSIS — E43 Unspecified severe protein-calorie malnutrition: Secondary | ICD-10-CM | POA: Diagnosis not present

## 2021-10-08 DIAGNOSIS — G8220 Paraplegia, unspecified: Secondary | ICD-10-CM | POA: Diagnosis not present

## 2021-10-08 DIAGNOSIS — L89324 Pressure ulcer of left buttock, stage 4: Secondary | ICD-10-CM | POA: Diagnosis not present

## 2021-10-08 DIAGNOSIS — D638 Anemia in other chronic diseases classified elsewhere: Secondary | ICD-10-CM | POA: Diagnosis not present

## 2021-10-08 DIAGNOSIS — L89224 Pressure ulcer of left hip, stage 4: Secondary | ICD-10-CM | POA: Diagnosis not present

## 2021-10-08 DIAGNOSIS — T847XXD Infection and inflammatory reaction due to other internal orthopedic prosthetic devices, implants and grafts, subsequent encounter: Secondary | ICD-10-CM

## 2021-10-08 DIAGNOSIS — I959 Hypotension, unspecified: Secondary | ICD-10-CM | POA: Diagnosis not present

## 2021-10-08 DIAGNOSIS — M8660 Other chronic osteomyelitis, unspecified site: Secondary | ICD-10-CM

## 2021-10-08 DIAGNOSIS — L89624 Pressure ulcer of left heel, stage 4: Secondary | ICD-10-CM | POA: Diagnosis not present

## 2021-10-08 DIAGNOSIS — L89214 Pressure ulcer of right hip, stage 4: Secondary | ICD-10-CM | POA: Diagnosis not present

## 2021-10-08 DIAGNOSIS — M86451 Chronic osteomyelitis with draining sinus, right femur: Secondary | ICD-10-CM | POA: Diagnosis not present

## 2021-10-09 ENCOUNTER — Telehealth: Payer: Self-pay | Admitting: Family

## 2021-10-09 DIAGNOSIS — M86451 Chronic osteomyelitis with draining sinus, right femur: Secondary | ICD-10-CM | POA: Diagnosis not present

## 2021-10-09 DIAGNOSIS — L89624 Pressure ulcer of left heel, stage 4: Secondary | ICD-10-CM | POA: Diagnosis not present

## 2021-10-09 DIAGNOSIS — L89324 Pressure ulcer of left buttock, stage 4: Secondary | ICD-10-CM | POA: Diagnosis not present

## 2021-10-09 DIAGNOSIS — D638 Anemia in other chronic diseases classified elsewhere: Secondary | ICD-10-CM | POA: Diagnosis not present

## 2021-10-09 DIAGNOSIS — T8450XA Infection and inflammatory reaction due to unspecified internal joint prosthesis, initial encounter: Secondary | ICD-10-CM | POA: Diagnosis not present

## 2021-10-09 DIAGNOSIS — L89214 Pressure ulcer of right hip, stage 4: Secondary | ICD-10-CM | POA: Diagnosis not present

## 2021-10-09 DIAGNOSIS — E43 Unspecified severe protein-calorie malnutrition: Secondary | ICD-10-CM | POA: Diagnosis not present

## 2021-10-09 DIAGNOSIS — L89224 Pressure ulcer of left hip, stage 4: Secondary | ICD-10-CM | POA: Diagnosis not present

## 2021-10-09 DIAGNOSIS — I959 Hypotension, unspecified: Secondary | ICD-10-CM | POA: Diagnosis not present

## 2021-10-09 DIAGNOSIS — G8220 Paraplegia, unspecified: Secondary | ICD-10-CM | POA: Diagnosis not present

## 2021-10-09 NOTE — Telephone Encounter (Signed)
Spoke with patient, appointment scheduled 10/23/21

## 2021-10-10 ENCOUNTER — Ambulatory Visit: Payer: Medicare Other | Admitting: Family

## 2021-10-10 DIAGNOSIS — E43 Unspecified severe protein-calorie malnutrition: Secondary | ICD-10-CM | POA: Diagnosis not present

## 2021-10-10 DIAGNOSIS — M86451 Chronic osteomyelitis with draining sinus, right femur: Secondary | ICD-10-CM | POA: Diagnosis not present

## 2021-10-10 DIAGNOSIS — I959 Hypotension, unspecified: Secondary | ICD-10-CM | POA: Diagnosis not present

## 2021-10-10 DIAGNOSIS — L89214 Pressure ulcer of right hip, stage 4: Secondary | ICD-10-CM | POA: Diagnosis not present

## 2021-10-10 DIAGNOSIS — G8220 Paraplegia, unspecified: Secondary | ICD-10-CM | POA: Diagnosis not present

## 2021-10-10 DIAGNOSIS — L89224 Pressure ulcer of left hip, stage 4: Secondary | ICD-10-CM | POA: Diagnosis not present

## 2021-10-10 DIAGNOSIS — L89624 Pressure ulcer of left heel, stage 4: Secondary | ICD-10-CM | POA: Diagnosis not present

## 2021-10-10 DIAGNOSIS — L89324 Pressure ulcer of left buttock, stage 4: Secondary | ICD-10-CM | POA: Diagnosis not present

## 2021-10-10 DIAGNOSIS — D638 Anemia in other chronic diseases classified elsewhere: Secondary | ICD-10-CM | POA: Diagnosis not present

## 2021-10-12 DIAGNOSIS — L89324 Pressure ulcer of left buttock, stage 4: Secondary | ICD-10-CM | POA: Diagnosis not present

## 2021-10-12 DIAGNOSIS — D638 Anemia in other chronic diseases classified elsewhere: Secondary | ICD-10-CM | POA: Diagnosis not present

## 2021-10-12 DIAGNOSIS — L89224 Pressure ulcer of left hip, stage 4: Secondary | ICD-10-CM | POA: Diagnosis not present

## 2021-10-12 DIAGNOSIS — L89214 Pressure ulcer of right hip, stage 4: Secondary | ICD-10-CM | POA: Diagnosis not present

## 2021-10-12 DIAGNOSIS — L89624 Pressure ulcer of left heel, stage 4: Secondary | ICD-10-CM | POA: Diagnosis not present

## 2021-10-12 DIAGNOSIS — E43 Unspecified severe protein-calorie malnutrition: Secondary | ICD-10-CM | POA: Diagnosis not present

## 2021-10-12 DIAGNOSIS — M86451 Chronic osteomyelitis with draining sinus, right femur: Secondary | ICD-10-CM | POA: Diagnosis not present

## 2021-10-12 DIAGNOSIS — I959 Hypotension, unspecified: Secondary | ICD-10-CM | POA: Diagnosis not present

## 2021-10-12 DIAGNOSIS — G8220 Paraplegia, unspecified: Secondary | ICD-10-CM | POA: Diagnosis not present

## 2021-10-13 ENCOUNTER — Ambulatory Visit: Payer: Medicare Other | Admitting: Family

## 2021-10-13 DIAGNOSIS — L89624 Pressure ulcer of left heel, stage 4: Secondary | ICD-10-CM | POA: Diagnosis not present

## 2021-10-13 DIAGNOSIS — E43 Unspecified severe protein-calorie malnutrition: Secondary | ICD-10-CM | POA: Diagnosis not present

## 2021-10-13 DIAGNOSIS — M86451 Chronic osteomyelitis with draining sinus, right femur: Secondary | ICD-10-CM | POA: Diagnosis not present

## 2021-10-13 DIAGNOSIS — L89214 Pressure ulcer of right hip, stage 4: Secondary | ICD-10-CM | POA: Diagnosis not present

## 2021-10-13 DIAGNOSIS — L89324 Pressure ulcer of left buttock, stage 4: Secondary | ICD-10-CM | POA: Diagnosis not present

## 2021-10-13 DIAGNOSIS — M869 Osteomyelitis, unspecified: Secondary | ICD-10-CM | POA: Diagnosis not present

## 2021-10-13 DIAGNOSIS — I959 Hypotension, unspecified: Secondary | ICD-10-CM | POA: Diagnosis not present

## 2021-10-13 DIAGNOSIS — A419 Sepsis, unspecified organism: Secondary | ICD-10-CM | POA: Diagnosis not present

## 2021-10-13 DIAGNOSIS — L89224 Pressure ulcer of left hip, stage 4: Secondary | ICD-10-CM | POA: Diagnosis not present

## 2021-10-13 DIAGNOSIS — G8220 Paraplegia, unspecified: Secondary | ICD-10-CM | POA: Diagnosis not present

## 2021-10-13 DIAGNOSIS — D638 Anemia in other chronic diseases classified elsewhere: Secondary | ICD-10-CM | POA: Diagnosis not present

## 2021-10-14 DIAGNOSIS — N319 Neuromuscular dysfunction of bladder, unspecified: Secondary | ICD-10-CM | POA: Diagnosis not present

## 2021-10-14 DIAGNOSIS — R6521 Severe sepsis with septic shock: Secondary | ICD-10-CM | POA: Diagnosis not present

## 2021-10-14 DIAGNOSIS — T8451XD Infection and inflammatory reaction due to internal right hip prosthesis, subsequent encounter: Secondary | ICD-10-CM | POA: Diagnosis not present

## 2021-10-14 DIAGNOSIS — M86172 Other acute osteomyelitis, left ankle and foot: Secondary | ICD-10-CM | POA: Diagnosis not present

## 2021-10-14 DIAGNOSIS — Z993 Dependence on wheelchair: Secondary | ICD-10-CM | POA: Diagnosis not present

## 2021-10-14 DIAGNOSIS — L89224 Pressure ulcer of left hip, stage 4: Secondary | ICD-10-CM | POA: Diagnosis not present

## 2021-10-14 DIAGNOSIS — T8131XA Disruption of external operation (surgical) wound, not elsewhere classified, initial encounter: Secondary | ICD-10-CM | POA: Diagnosis not present

## 2021-10-14 DIAGNOSIS — T8459XD Infection and inflammatory reaction due to other internal joint prosthesis, subsequent encounter: Secondary | ICD-10-CM | POA: Diagnosis not present

## 2021-10-14 DIAGNOSIS — M25551 Pain in right hip: Secondary | ICD-10-CM | POA: Diagnosis not present

## 2021-10-14 DIAGNOSIS — Z792 Long term (current) use of antibiotics: Secondary | ICD-10-CM | POA: Diagnosis not present

## 2021-10-14 DIAGNOSIS — L89624 Pressure ulcer of left heel, stage 4: Secondary | ICD-10-CM | POA: Diagnosis not present

## 2021-10-14 DIAGNOSIS — Z9889 Other specified postprocedural states: Secondary | ICD-10-CM | POA: Diagnosis not present

## 2021-10-14 DIAGNOSIS — Z96649 Presence of unspecified artificial hip joint: Secondary | ICD-10-CM | POA: Diagnosis not present

## 2021-10-15 DIAGNOSIS — L89624 Pressure ulcer of left heel, stage 4: Secondary | ICD-10-CM | POA: Diagnosis not present

## 2021-10-15 DIAGNOSIS — I959 Hypotension, unspecified: Secondary | ICD-10-CM | POA: Diagnosis not present

## 2021-10-15 DIAGNOSIS — L89324 Pressure ulcer of left buttock, stage 4: Secondary | ICD-10-CM | POA: Diagnosis not present

## 2021-10-15 DIAGNOSIS — M86451 Chronic osteomyelitis with draining sinus, right femur: Secondary | ICD-10-CM | POA: Diagnosis not present

## 2021-10-15 DIAGNOSIS — E43 Unspecified severe protein-calorie malnutrition: Secondary | ICD-10-CM | POA: Diagnosis not present

## 2021-10-15 DIAGNOSIS — L89214 Pressure ulcer of right hip, stage 4: Secondary | ICD-10-CM | POA: Diagnosis not present

## 2021-10-15 DIAGNOSIS — D638 Anemia in other chronic diseases classified elsewhere: Secondary | ICD-10-CM | POA: Diagnosis not present

## 2021-10-15 DIAGNOSIS — G8220 Paraplegia, unspecified: Secondary | ICD-10-CM | POA: Diagnosis not present

## 2021-10-15 DIAGNOSIS — L89224 Pressure ulcer of left hip, stage 4: Secondary | ICD-10-CM | POA: Diagnosis not present

## 2021-10-16 DIAGNOSIS — D638 Anemia in other chronic diseases classified elsewhere: Secondary | ICD-10-CM | POA: Diagnosis not present

## 2021-10-16 DIAGNOSIS — M86451 Chronic osteomyelitis with draining sinus, right femur: Secondary | ICD-10-CM | POA: Diagnosis not present

## 2021-10-16 DIAGNOSIS — E43 Unspecified severe protein-calorie malnutrition: Secondary | ICD-10-CM | POA: Diagnosis not present

## 2021-10-16 DIAGNOSIS — L89224 Pressure ulcer of left hip, stage 4: Secondary | ICD-10-CM | POA: Diagnosis not present

## 2021-10-16 DIAGNOSIS — L89214 Pressure ulcer of right hip, stage 4: Secondary | ICD-10-CM | POA: Diagnosis not present

## 2021-10-16 DIAGNOSIS — I959 Hypotension, unspecified: Secondary | ICD-10-CM | POA: Diagnosis not present

## 2021-10-16 DIAGNOSIS — L89324 Pressure ulcer of left buttock, stage 4: Secondary | ICD-10-CM | POA: Diagnosis not present

## 2021-10-16 DIAGNOSIS — L89624 Pressure ulcer of left heel, stage 4: Secondary | ICD-10-CM | POA: Diagnosis not present

## 2021-10-16 DIAGNOSIS — G8220 Paraplegia, unspecified: Secondary | ICD-10-CM | POA: Diagnosis not present

## 2021-10-17 DIAGNOSIS — T8450XA Infection and inflammatory reaction due to unspecified internal joint prosthesis, initial encounter: Secondary | ICD-10-CM | POA: Diagnosis not present

## 2021-10-20 DIAGNOSIS — E43 Unspecified severe protein-calorie malnutrition: Secondary | ICD-10-CM | POA: Diagnosis not present

## 2021-10-20 DIAGNOSIS — Z4789 Encounter for other orthopedic aftercare: Secondary | ICD-10-CM | POA: Diagnosis not present

## 2021-10-20 DIAGNOSIS — L89892 Pressure ulcer of other site, stage 2: Secondary | ICD-10-CM | POA: Diagnosis not present

## 2021-10-20 DIAGNOSIS — M86451 Chronic osteomyelitis with draining sinus, right femur: Secondary | ICD-10-CM | POA: Diagnosis not present

## 2021-10-20 DIAGNOSIS — Z466 Encounter for fitting and adjustment of urinary device: Secondary | ICD-10-CM | POA: Diagnosis not present

## 2021-10-20 DIAGNOSIS — L89224 Pressure ulcer of left hip, stage 4: Secondary | ICD-10-CM | POA: Diagnosis not present

## 2021-10-20 DIAGNOSIS — L89624 Pressure ulcer of left heel, stage 4: Secondary | ICD-10-CM | POA: Diagnosis not present

## 2021-10-20 DIAGNOSIS — Z452 Encounter for adjustment and management of vascular access device: Secondary | ICD-10-CM | POA: Diagnosis not present

## 2021-10-20 DIAGNOSIS — G8220 Paraplegia, unspecified: Secondary | ICD-10-CM | POA: Diagnosis not present

## 2021-10-20 DIAGNOSIS — L89324 Pressure ulcer of left buttock, stage 4: Secondary | ICD-10-CM | POA: Diagnosis not present

## 2021-10-22 ENCOUNTER — Telehealth: Payer: Self-pay | Admitting: *Deleted

## 2021-10-22 DIAGNOSIS — Z9889 Other specified postprocedural states: Secondary | ICD-10-CM | POA: Diagnosis not present

## 2021-10-22 DIAGNOSIS — Z96641 Presence of right artificial hip joint: Secondary | ICD-10-CM | POA: Diagnosis not present

## 2021-10-22 DIAGNOSIS — T8450XA Infection and inflammatory reaction due to unspecified internal joint prosthesis, initial encounter: Secondary | ICD-10-CM | POA: Diagnosis not present

## 2021-10-22 DIAGNOSIS — T8451XD Infection and inflammatory reaction due to internal right hip prosthesis, subsequent encounter: Secondary | ICD-10-CM | POA: Diagnosis not present

## 2021-10-22 DIAGNOSIS — N319 Neuromuscular dysfunction of bladder, unspecified: Secondary | ICD-10-CM | POA: Diagnosis not present

## 2021-10-22 DIAGNOSIS — T8451XA Infection and inflammatory reaction due to internal right hip prosthesis, initial encounter: Secondary | ICD-10-CM | POA: Diagnosis not present

## 2021-10-22 NOTE — Telephone Encounter (Signed)
Kim [name] with [facility] called and left VM with 2 FYI  1- PICC line came out yesterday and it will be getting replaced on Thursday 7/20.  2- she has to report that there is level 2 possible reaction between pts benadryl and potassium.  These will both be continued unless you state otherwise.

## 2021-10-23 ENCOUNTER — Ambulatory Visit: Payer: Medicare Other | Admitting: Family

## 2021-10-23 ENCOUNTER — Encounter: Payer: Self-pay | Admitting: Family

## 2021-10-23 NOTE — Telephone Encounter (Signed)
OK, has follow up appt with me today.

## 2021-10-24 DIAGNOSIS — E43 Unspecified severe protein-calorie malnutrition: Secondary | ICD-10-CM | POA: Diagnosis not present

## 2021-10-24 DIAGNOSIS — Z452 Encounter for adjustment and management of vascular access device: Secondary | ICD-10-CM | POA: Diagnosis not present

## 2021-10-24 DIAGNOSIS — M009 Pyogenic arthritis, unspecified: Secondary | ICD-10-CM | POA: Diagnosis not present

## 2021-10-24 DIAGNOSIS — Z4789 Encounter for other orthopedic aftercare: Secondary | ICD-10-CM | POA: Diagnosis not present

## 2021-10-24 DIAGNOSIS — G8220 Paraplegia, unspecified: Secondary | ICD-10-CM | POA: Diagnosis not present

## 2021-10-24 DIAGNOSIS — L89624 Pressure ulcer of left heel, stage 4: Secondary | ICD-10-CM | POA: Diagnosis not present

## 2021-10-24 DIAGNOSIS — L89324 Pressure ulcer of left buttock, stage 4: Secondary | ICD-10-CM | POA: Diagnosis not present

## 2021-10-24 DIAGNOSIS — L89892 Pressure ulcer of other site, stage 2: Secondary | ICD-10-CM | POA: Diagnosis not present

## 2021-10-24 DIAGNOSIS — Z466 Encounter for fitting and adjustment of urinary device: Secondary | ICD-10-CM | POA: Diagnosis not present

## 2021-10-24 DIAGNOSIS — M86451 Chronic osteomyelitis with draining sinus, right femur: Secondary | ICD-10-CM | POA: Diagnosis not present

## 2021-10-24 DIAGNOSIS — L89224 Pressure ulcer of left hip, stage 4: Secondary | ICD-10-CM | POA: Diagnosis not present

## 2021-10-24 DIAGNOSIS — Z792 Long term (current) use of antibiotics: Secondary | ICD-10-CM | POA: Diagnosis not present

## 2021-10-25 DIAGNOSIS — L89892 Pressure ulcer of other site, stage 2: Secondary | ICD-10-CM | POA: Diagnosis not present

## 2021-10-25 DIAGNOSIS — Z4789 Encounter for other orthopedic aftercare: Secondary | ICD-10-CM | POA: Diagnosis not present

## 2021-10-25 DIAGNOSIS — Z452 Encounter for adjustment and management of vascular access device: Secondary | ICD-10-CM | POA: Diagnosis not present

## 2021-10-25 DIAGNOSIS — L89224 Pressure ulcer of left hip, stage 4: Secondary | ICD-10-CM | POA: Diagnosis not present

## 2021-10-25 DIAGNOSIS — L89324 Pressure ulcer of left buttock, stage 4: Secondary | ICD-10-CM | POA: Diagnosis not present

## 2021-10-25 DIAGNOSIS — L8944 Pressure ulcer of contiguous site of back, buttock and hip, stage 4: Secondary | ICD-10-CM | POA: Diagnosis not present

## 2021-10-25 DIAGNOSIS — E43 Unspecified severe protein-calorie malnutrition: Secondary | ICD-10-CM | POA: Diagnosis not present

## 2021-10-25 DIAGNOSIS — M86451 Chronic osteomyelitis with draining sinus, right femur: Secondary | ICD-10-CM | POA: Diagnosis not present

## 2021-10-25 DIAGNOSIS — L89624 Pressure ulcer of left heel, stage 4: Secondary | ICD-10-CM | POA: Diagnosis not present

## 2021-10-25 DIAGNOSIS — G8220 Paraplegia, unspecified: Secondary | ICD-10-CM | POA: Diagnosis not present

## 2021-10-25 DIAGNOSIS — Z466 Encounter for fitting and adjustment of urinary device: Secondary | ICD-10-CM | POA: Diagnosis not present

## 2021-10-27 DIAGNOSIS — M869 Osteomyelitis, unspecified: Secondary | ICD-10-CM | POA: Diagnosis not present

## 2021-10-27 DIAGNOSIS — Z466 Encounter for fitting and adjustment of urinary device: Secondary | ICD-10-CM | POA: Diagnosis not present

## 2021-10-27 DIAGNOSIS — G8220 Paraplegia, unspecified: Secondary | ICD-10-CM | POA: Diagnosis not present

## 2021-10-27 DIAGNOSIS — L89624 Pressure ulcer of left heel, stage 4: Secondary | ICD-10-CM | POA: Diagnosis not present

## 2021-10-27 DIAGNOSIS — Z452 Encounter for adjustment and management of vascular access device: Secondary | ICD-10-CM | POA: Diagnosis not present

## 2021-10-27 DIAGNOSIS — E43 Unspecified severe protein-calorie malnutrition: Secondary | ICD-10-CM | POA: Diagnosis not present

## 2021-10-27 DIAGNOSIS — A419 Sepsis, unspecified organism: Secondary | ICD-10-CM | POA: Diagnosis not present

## 2021-10-27 DIAGNOSIS — L89224 Pressure ulcer of left hip, stage 4: Secondary | ICD-10-CM | POA: Diagnosis not present

## 2021-10-27 DIAGNOSIS — M86451 Chronic osteomyelitis with draining sinus, right femur: Secondary | ICD-10-CM | POA: Diagnosis not present

## 2021-10-27 DIAGNOSIS — L89892 Pressure ulcer of other site, stage 2: Secondary | ICD-10-CM | POA: Diagnosis not present

## 2021-10-27 DIAGNOSIS — Z4789 Encounter for other orthopedic aftercare: Secondary | ICD-10-CM | POA: Diagnosis not present

## 2021-10-27 DIAGNOSIS — L89324 Pressure ulcer of left buttock, stage 4: Secondary | ICD-10-CM | POA: Diagnosis not present

## 2021-10-29 DIAGNOSIS — E43 Unspecified severe protein-calorie malnutrition: Secondary | ICD-10-CM | POA: Diagnosis not present

## 2021-10-29 DIAGNOSIS — M86451 Chronic osteomyelitis with draining sinus, right femur: Secondary | ICD-10-CM | POA: Diagnosis not present

## 2021-10-29 DIAGNOSIS — Z03818 Encounter for observation for suspected exposure to other biological agents ruled out: Secondary | ICD-10-CM | POA: Diagnosis not present

## 2021-10-29 DIAGNOSIS — L89224 Pressure ulcer of left hip, stage 4: Secondary | ICD-10-CM | POA: Diagnosis not present

## 2021-10-29 DIAGNOSIS — L89624 Pressure ulcer of left heel, stage 4: Secondary | ICD-10-CM | POA: Diagnosis not present

## 2021-10-29 DIAGNOSIS — Z4789 Encounter for other orthopedic aftercare: Secondary | ICD-10-CM | POA: Diagnosis not present

## 2021-10-29 DIAGNOSIS — L89892 Pressure ulcer of other site, stage 2: Secondary | ICD-10-CM | POA: Diagnosis not present

## 2021-10-29 DIAGNOSIS — G8220 Paraplegia, unspecified: Secondary | ICD-10-CM | POA: Diagnosis not present

## 2021-10-29 DIAGNOSIS — Z452 Encounter for adjustment and management of vascular access device: Secondary | ICD-10-CM | POA: Diagnosis not present

## 2021-10-29 DIAGNOSIS — Z466 Encounter for fitting and adjustment of urinary device: Secondary | ICD-10-CM | POA: Diagnosis not present

## 2021-10-29 DIAGNOSIS — L89324 Pressure ulcer of left buttock, stage 4: Secondary | ICD-10-CM | POA: Diagnosis not present

## 2021-10-31 DIAGNOSIS — L89324 Pressure ulcer of left buttock, stage 4: Secondary | ICD-10-CM | POA: Diagnosis not present

## 2021-10-31 DIAGNOSIS — L89224 Pressure ulcer of left hip, stage 4: Secondary | ICD-10-CM | POA: Diagnosis not present

## 2021-10-31 DIAGNOSIS — Z466 Encounter for fitting and adjustment of urinary device: Secondary | ICD-10-CM | POA: Diagnosis not present

## 2021-10-31 DIAGNOSIS — L89624 Pressure ulcer of left heel, stage 4: Secondary | ICD-10-CM | POA: Diagnosis not present

## 2021-10-31 DIAGNOSIS — L89892 Pressure ulcer of other site, stage 2: Secondary | ICD-10-CM | POA: Diagnosis not present

## 2021-10-31 DIAGNOSIS — M86451 Chronic osteomyelitis with draining sinus, right femur: Secondary | ICD-10-CM | POA: Diagnosis not present

## 2021-10-31 DIAGNOSIS — T8450XA Infection and inflammatory reaction due to unspecified internal joint prosthesis, initial encounter: Secondary | ICD-10-CM | POA: Diagnosis not present

## 2021-10-31 DIAGNOSIS — G8220 Paraplegia, unspecified: Secondary | ICD-10-CM | POA: Diagnosis not present

## 2021-10-31 DIAGNOSIS — E43 Unspecified severe protein-calorie malnutrition: Secondary | ICD-10-CM | POA: Diagnosis not present

## 2021-10-31 DIAGNOSIS — Z4789 Encounter for other orthopedic aftercare: Secondary | ICD-10-CM | POA: Diagnosis not present

## 2021-10-31 DIAGNOSIS — Z452 Encounter for adjustment and management of vascular access device: Secondary | ICD-10-CM | POA: Diagnosis not present

## 2021-11-01 DIAGNOSIS — T8450XA Infection and inflammatory reaction due to unspecified internal joint prosthesis, initial encounter: Secondary | ICD-10-CM | POA: Diagnosis not present

## 2021-11-03 DIAGNOSIS — A419 Sepsis, unspecified organism: Secondary | ICD-10-CM | POA: Diagnosis not present

## 2021-11-03 DIAGNOSIS — E43 Unspecified severe protein-calorie malnutrition: Secondary | ICD-10-CM | POA: Diagnosis not present

## 2021-11-03 DIAGNOSIS — L89624 Pressure ulcer of left heel, stage 4: Secondary | ICD-10-CM | POA: Diagnosis not present

## 2021-11-03 DIAGNOSIS — L89892 Pressure ulcer of other site, stage 2: Secondary | ICD-10-CM | POA: Diagnosis not present

## 2021-11-03 DIAGNOSIS — L89324 Pressure ulcer of left buttock, stage 4: Secondary | ICD-10-CM | POA: Diagnosis not present

## 2021-11-03 DIAGNOSIS — Z466 Encounter for fitting and adjustment of urinary device: Secondary | ICD-10-CM | POA: Diagnosis not present

## 2021-11-03 DIAGNOSIS — M86451 Chronic osteomyelitis with draining sinus, right femur: Secondary | ICD-10-CM | POA: Diagnosis not present

## 2021-11-03 DIAGNOSIS — Z4789 Encounter for other orthopedic aftercare: Secondary | ICD-10-CM | POA: Diagnosis not present

## 2021-11-03 DIAGNOSIS — L89224 Pressure ulcer of left hip, stage 4: Secondary | ICD-10-CM | POA: Diagnosis not present

## 2021-11-03 DIAGNOSIS — G8220 Paraplegia, unspecified: Secondary | ICD-10-CM | POA: Diagnosis not present

## 2021-11-03 DIAGNOSIS — M869 Osteomyelitis, unspecified: Secondary | ICD-10-CM | POA: Diagnosis not present

## 2021-11-03 DIAGNOSIS — Z452 Encounter for adjustment and management of vascular access device: Secondary | ICD-10-CM | POA: Diagnosis not present

## 2021-11-03 DIAGNOSIS — T8450XA Infection and inflammatory reaction due to unspecified internal joint prosthesis, initial encounter: Secondary | ICD-10-CM | POA: Diagnosis not present

## 2021-11-04 DIAGNOSIS — L89224 Pressure ulcer of left hip, stage 4: Secondary | ICD-10-CM | POA: Diagnosis not present

## 2021-11-04 DIAGNOSIS — Z466 Encounter for fitting and adjustment of urinary device: Secondary | ICD-10-CM | POA: Diagnosis not present

## 2021-11-04 DIAGNOSIS — Z452 Encounter for adjustment and management of vascular access device: Secondary | ICD-10-CM | POA: Diagnosis not present

## 2021-11-04 DIAGNOSIS — L89324 Pressure ulcer of left buttock, stage 4: Secondary | ICD-10-CM | POA: Diagnosis not present

## 2021-11-04 DIAGNOSIS — M86451 Chronic osteomyelitis with draining sinus, right femur: Secondary | ICD-10-CM | POA: Diagnosis not present

## 2021-11-04 DIAGNOSIS — E43 Unspecified severe protein-calorie malnutrition: Secondary | ICD-10-CM | POA: Diagnosis not present

## 2021-11-04 DIAGNOSIS — G8220 Paraplegia, unspecified: Secondary | ICD-10-CM | POA: Diagnosis not present

## 2021-11-04 DIAGNOSIS — L89624 Pressure ulcer of left heel, stage 4: Secondary | ICD-10-CM | POA: Diagnosis not present

## 2021-11-04 DIAGNOSIS — L89892 Pressure ulcer of other site, stage 2: Secondary | ICD-10-CM | POA: Diagnosis not present

## 2021-11-04 DIAGNOSIS — Z4789 Encounter for other orthopedic aftercare: Secondary | ICD-10-CM | POA: Diagnosis not present

## 2021-11-05 ENCOUNTER — Ambulatory Visit (INDEPENDENT_AMBULATORY_CARE_PROVIDER_SITE_OTHER): Payer: Medicare Other

## 2021-11-05 DIAGNOSIS — F17210 Nicotine dependence, cigarettes, uncomplicated: Secondary | ICD-10-CM

## 2021-11-05 DIAGNOSIS — L89224 Pressure ulcer of left hip, stage 4: Secondary | ICD-10-CM | POA: Diagnosis not present

## 2021-11-05 DIAGNOSIS — Z466 Encounter for fitting and adjustment of urinary device: Secondary | ICD-10-CM | POA: Diagnosis not present

## 2021-11-05 DIAGNOSIS — L89624 Pressure ulcer of left heel, stage 4: Secondary | ICD-10-CM | POA: Diagnosis not present

## 2021-11-05 DIAGNOSIS — M86451 Chronic osteomyelitis with draining sinus, right femur: Secondary | ICD-10-CM | POA: Diagnosis not present

## 2021-11-05 DIAGNOSIS — L89324 Pressure ulcer of left buttock, stage 4: Secondary | ICD-10-CM | POA: Diagnosis not present

## 2021-11-05 DIAGNOSIS — D638 Anemia in other chronic diseases classified elsewhere: Secondary | ICD-10-CM | POA: Diagnosis not present

## 2021-11-05 DIAGNOSIS — Z452 Encounter for adjustment and management of vascular access device: Secondary | ICD-10-CM | POA: Diagnosis not present

## 2021-11-05 DIAGNOSIS — Z4789 Encounter for other orthopedic aftercare: Secondary | ICD-10-CM | POA: Diagnosis not present

## 2021-11-05 DIAGNOSIS — G8220 Paraplegia, unspecified: Secondary | ICD-10-CM | POA: Diagnosis not present

## 2021-11-05 DIAGNOSIS — E43 Unspecified severe protein-calorie malnutrition: Secondary | ICD-10-CM | POA: Diagnosis not present

## 2021-11-05 DIAGNOSIS — I959 Hypotension, unspecified: Secondary | ICD-10-CM

## 2021-11-05 DIAGNOSIS — F1290 Cannabis use, unspecified, uncomplicated: Secondary | ICD-10-CM

## 2021-11-05 DIAGNOSIS — L89892 Pressure ulcer of other site, stage 2: Secondary | ICD-10-CM | POA: Diagnosis not present

## 2021-11-06 DIAGNOSIS — L89324 Pressure ulcer of left buttock, stage 4: Secondary | ICD-10-CM | POA: Diagnosis not present

## 2021-11-06 DIAGNOSIS — L89314 Pressure ulcer of right buttock, stage 4: Secondary | ICD-10-CM | POA: Diagnosis not present

## 2021-11-06 DIAGNOSIS — L89624 Pressure ulcer of left heel, stage 4: Secondary | ICD-10-CM | POA: Diagnosis not present

## 2021-11-06 DIAGNOSIS — L89224 Pressure ulcer of left hip, stage 4: Secondary | ICD-10-CM | POA: Diagnosis not present

## 2021-11-06 DIAGNOSIS — L89214 Pressure ulcer of right hip, stage 4: Secondary | ICD-10-CM | POA: Diagnosis not present

## 2021-11-06 DIAGNOSIS — L02415 Cutaneous abscess of right lower limb: Secondary | ICD-10-CM | POA: Diagnosis not present

## 2021-11-07 DIAGNOSIS — Z452 Encounter for adjustment and management of vascular access device: Secondary | ICD-10-CM | POA: Diagnosis not present

## 2021-11-07 DIAGNOSIS — E43 Unspecified severe protein-calorie malnutrition: Secondary | ICD-10-CM | POA: Diagnosis not present

## 2021-11-07 DIAGNOSIS — M86451 Chronic osteomyelitis with draining sinus, right femur: Secondary | ICD-10-CM | POA: Diagnosis not present

## 2021-11-07 DIAGNOSIS — Z4789 Encounter for other orthopedic aftercare: Secondary | ICD-10-CM | POA: Diagnosis not present

## 2021-11-07 DIAGNOSIS — L89624 Pressure ulcer of left heel, stage 4: Secondary | ICD-10-CM | POA: Diagnosis not present

## 2021-11-07 DIAGNOSIS — L89324 Pressure ulcer of left buttock, stage 4: Secondary | ICD-10-CM | POA: Diagnosis not present

## 2021-11-07 DIAGNOSIS — L89224 Pressure ulcer of left hip, stage 4: Secondary | ICD-10-CM | POA: Diagnosis not present

## 2021-11-07 DIAGNOSIS — Z466 Encounter for fitting and adjustment of urinary device: Secondary | ICD-10-CM | POA: Diagnosis not present

## 2021-11-07 DIAGNOSIS — G8220 Paraplegia, unspecified: Secondary | ICD-10-CM | POA: Diagnosis not present

## 2021-11-07 DIAGNOSIS — L89892 Pressure ulcer of other site, stage 2: Secondary | ICD-10-CM | POA: Diagnosis not present

## 2021-11-08 DIAGNOSIS — T8450XA Infection and inflammatory reaction due to unspecified internal joint prosthesis, initial encounter: Secondary | ICD-10-CM | POA: Diagnosis not present

## 2021-11-10 DIAGNOSIS — Z4789 Encounter for other orthopedic aftercare: Secondary | ICD-10-CM | POA: Diagnosis not present

## 2021-11-10 DIAGNOSIS — Z466 Encounter for fitting and adjustment of urinary device: Secondary | ICD-10-CM | POA: Diagnosis not present

## 2021-11-10 DIAGNOSIS — L89892 Pressure ulcer of other site, stage 2: Secondary | ICD-10-CM | POA: Diagnosis not present

## 2021-11-10 DIAGNOSIS — G8220 Paraplegia, unspecified: Secondary | ICD-10-CM | POA: Diagnosis not present

## 2021-11-10 DIAGNOSIS — L89624 Pressure ulcer of left heel, stage 4: Secondary | ICD-10-CM | POA: Diagnosis not present

## 2021-11-10 DIAGNOSIS — M86451 Chronic osteomyelitis with draining sinus, right femur: Secondary | ICD-10-CM | POA: Diagnosis not present

## 2021-11-10 DIAGNOSIS — Z452 Encounter for adjustment and management of vascular access device: Secondary | ICD-10-CM | POA: Diagnosis not present

## 2021-11-10 DIAGNOSIS — L89224 Pressure ulcer of left hip, stage 4: Secondary | ICD-10-CM | POA: Diagnosis not present

## 2021-11-10 DIAGNOSIS — L89324 Pressure ulcer of left buttock, stage 4: Secondary | ICD-10-CM | POA: Diagnosis not present

## 2021-11-10 DIAGNOSIS — E43 Unspecified severe protein-calorie malnutrition: Secondary | ICD-10-CM | POA: Diagnosis not present

## 2021-11-11 ENCOUNTER — Encounter: Payer: Self-pay | Admitting: Family

## 2021-11-11 ENCOUNTER — Ambulatory Visit (INDEPENDENT_AMBULATORY_CARE_PROVIDER_SITE_OTHER): Payer: Medicare Other | Admitting: Family

## 2021-11-11 VITALS — BP 121/70 | HR 105 | Temp 98.1°F | Ht 66.0 in | Wt 138.0 lb

## 2021-11-11 DIAGNOSIS — Z993 Dependence on wheelchair: Secondary | ICD-10-CM

## 2021-11-11 DIAGNOSIS — S343XXS Injury of cauda equina, sequela: Secondary | ICD-10-CM

## 2021-11-11 DIAGNOSIS — Z720 Tobacco use: Secondary | ICD-10-CM | POA: Diagnosis not present

## 2021-11-11 DIAGNOSIS — E43 Unspecified severe protein-calorie malnutrition: Secondary | ICD-10-CM

## 2021-11-11 DIAGNOSIS — N319 Neuromuscular dysfunction of bladder, unspecified: Secondary | ICD-10-CM | POA: Diagnosis not present

## 2021-11-11 DIAGNOSIS — L89624 Pressure ulcer of left heel, stage 4: Secondary | ICD-10-CM | POA: Diagnosis not present

## 2021-11-11 DIAGNOSIS — E440 Moderate protein-calorie malnutrition: Secondary | ICD-10-CM | POA: Diagnosis not present

## 2021-11-11 DIAGNOSIS — L8944 Pressure ulcer of contiguous site of back, buttock and hip, stage 4: Secondary | ICD-10-CM

## 2021-11-11 NOTE — Patient Instructions (Signed)
Protein-Energy Malnutrition Protein-energy malnutrition is when a person does not eat enough protein, fat, and calories. When this happens over time, it can lead to severe loss of muscle tissue (muscle wasting). This condition also affects the body's defense system (immune system) and can lead to other health problems. What are the causes? This condition may be caused by: Not eating enough protein, fat, or calories. Having certain chronic medical conditions. Eating too little. What increases the risk? The following factors may make you more likely to develop this condition: Living in poverty. Long-term hospitalization. Alcohol or drug dependency. Addiction often leads to a lifestyle in which proper diet is ignored. Dependency can also hurt the metabolism and the body's ability to absorb nutrients. Eating disorders, such as anorexia nervosa or bulimia. Chewing or swallowing problems. People with these disorders may not eat enough. Having certain conditions, such as: Inflammatory bowel disease. Inflammation of the intestines makes it difficult for the body to absorb nutrients. Cancer or AIDS. These diseases can cause a loss of appetite. Chronic heart failure. This interferes with how the body uses nutrients. Cystic fibrosis. This disease can make it difficult for the body to absorb nutrients. Eating a diet that extremely restricts protein, fat, or calorie intake. What are the signs or symptoms? Symptoms of this condition include: Tiredness (fatigue). Weakness. Dizziness. Fainting. Weight loss. Loss of muscle tone and muscle mass. Poor immune response. Lack of menstruation. Poor memory. Hair loss. Skin changes. How is this diagnosed? This condition may be diagnosed based on: Your medical and dietary history. A physical exam. This may include a measurement of your body mass index. Blood tests. How is this treated? This condition may be managed with: Nutrition therapy. This may  include working with a dietitian. Treatment for underlying conditions. People with severe protein-energy malnutrition may need to be treated in a hospital. This may involve receiving nutrition and fluids through an IV. Follow these instructions at home:  Eat a balanced diet. In each meal, include at least one food that is high in protein. Foods that are high in protein include: Meat. Poultry. Fish. Eggs. Cheese. Milk. Beans. Nuts. Eat nutrient-rich foods that are easy to swallow and digest, such as: Fruit and yogurt smoothies. Oatmeal with nut butter. Nutrition supplement drinks. Try to eat six small meals each day instead of three large meals. Take vitamin and protein supplements as told by your health care provider or dietitian. Follow your health care provider's recommendations about exercise and activity. Keep all follow-up visits. This is important. Contact a health care provider if: You have increased weakness or fatigue. You faint. You are a woman and you stop having your period (menstruating). You have rapid hair loss. You have unexpected weight loss. You have diarrhea. You have nausea and vomiting. Get help right away if: You have difficulty breathing. You have chest pain. These symptoms may represent a serious problem that is an emergency. Do not wait to see if the symptoms will go away. Get medical help right away. Call your local emergency services (911 in the [place] Do not drive yourself to the hospital. Summary Protein-energy malnutrition is when a person does not eat enough protein, fat, and calories. Protein-energy malnutrition can lead to severe loss of muscle tissue (muscle wasting). This condition also affects the body's defense system (immune system) and can lead to other health problems. Talk with your health care provider about treatment for this condition. Effective treatment depends on the underlying cause of the malnutrition. This information is not    intended to replace advice given to you by your health care provider. Make sure you discuss any questions you have with your health care provider. Document Revised: 03/23/2020 Document Reviewed: 03/23/2020 Elsevier Patient Education  2023 Elsevier Inc.  

## 2021-11-11 NOTE — Progress Notes (Signed)
Subjective:    Patient ID: Kim [name], female    DOB: [birth year], 46 y.o.   MRN: [id number]  Chief Complaint  Patient presents with   Referral   Pt presents to the office today for referral to podiatry.   She currently has two pressure ulcers on her buttocks that are connected to a wound vac. Home health comes out three times a week to change it. She has a CNA .   She had her artifical hip joint removed 09/07/21. Still doing wet to dry dressing and packing the wound.  Also has an ulcer on left heel.   She has a PICC line and is followed by ID.   She has hx of spinal cord injury and wheelchair bound. She is protein-calore malnutrition. Drinking boost QID.   She has neurogenic bladder and has foley in placed. She is followed by Urologists.   Having a lot of stress with his sons.  Nicotine Dependence Presents for follow-up visit. Symptoms include fatigue. Her urge triggers include company of smokers. She smokes 1 pack of cigarettes per day.      Review of Systems  Constitutional:  Positive for fatigue.  Musculoskeletal:  Positive for arthralgias, back pain, gait problem and joint swelling.  Skin:  Positive for wound.  All other systems reviewed and are negative.      Objective:   Physical Exam Vitals reviewed.  Constitutional:      General: She is not in acute distress.    Appearance: She is well-developed.  HENT:     Head: Normocephalic and atraumatic.  Eyes:     Pupils: Pupils are equal, round, and reactive to light.  Neck:     Thyroid: No thyromegaly.     Comments: underweight Cardiovascular:     Rate and Rhythm: Normal rate and regular rhythm.     Heart sounds: Normal heart sounds. No murmur heard. Pulmonary:     Effort: Pulmonary effort is normal. No respiratory distress.     Breath sounds: Normal breath sounds. No wheezing.  Abdominal:     General: Bowel sounds are normal. There is no distension.     Palpations: Abdomen is soft.     Tenderness:  There is no abdominal tenderness.  Musculoskeletal:        General: No tenderness. Normal range of motion.     Cervical back: Normal range of motion and neck supple.  Skin:    General: Skin is warm and dry.     Coloration: Skin is pale.     Findings: Wound present.          Comments: Wound vac in place for left buttocks, foley in place  Neurological:     Mental Status: She is alert and oriented to person, place, and time.     Cranial Nerves: No cranial nerve deficit.     Deep Tendon Reflexes: Reflexes are normal and symmetric.  Psychiatric:        Behavior: Behavior normal.        Thought Content: Thought content normal.        Judgment: Judgment normal.     BP 121/70   Pulse (!) 105   Temp 98.1 F (36.7 C) (Temporal)   Ht '5\' 6"'$  (1.676 m)   Wt 138 lb (62.6 kg) Comment: per PATIENT THIS WAS HER LAST WT  LMP 11/24/2012   BMI 22.27 kg/m      Assessment & Plan:  [name] comes in today with chief  complaint of Referral   Diagnosis and orders addressed:  1. Pressure injury of left heel, stage 4 ([place]) - Ambulatory referral to Podiatry - For home use only DME Other see comment - For home use only DME Other see comment - For home use only DME Other see comment  2. Pressure injury of contiguous region involving right buttock and hip, stage 4 ([place]) - For home use only DME Other see comment - For home use only DME Other see comment - For home use only DME Other see comment  3. Severe protein-calorie malnutrition ([place]) - For home use only DME Other see comment - For home use only DME Other see comment - For home use only DME Other see comment  4. Wheelchair dependent  - For home use only DME Other see comment - For home use only DME Other see comment  5. Tobacco abuse - For home use only DME Other see comment - For home use only DME Other see comment  6. Malnutrition of moderate degree - For home use only DME Other see comment - For home use only DME  Other see comment  7. Neurogenic bladder - For home use only DME Other see comment - For home use only DME Other see comment  8. Cauda equina spinal cord injury, sequela - For home use only DME Other see comment - For home use only DME Other see comment   Labs pending Health Maintenance reviewed Diet and exercise encouraged  Follow up plan: 3 months and keep follow up with specialists    [name], FNP

## 2021-11-12 DIAGNOSIS — S71002D Unspecified open wound, left hip, subsequent encounter: Secondary | ICD-10-CM | POA: Diagnosis not present

## 2021-11-12 DIAGNOSIS — M86451 Chronic osteomyelitis with draining sinus, right femur: Secondary | ICD-10-CM | POA: Diagnosis not present

## 2021-11-12 DIAGNOSIS — N319 Neuromuscular dysfunction of bladder, unspecified: Secondary | ICD-10-CM | POA: Diagnosis not present

## 2021-11-12 DIAGNOSIS — G8220 Paraplegia, unspecified: Secondary | ICD-10-CM | POA: Diagnosis not present

## 2021-11-12 DIAGNOSIS — L89224 Pressure ulcer of left hip, stage 4: Secondary | ICD-10-CM | POA: Diagnosis not present

## 2021-11-12 DIAGNOSIS — L89324 Pressure ulcer of left buttock, stage 4: Secondary | ICD-10-CM | POA: Diagnosis not present

## 2021-11-12 DIAGNOSIS — E43 Unspecified severe protein-calorie malnutrition: Secondary | ICD-10-CM | POA: Diagnosis not present

## 2021-11-12 DIAGNOSIS — M869 Osteomyelitis, unspecified: Secondary | ICD-10-CM | POA: Diagnosis not present

## 2021-11-12 DIAGNOSIS — Z993 Dependence on wheelchair: Secondary | ICD-10-CM | POA: Diagnosis not present

## 2021-11-12 DIAGNOSIS — L89892 Pressure ulcer of other site, stage 2: Secondary | ICD-10-CM | POA: Diagnosis not present

## 2021-11-12 DIAGNOSIS — Z466 Encounter for fitting and adjustment of urinary device: Secondary | ICD-10-CM | POA: Diagnosis not present

## 2021-11-12 DIAGNOSIS — L89624 Pressure ulcer of left heel, stage 4: Secondary | ICD-10-CM | POA: Diagnosis not present

## 2021-11-12 DIAGNOSIS — Z452 Encounter for adjustment and management of vascular access device: Secondary | ICD-10-CM | POA: Diagnosis not present

## 2021-11-12 DIAGNOSIS — Z4789 Encounter for other orthopedic aftercare: Secondary | ICD-10-CM | POA: Diagnosis not present

## 2021-11-13 DIAGNOSIS — Z452 Encounter for adjustment and management of vascular access device: Secondary | ICD-10-CM | POA: Diagnosis not present

## 2021-11-13 DIAGNOSIS — L89892 Pressure ulcer of other site, stage 2: Secondary | ICD-10-CM | POA: Diagnosis not present

## 2021-11-13 DIAGNOSIS — Z4789 Encounter for other orthopedic aftercare: Secondary | ICD-10-CM | POA: Diagnosis not present

## 2021-11-13 DIAGNOSIS — Z466 Encounter for fitting and adjustment of urinary device: Secondary | ICD-10-CM | POA: Diagnosis not present

## 2021-11-13 DIAGNOSIS — G8220 Paraplegia, unspecified: Secondary | ICD-10-CM | POA: Diagnosis not present

## 2021-11-13 DIAGNOSIS — L89224 Pressure ulcer of left hip, stage 4: Secondary | ICD-10-CM | POA: Diagnosis not present

## 2021-11-13 DIAGNOSIS — L89324 Pressure ulcer of left buttock, stage 4: Secondary | ICD-10-CM | POA: Diagnosis not present

## 2021-11-13 DIAGNOSIS — M86451 Chronic osteomyelitis with draining sinus, right femur: Secondary | ICD-10-CM | POA: Diagnosis not present

## 2021-11-13 DIAGNOSIS — E43 Unspecified severe protein-calorie malnutrition: Secondary | ICD-10-CM | POA: Diagnosis not present

## 2021-11-13 DIAGNOSIS — L89624 Pressure ulcer of left heel, stage 4: Secondary | ICD-10-CM | POA: Diagnosis not present

## 2021-11-14 DIAGNOSIS — L89624 Pressure ulcer of left heel, stage 4: Secondary | ICD-10-CM | POA: Diagnosis not present

## 2021-11-14 DIAGNOSIS — Z466 Encounter for fitting and adjustment of urinary device: Secondary | ICD-10-CM | POA: Diagnosis not present

## 2021-11-14 DIAGNOSIS — Z4789 Encounter for other orthopedic aftercare: Secondary | ICD-10-CM | POA: Diagnosis not present

## 2021-11-14 DIAGNOSIS — L89224 Pressure ulcer of left hip, stage 4: Secondary | ICD-10-CM | POA: Diagnosis not present

## 2021-11-14 DIAGNOSIS — Z452 Encounter for adjustment and management of vascular access device: Secondary | ICD-10-CM | POA: Diagnosis not present

## 2021-11-14 DIAGNOSIS — M86451 Chronic osteomyelitis with draining sinus, right femur: Secondary | ICD-10-CM | POA: Diagnosis not present

## 2021-11-14 DIAGNOSIS — L89324 Pressure ulcer of left buttock, stage 4: Secondary | ICD-10-CM | POA: Diagnosis not present

## 2021-11-14 DIAGNOSIS — E43 Unspecified severe protein-calorie malnutrition: Secondary | ICD-10-CM | POA: Diagnosis not present

## 2021-11-14 DIAGNOSIS — L89892 Pressure ulcer of other site, stage 2: Secondary | ICD-10-CM | POA: Diagnosis not present

## 2021-11-14 DIAGNOSIS — G8220 Paraplegia, unspecified: Secondary | ICD-10-CM | POA: Diagnosis not present

## 2021-11-15 DIAGNOSIS — T8450XA Infection and inflammatory reaction due to unspecified internal joint prosthesis, initial encounter: Secondary | ICD-10-CM | POA: Diagnosis not present

## 2021-11-17 DIAGNOSIS — M86451 Chronic osteomyelitis with draining sinus, right femur: Secondary | ICD-10-CM | POA: Diagnosis not present

## 2021-11-17 DIAGNOSIS — L89624 Pressure ulcer of left heel, stage 4: Secondary | ICD-10-CM | POA: Diagnosis not present

## 2021-11-17 DIAGNOSIS — L89324 Pressure ulcer of left buttock, stage 4: Secondary | ICD-10-CM | POA: Diagnosis not present

## 2021-11-17 DIAGNOSIS — Z4789 Encounter for other orthopedic aftercare: Secondary | ICD-10-CM | POA: Diagnosis not present

## 2021-11-17 DIAGNOSIS — L89224 Pressure ulcer of left hip, stage 4: Secondary | ICD-10-CM | POA: Diagnosis not present

## 2021-11-17 DIAGNOSIS — G8220 Paraplegia, unspecified: Secondary | ICD-10-CM | POA: Diagnosis not present

## 2021-11-17 DIAGNOSIS — L89892 Pressure ulcer of other site, stage 2: Secondary | ICD-10-CM | POA: Diagnosis not present

## 2021-11-17 DIAGNOSIS — Z452 Encounter for adjustment and management of vascular access device: Secondary | ICD-10-CM | POA: Diagnosis not present

## 2021-11-17 DIAGNOSIS — E43 Unspecified severe protein-calorie malnutrition: Secondary | ICD-10-CM | POA: Diagnosis not present

## 2021-11-17 DIAGNOSIS — Z466 Encounter for fitting and adjustment of urinary device: Secondary | ICD-10-CM | POA: Diagnosis not present

## 2021-11-19 ENCOUNTER — Telehealth: Payer: Self-pay | Admitting: Family

## 2021-11-19 DIAGNOSIS — Z452 Encounter for adjustment and management of vascular access device: Secondary | ICD-10-CM | POA: Diagnosis not present

## 2021-11-19 DIAGNOSIS — L89892 Pressure ulcer of other site, stage 2: Secondary | ICD-10-CM | POA: Diagnosis not present

## 2021-11-19 DIAGNOSIS — L89224 Pressure ulcer of left hip, stage 4: Secondary | ICD-10-CM | POA: Diagnosis not present

## 2021-11-19 DIAGNOSIS — Z466 Encounter for fitting and adjustment of urinary device: Secondary | ICD-10-CM | POA: Diagnosis not present

## 2021-11-19 DIAGNOSIS — E43 Unspecified severe protein-calorie malnutrition: Secondary | ICD-10-CM | POA: Diagnosis not present

## 2021-11-19 DIAGNOSIS — Z4789 Encounter for other orthopedic aftercare: Secondary | ICD-10-CM | POA: Diagnosis not present

## 2021-11-19 DIAGNOSIS — L89324 Pressure ulcer of left buttock, stage 4: Secondary | ICD-10-CM | POA: Diagnosis not present

## 2021-11-19 DIAGNOSIS — M86451 Chronic osteomyelitis with draining sinus, right femur: Secondary | ICD-10-CM | POA: Diagnosis not present

## 2021-11-19 DIAGNOSIS — L89624 Pressure ulcer of left heel, stage 4: Secondary | ICD-10-CM | POA: Diagnosis not present

## 2021-11-19 DIAGNOSIS — G8220 Paraplegia, unspecified: Secondary | ICD-10-CM | POA: Diagnosis not present

## 2021-11-19 NOTE — Telephone Encounter (Signed)
Attempted to contact- NA  Needs appointment

## 2021-11-19 NOTE — Telephone Encounter (Signed)
Pt needs a refill on Nystatin powder 100,000 units per gram. It was given to her when she was at the hospital to help with the rash. Use [facility] in [place]

## 2021-11-21 DIAGNOSIS — G8220 Paraplegia, unspecified: Secondary | ICD-10-CM | POA: Diagnosis not present

## 2021-11-21 DIAGNOSIS — L89624 Pressure ulcer of left heel, stage 4: Secondary | ICD-10-CM | POA: Diagnosis not present

## 2021-11-21 DIAGNOSIS — Z466 Encounter for fitting and adjustment of urinary device: Secondary | ICD-10-CM | POA: Diagnosis not present

## 2021-11-21 DIAGNOSIS — A419 Sepsis, unspecified organism: Secondary | ICD-10-CM | POA: Diagnosis not present

## 2021-11-21 DIAGNOSIS — Z4789 Encounter for other orthopedic aftercare: Secondary | ICD-10-CM | POA: Diagnosis not present

## 2021-11-21 DIAGNOSIS — R339 Retention of urine, unspecified: Secondary | ICD-10-CM | POA: Diagnosis not present

## 2021-11-21 DIAGNOSIS — M869 Osteomyelitis, unspecified: Secondary | ICD-10-CM | POA: Diagnosis not present

## 2021-11-21 DIAGNOSIS — L89224 Pressure ulcer of left hip, stage 4: Secondary | ICD-10-CM | POA: Diagnosis not present

## 2021-11-21 DIAGNOSIS — Z452 Encounter for adjustment and management of vascular access device: Secondary | ICD-10-CM | POA: Diagnosis not present

## 2021-11-21 DIAGNOSIS — M86451 Chronic osteomyelitis with draining sinus, right femur: Secondary | ICD-10-CM | POA: Diagnosis not present

## 2021-11-21 DIAGNOSIS — E43 Unspecified severe protein-calorie malnutrition: Secondary | ICD-10-CM | POA: Diagnosis not present

## 2021-11-21 DIAGNOSIS — L89892 Pressure ulcer of other site, stage 2: Secondary | ICD-10-CM | POA: Diagnosis not present

## 2021-11-21 DIAGNOSIS — T8450XA Infection and inflammatory reaction due to unspecified internal joint prosthesis, initial encounter: Secondary | ICD-10-CM | POA: Diagnosis not present

## 2021-11-21 DIAGNOSIS — L89324 Pressure ulcer of left buttock, stage 4: Secondary | ICD-10-CM | POA: Diagnosis not present

## 2021-11-24 DIAGNOSIS — Z466 Encounter for fitting and adjustment of urinary device: Secondary | ICD-10-CM | POA: Diagnosis not present

## 2021-11-24 DIAGNOSIS — E43 Unspecified severe protein-calorie malnutrition: Secondary | ICD-10-CM | POA: Diagnosis not present

## 2021-11-24 DIAGNOSIS — M86451 Chronic osteomyelitis with draining sinus, right femur: Secondary | ICD-10-CM | POA: Diagnosis not present

## 2021-11-24 DIAGNOSIS — Z452 Encounter for adjustment and management of vascular access device: Secondary | ICD-10-CM | POA: Diagnosis not present

## 2021-11-24 DIAGNOSIS — L89224 Pressure ulcer of left hip, stage 4: Secondary | ICD-10-CM | POA: Diagnosis not present

## 2021-11-24 DIAGNOSIS — T8450XA Infection and inflammatory reaction due to unspecified internal joint prosthesis, initial encounter: Secondary | ICD-10-CM | POA: Diagnosis not present

## 2021-11-24 DIAGNOSIS — G8220 Paraplegia, unspecified: Secondary | ICD-10-CM | POA: Diagnosis not present

## 2021-11-24 DIAGNOSIS — L89624 Pressure ulcer of left heel, stage 4: Secondary | ICD-10-CM | POA: Diagnosis not present

## 2021-11-24 DIAGNOSIS — L89892 Pressure ulcer of other site, stage 2: Secondary | ICD-10-CM | POA: Diagnosis not present

## 2021-11-24 DIAGNOSIS — L89324 Pressure ulcer of left buttock, stage 4: Secondary | ICD-10-CM | POA: Diagnosis not present

## 2021-11-24 DIAGNOSIS — R339 Retention of urine, unspecified: Secondary | ICD-10-CM | POA: Diagnosis not present

## 2021-11-24 DIAGNOSIS — Z4789 Encounter for other orthopedic aftercare: Secondary | ICD-10-CM | POA: Diagnosis not present

## 2021-11-25 ENCOUNTER — Other Ambulatory Visit: Payer: Self-pay | Admitting: Family

## 2021-11-25 DIAGNOSIS — L89624 Pressure ulcer of left heel, stage 4: Secondary | ICD-10-CM | POA: Diagnosis not present

## 2021-11-25 DIAGNOSIS — L8944 Pressure ulcer of contiguous site of back, buttock and hip, stage 4: Secondary | ICD-10-CM | POA: Diagnosis not present

## 2021-11-26 DIAGNOSIS — Z452 Encounter for adjustment and management of vascular access device: Secondary | ICD-10-CM | POA: Diagnosis not present

## 2021-11-26 DIAGNOSIS — M86451 Chronic osteomyelitis with draining sinus, right femur: Secondary | ICD-10-CM | POA: Diagnosis not present

## 2021-11-26 DIAGNOSIS — E43 Unspecified severe protein-calorie malnutrition: Secondary | ICD-10-CM | POA: Diagnosis not present

## 2021-11-26 DIAGNOSIS — L89224 Pressure ulcer of left hip, stage 4: Secondary | ICD-10-CM | POA: Diagnosis not present

## 2021-11-26 DIAGNOSIS — Z4789 Encounter for other orthopedic aftercare: Secondary | ICD-10-CM | POA: Diagnosis not present

## 2021-11-26 DIAGNOSIS — L89624 Pressure ulcer of left heel, stage 4: Secondary | ICD-10-CM | POA: Diagnosis not present

## 2021-11-26 DIAGNOSIS — G8220 Paraplegia, unspecified: Secondary | ICD-10-CM | POA: Diagnosis not present

## 2021-11-26 DIAGNOSIS — L89892 Pressure ulcer of other site, stage 2: Secondary | ICD-10-CM | POA: Diagnosis not present

## 2021-11-26 DIAGNOSIS — L89324 Pressure ulcer of left buttock, stage 4: Secondary | ICD-10-CM | POA: Diagnosis not present

## 2021-11-26 DIAGNOSIS — Z466 Encounter for fitting and adjustment of urinary device: Secondary | ICD-10-CM | POA: Diagnosis not present

## 2021-11-26 DIAGNOSIS — Z03818 Encounter for observation for suspected exposure to other biological agents ruled out: Secondary | ICD-10-CM | POA: Diagnosis not present

## 2021-11-26 NOTE — Telephone Encounter (Signed)
Patient was seen on the 08/08

## 2021-11-26 NOTE — Telephone Encounter (Addendum)
RE: Gabapentin 300 mg, I denied yesterday, Historical med, I showed that it was DC'd on 08/22/21 Next OV 11/28/21 Please advise

## 2021-11-26 NOTE — Addendum Note (Signed)
Addended by: Antonietta Barcelona D on: 11/26/2021 04:47 PM   Modules accepted: Orders

## 2021-11-26 NOTE — Telephone Encounter (Signed)
Pt says that she should have refills on this rx because Alyse Low was suppose to take over refills. Pt says this rx was prescribed when she was in the hospital. Pt aware Alyse Low is off today and will address tomorrow.

## 2021-11-27 MED ORDER — GABAPENTIN 300 MG PO CAPS: 600.0000 mg | ORAL_CAPSULE | Freq: Two times a day (BID) | ORAL | 2 refills | Status: DC

## 2021-11-27 MED ORDER — NYSTATIN 100000 UNIT/GM EX POWD: 1.0000 | Freq: Three times a day (TID) | CUTANEOUS | 2 refills | Status: AC

## 2021-11-27 NOTE — Telephone Encounter (Signed)
LMOVM refill sent to pharmacy 

## 2021-11-27 NOTE — Telephone Encounter (Signed)
Prescription sent to pharmacy.

## 2021-11-27 NOTE — Addendum Note (Signed)
Addended by: [name] A on: 11/27/2021 01:21 PM   Modules accepted: Orders

## 2021-11-28 ENCOUNTER — Ambulatory Visit: Payer: Medicare Other | Admitting: Podiatry

## 2021-11-28 ENCOUNTER — Telehealth (INDEPENDENT_AMBULATORY_CARE_PROVIDER_SITE_OTHER): Payer: Medicare Other | Admitting: Family

## 2021-11-28 ENCOUNTER — Encounter: Payer: Self-pay | Admitting: Family

## 2021-11-28 DIAGNOSIS — K5900 Constipation, unspecified: Secondary | ICD-10-CM

## 2021-11-28 DIAGNOSIS — E440 Moderate protein-calorie malnutrition: Secondary | ICD-10-CM

## 2021-11-28 DIAGNOSIS — M86451 Chronic osteomyelitis with draining sinus, right femur: Secondary | ICD-10-CM | POA: Diagnosis not present

## 2021-11-28 DIAGNOSIS — L89892 Pressure ulcer of other site, stage 2: Secondary | ICD-10-CM | POA: Diagnosis not present

## 2021-11-28 DIAGNOSIS — Z466 Encounter for fitting and adjustment of urinary device: Secondary | ICD-10-CM | POA: Diagnosis not present

## 2021-11-28 DIAGNOSIS — G8220 Paraplegia, unspecified: Secondary | ICD-10-CM | POA: Diagnosis not present

## 2021-11-28 DIAGNOSIS — L89624 Pressure ulcer of left heel, stage 4: Secondary | ICD-10-CM | POA: Diagnosis not present

## 2021-11-28 DIAGNOSIS — R110 Nausea: Secondary | ICD-10-CM

## 2021-11-28 DIAGNOSIS — S343XXS Injury of cauda equina, sequela: Secondary | ICD-10-CM | POA: Diagnosis not present

## 2021-11-28 DIAGNOSIS — E43 Unspecified severe protein-calorie malnutrition: Secondary | ICD-10-CM | POA: Diagnosis not present

## 2021-11-28 DIAGNOSIS — L89224 Pressure ulcer of left hip, stage 4: Secondary | ICD-10-CM | POA: Diagnosis not present

## 2021-11-28 DIAGNOSIS — L8944 Pressure ulcer of contiguous site of back, buttock and hip, stage 4: Secondary | ICD-10-CM

## 2021-11-28 DIAGNOSIS — L89324 Pressure ulcer of left buttock, stage 4: Secondary | ICD-10-CM | POA: Diagnosis not present

## 2021-11-28 DIAGNOSIS — Z4789 Encounter for other orthopedic aftercare: Secondary | ICD-10-CM | POA: Diagnosis not present

## 2021-11-28 DIAGNOSIS — Z452 Encounter for adjustment and management of vascular access device: Secondary | ICD-10-CM | POA: Diagnosis not present

## 2021-11-28 MED ORDER — TRULANCE 3 MG PO TABS: 3.0000 mg | ORAL_TABLET | Freq: Every day | ORAL | 1 refills | Status: AC

## 2021-11-28 NOTE — Progress Notes (Signed)
Virtual Visit Consent   [name], you are scheduled for a virtual visit with a [facility] provider today. Just as with appointments in the office, your consent must be obtained to participate. Your consent will be active for this visit and any virtual visit you may have with one of our providers in the next 365 days. If you have a MyChart account, a copy of this consent can be sent to you electronically.  As this is a virtual visit, video technology does not allow for your provider to perform a traditional examination. This may limit your provider's ability to fully assess your condition. If your provider identifies any concerns that need to be evaluated in person or the need to arrange testing (such as labs, EKG, etc.), we will make arrangements to do so. Although advances in technology are sophisticated, we cannot ensure that it will always work on either your end or our end. If the connection with a video visit is poor, the visit may have to be switched to a telephone visit. With either a video or telephone visit, we are not always able to ensure that we have a secure connection.  By engaging in this virtual visit, you consent to the provision of healthcare and authorize for your insurance to be billed (if applicable) for the services provided during this visit. Depending on your insurance coverage, you may receive a charge related to this service.  I need to obtain your verbal consent now. Are you willing to proceed with your visit today? [name] has provided verbal consent on 11/28/2021 for a virtual visit (video or telephone). [name], FNP  Date: 11/28/2021 3:26 PM  Virtual Visit via Video Note   I, [name], connected with  Kim [name]  ([id number], 1975/09/15) on 11/28/21 at  2:40 PM EDT by a video-enabled telemedicine application and verified that I am speaking with the correct person using two identifiers.  Location: Patient: Virtual Visit Location  Patient: Home Provider: Virtual Visit Location Provider: Home Office   I discussed the limitations of evaluation and management by telemedicine and the availability of in person appointments. The patient expressed understanding and agreed to proceed.    History of Present Illness: Kim [name] is a 46 y.o. who identifies as a female who was assigned female at birth, and is being seen today for nausea and constipation.   She currently has two pressure ulcers on her buttocks that are connected to a wound vac. Home health comes out three times a week to change it. She has a CNA .    She had her artifical hip joint removed 09/07/21.Doing dry dressing.   Also has an ulcer on left heel.    She has a PICC line and is followed by ID and getting IV antibiotics.    She has hx of spinal cord injury and wheelchair bound. She is protein-calore malnutrition. Drinking boost QID.    She has neurogenic bladder and has foley in placed. She is followed by Urologists.   She is taking suboxone.    HPI: Constipation This is a chronic problem. The current episode started more than 1 year ago. The problem has been waxing and waning since onset. Her stool frequency is 1 time per week or less. The patient is not on a high fiber diet. She Does not exercise regularly. She has tried laxatives and stool softeners for the symptoms. The treatment provided mild relief.    Problems:  Patient Active Problem  List   Diagnosis Date Noted   Tobacco abuse 04/22/2021   Malnutrition of moderate degree 04/16/2021   Chronic osteomyelitis of left foot with draining sinus (HCC)    Severe protein-calorie malnutrition ([place]) 04/08/2021   Pressure injury of left hip, stage 3 ([place]) 03/04/2021   Pressure injury of contiguous region involving right buttock and hip, stage 4 ([place]) 02/10/2021   Pressure injury of left heel, stage 4 ([place]) 02/10/2021   Pressure injury of skin of right hip 01/28/2021   Non-healing open wound of  heel, initial encounter 04/12/2020   At risk for falls 02/09/2018   Post-menopausal osteoporosis 02/09/2018   Loosening of prosthetic hip ([place]) 06/01/2016   Encounter for routine gynecological examination with Papanicolaou smear of cervix 03/03/2016   Enlarged uterus 03/03/2016   Postmenopausal 03/03/2016   GAD (generalized anxiety disorder) 01/21/2016   Dislocation of right hip ([place]) 08/01/2015   Status post right hip replacement 07/24/2015   Neurogenic bladder 06/28/2015   Height loss 05/13/2015   Wheelchair dependent 05/13/2015   History of developmental dysplasia of the hip 04/29/2015   Primary osteoarthritis of knees, bilateral 04/29/2015   Osteopenia    Vitamin D deficiency 05/01/2014   Heterotopic ossification of bone 08/02/2013   Hip pain, bilateral 08/02/2013   Osteoarthritis of left knee 08/02/2013   Cauda equina spinal cord injury ([place]) 06/16/2013   Low back pain 05/26/2011   Osteoarthritis of thoracolumbar spine 03/23/2011   Left knee pain 02/08/2011   KNEE PAIN 05/23/2007   SPONDYLOSIS WITH MYELOPATHY LUMBAR REGION 05/23/2007    Allergies:  Allergies  Allergen Reactions   Keflex [Cephalexin] Diarrhea   Medications:  Current Outpatient Medications:    mirabegron ER (MYRBETRIQ) 25 MG TB24 tablet, 25 mg., Disp: , Rfl:    Plecanatide (TRULANCE) 3 MG TABS, Take 3 mg by mouth daily., Disp: 90 tablet, Rfl: 1   acetaminophen (TYLENOL) 650 MG suppository, Place 650 mg rectally every 4 (four) hours as needed., Disp: , Rfl:    amoxicillin (AMOXIL) 500 MG capsule, Take 4 capsules (2000 mg total) by mouth one hour prior to dental appointment, Disp: , Rfl:    Ascorbic Acid (VITAMIN C) 500 MG CAPS, Take 500 mg by mouth daily., Disp: 90 capsule, Rfl: 11   Buprenorphine HCl-Naloxone HCl 8-2 MG FILM, Place 1 Film under the tongue 3 (three) times daily., Disp: , Rfl:    CALCIUM-VITAMIN D PO, Take 1 tablet by mouth daily., Disp: , Rfl:    clonazePAM (KLONOPIN) 0.5 MG tablet, Take  0.5 mg by mouth 2 (two) times daily., Disp: , Rfl:    collagenase (SANTYL) ointment, Apply topically daily. (Patient not taking: Reported on 11/11/2021), Disp: 30 g, Rfl: 0   Elastic Bandages & Supports (HEEL/ANKLE PROTECTOR) MISC, 1 each by Does not apply route at bedtime., Disp: 2 each, Rfl: 1   ferrous sulfate 325 (65 FE) MG tablet, Take 1 tablet (325 mg total) by mouth 2 (two) times daily with a meal., Disp: 60 tablet, Rfl: 5   folic acid (FOLVITE) 1 MG tablet, Take 1 tablet (1 mg total) by mouth daily., Disp: 30 tablet, Rfl: 5   Foot Care Products (SOCK AID) MISC, 1 Device by Does not apply route daily., Disp: 1 each, Rfl: 0   gabapentin (NEURONTIN) 300 MG capsule, Take 2 capsules (600 mg total) by mouth 2 (two) times daily., Disp: 120 capsule, Rfl: 2   midodrine (PROAMATINE) 10 MG tablet, Take 1 tablet (10 mg total) by mouth 3 (three) times  daily with meals., Disp: 90 tablet, Rfl: 2   Misc. Devices (BED WEDGE) MISC, 1 each by Does not apply route 2 (two) times daily., Disp: 2 each, Rfl: 0   Misc. Devices (TRANSFER BENCH) MISC, 1 Device by Does not apply route as needed., Disp: 1 each, Rfl: 1   Misc. Devices [facility] CUSHION) MISC, 1 application by Does not apply route daily., Disp: 1 each, Rfl: 1   Multiple Vitamins-Calcium (ONE-A-DAY WOMENS PO), Take 2 tablets by mouth daily., Disp: , Rfl:    NARCAN 4 MG/0.1ML LIQD nasal spray kit, , Disp: , Rfl:    Nutritional Supplements (ENSURE HIGH PROTEIN) LIQD, Take 237 mLs by mouth 4 (four) times daily - after meals and at bedtime., Disp: 3792 mL, Rfl: 11   Nutritional Supplements (JUVEN NUTRIVIGOR) PACK, Take 1 each by mouth 3 (three) times daily., Disp: 180 each, Rfl: 3   Nutritional Supplements (PROMOD) LIQD, Take 30 mLs by mouth 2 (two) times daily., Disp: 946 mL, Rfl: 11   nystatin (MYCOSTATIN/NYSTOP) powder, Apply 1 Application topically 3 (three) times daily., Disp: 60 g, Rfl: 2   polyethylene glycol powder (GLYCOLAX/MIRALAX) 17 GM/SCOOP  powder, Take 17 g by mouth daily., Disp: 3350 g, Rfl: 1   POTASSIUM PO, Take 50 mg by mouth daily., Disp: , Rfl:    silver sulfADIAZINE (SILVADENE) 1 % cream, Apply 1 application topically daily., Disp: 50 g, Rfl: 0   sodium hypochlorite (DAKIN'S 1/2 STRENGTH) external solution, Apply to wounds as directed, Disp: , Rfl:    Zinc 50 MG CAPS, Take 1 capsule (50 mg total) by mouth daily., Disp: 90 capsule, Rfl: 1  Observations/Objective: Patient is well-developed, well-nourished in no acute distress.  Resting comfortably  at home.  Head is normocephalic, atraumatic.  No labored breathing. Speech is clear and coherent with logical content.  Patient is alert and oriented at baseline.  Dressing intact  Assessment and Plan: 1. Constipation, unspecified constipation type - Plecanatide (TRULANCE) 3 MG TABS; Take 3 mg by mouth daily.  Dispense: 90 tablet; Refill: 1  2. Pressure injury of left heel, stage 4 (HCC)  3. Malnutrition of moderate degree  4. Severe protein-calorie malnutrition ([place])  5. Pressure injury of contiguous region involving right buttock and hip, stage 4 (HCC)  6. Cauda equina spinal cord injury, sequela  7. Nausea  Start Trulance 3 mg  Force fluids I believe the constipation is causing his nausea  Keep chronic follow up with specialists   Follow Up Instructions: I discussed the assessment and treatment plan with the patient. The patient was provided an opportunity to ask questions and all were answered. The patient agreed with the plan and demonstrated an understanding of the instructions.  A copy of instructions were sent to the patient via MyChart unless otherwise noted below.     The patient was advised to call back or seek an in-person evaluation if the symptoms worsen or if the condition fails to improve as anticipated.  Time:  I spent 16 minutes with the patient via telehealth technology discussing the above problems/concerns.    [name], FNP

## 2021-12-01 DIAGNOSIS — M86451 Chronic osteomyelitis with draining sinus, right femur: Secondary | ICD-10-CM | POA: Diagnosis not present

## 2021-12-01 DIAGNOSIS — Z452 Encounter for adjustment and management of vascular access device: Secondary | ICD-10-CM | POA: Diagnosis not present

## 2021-12-01 DIAGNOSIS — G8220 Paraplegia, unspecified: Secondary | ICD-10-CM | POA: Diagnosis not present

## 2021-12-01 DIAGNOSIS — E43 Unspecified severe protein-calorie malnutrition: Secondary | ICD-10-CM | POA: Diagnosis not present

## 2021-12-01 DIAGNOSIS — L89324 Pressure ulcer of left buttock, stage 4: Secondary | ICD-10-CM | POA: Diagnosis not present

## 2021-12-01 DIAGNOSIS — Z466 Encounter for fitting and adjustment of urinary device: Secondary | ICD-10-CM | POA: Diagnosis not present

## 2021-12-01 DIAGNOSIS — Z4789 Encounter for other orthopedic aftercare: Secondary | ICD-10-CM | POA: Diagnosis not present

## 2021-12-01 DIAGNOSIS — L89892 Pressure ulcer of other site, stage 2: Secondary | ICD-10-CM | POA: Diagnosis not present

## 2021-12-01 DIAGNOSIS — L89624 Pressure ulcer of left heel, stage 4: Secondary | ICD-10-CM | POA: Diagnosis not present

## 2021-12-01 DIAGNOSIS — L89224 Pressure ulcer of left hip, stage 4: Secondary | ICD-10-CM | POA: Diagnosis not present

## 2021-12-02 DIAGNOSIS — N319 Neuromuscular dysfunction of bladder, unspecified: Secondary | ICD-10-CM | POA: Diagnosis not present

## 2021-12-02 DIAGNOSIS — Z8619 Personal history of other infectious and parasitic diseases: Secondary | ICD-10-CM | POA: Diagnosis not present

## 2021-12-02 DIAGNOSIS — T8451XD Infection and inflammatory reaction due to internal right hip prosthesis, subsequent encounter: Secondary | ICD-10-CM | POA: Diagnosis not present

## 2021-12-02 DIAGNOSIS — T8451XA Infection and inflammatory reaction due to internal right hip prosthesis, initial encounter: Secondary | ICD-10-CM | POA: Diagnosis not present

## 2021-12-02 DIAGNOSIS — Z993 Dependence on wheelchair: Secondary | ICD-10-CM | POA: Diagnosis not present

## 2021-12-02 DIAGNOSIS — Z96641 Presence of right artificial hip joint: Secondary | ICD-10-CM | POA: Diagnosis not present

## 2021-12-02 DIAGNOSIS — M869 Osteomyelitis, unspecified: Secondary | ICD-10-CM | POA: Diagnosis not present

## 2021-12-02 DIAGNOSIS — G8220 Paraplegia, unspecified: Secondary | ICD-10-CM | POA: Diagnosis not present

## 2021-12-03 DIAGNOSIS — L89624 Pressure ulcer of left heel, stage 4: Secondary | ICD-10-CM | POA: Diagnosis not present

## 2021-12-03 DIAGNOSIS — Z466 Encounter for fitting and adjustment of urinary device: Secondary | ICD-10-CM | POA: Diagnosis not present

## 2021-12-03 DIAGNOSIS — L89224 Pressure ulcer of left hip, stage 4: Secondary | ICD-10-CM | POA: Diagnosis not present

## 2021-12-03 DIAGNOSIS — G8220 Paraplegia, unspecified: Secondary | ICD-10-CM | POA: Diagnosis not present

## 2021-12-03 DIAGNOSIS — L89324 Pressure ulcer of left buttock, stage 4: Secondary | ICD-10-CM | POA: Diagnosis not present

## 2021-12-03 DIAGNOSIS — E43 Unspecified severe protein-calorie malnutrition: Secondary | ICD-10-CM | POA: Diagnosis not present

## 2021-12-03 DIAGNOSIS — Z452 Encounter for adjustment and management of vascular access device: Secondary | ICD-10-CM | POA: Diagnosis not present

## 2021-12-03 DIAGNOSIS — M86451 Chronic osteomyelitis with draining sinus, right femur: Secondary | ICD-10-CM | POA: Diagnosis not present

## 2021-12-03 DIAGNOSIS — L89892 Pressure ulcer of other site, stage 2: Secondary | ICD-10-CM | POA: Diagnosis not present

## 2021-12-03 DIAGNOSIS — Z4789 Encounter for other orthopedic aftercare: Secondary | ICD-10-CM | POA: Diagnosis not present

## 2021-12-05 DIAGNOSIS — L89224 Pressure ulcer of left hip, stage 4: Secondary | ICD-10-CM | POA: Diagnosis not present

## 2021-12-05 DIAGNOSIS — Z466 Encounter for fitting and adjustment of urinary device: Secondary | ICD-10-CM | POA: Diagnosis not present

## 2021-12-05 DIAGNOSIS — L89324 Pressure ulcer of left buttock, stage 4: Secondary | ICD-10-CM | POA: Diagnosis not present

## 2021-12-05 DIAGNOSIS — Z452 Encounter for adjustment and management of vascular access device: Secondary | ICD-10-CM | POA: Diagnosis not present

## 2021-12-05 DIAGNOSIS — Z4789 Encounter for other orthopedic aftercare: Secondary | ICD-10-CM | POA: Diagnosis not present

## 2021-12-05 DIAGNOSIS — E43 Unspecified severe protein-calorie malnutrition: Secondary | ICD-10-CM | POA: Diagnosis not present

## 2021-12-05 DIAGNOSIS — G8220 Paraplegia, unspecified: Secondary | ICD-10-CM | POA: Diagnosis not present

## 2021-12-05 DIAGNOSIS — L89624 Pressure ulcer of left heel, stage 4: Secondary | ICD-10-CM | POA: Diagnosis not present

## 2021-12-05 DIAGNOSIS — M86451 Chronic osteomyelitis with draining sinus, right femur: Secondary | ICD-10-CM | POA: Diagnosis not present

## 2021-12-05 DIAGNOSIS — L89892 Pressure ulcer of other site, stage 2: Secondary | ICD-10-CM | POA: Diagnosis not present

## 2021-12-09 DIAGNOSIS — M86451 Chronic osteomyelitis with draining sinus, right femur: Secondary | ICD-10-CM | POA: Diagnosis not present

## 2021-12-09 DIAGNOSIS — Z466 Encounter for fitting and adjustment of urinary device: Secondary | ICD-10-CM | POA: Diagnosis not present

## 2021-12-09 DIAGNOSIS — L89892 Pressure ulcer of other site, stage 2: Secondary | ICD-10-CM | POA: Diagnosis not present

## 2021-12-09 DIAGNOSIS — Z452 Encounter for adjustment and management of vascular access device: Secondary | ICD-10-CM | POA: Diagnosis not present

## 2021-12-09 DIAGNOSIS — G8220 Paraplegia, unspecified: Secondary | ICD-10-CM | POA: Diagnosis not present

## 2021-12-09 DIAGNOSIS — E43 Unspecified severe protein-calorie malnutrition: Secondary | ICD-10-CM | POA: Diagnosis not present

## 2021-12-09 DIAGNOSIS — Z4789 Encounter for other orthopedic aftercare: Secondary | ICD-10-CM | POA: Diagnosis not present

## 2021-12-09 DIAGNOSIS — L89324 Pressure ulcer of left buttock, stage 4: Secondary | ICD-10-CM | POA: Diagnosis not present

## 2021-12-09 DIAGNOSIS — L89224 Pressure ulcer of left hip, stage 4: Secondary | ICD-10-CM | POA: Diagnosis not present

## 2021-12-09 DIAGNOSIS — L89624 Pressure ulcer of left heel, stage 4: Secondary | ICD-10-CM | POA: Diagnosis not present

## 2021-12-11 DIAGNOSIS — L89624 Pressure ulcer of left heel, stage 4: Secondary | ICD-10-CM | POA: Diagnosis not present

## 2021-12-11 DIAGNOSIS — L89214 Pressure ulcer of right hip, stage 4: Secondary | ICD-10-CM | POA: Diagnosis not present

## 2021-12-11 DIAGNOSIS — L89314 Pressure ulcer of right buttock, stage 4: Secondary | ICD-10-CM | POA: Diagnosis not present

## 2021-12-11 DIAGNOSIS — L89324 Pressure ulcer of left buttock, stage 4: Secondary | ICD-10-CM | POA: Diagnosis not present

## 2021-12-11 DIAGNOSIS — L89224 Pressure ulcer of left hip, stage 4: Secondary | ICD-10-CM | POA: Diagnosis not present

## 2021-12-12 DIAGNOSIS — E43 Unspecified severe protein-calorie malnutrition: Secondary | ICD-10-CM | POA: Diagnosis not present

## 2021-12-12 DIAGNOSIS — G8220 Paraplegia, unspecified: Secondary | ICD-10-CM | POA: Diagnosis not present

## 2021-12-12 DIAGNOSIS — M86451 Chronic osteomyelitis with draining sinus, right femur: Secondary | ICD-10-CM | POA: Diagnosis not present

## 2021-12-12 DIAGNOSIS — Z4789 Encounter for other orthopedic aftercare: Secondary | ICD-10-CM | POA: Diagnosis not present

## 2021-12-12 DIAGNOSIS — L89892 Pressure ulcer of other site, stage 2: Secondary | ICD-10-CM | POA: Diagnosis not present

## 2021-12-12 DIAGNOSIS — Z452 Encounter for adjustment and management of vascular access device: Secondary | ICD-10-CM | POA: Diagnosis not present

## 2021-12-12 DIAGNOSIS — Z466 Encounter for fitting and adjustment of urinary device: Secondary | ICD-10-CM | POA: Diagnosis not present

## 2021-12-12 DIAGNOSIS — L89224 Pressure ulcer of left hip, stage 4: Secondary | ICD-10-CM | POA: Diagnosis not present

## 2021-12-12 DIAGNOSIS — L89624 Pressure ulcer of left heel, stage 4: Secondary | ICD-10-CM | POA: Diagnosis not present

## 2021-12-12 DIAGNOSIS — L89324 Pressure ulcer of left buttock, stage 4: Secondary | ICD-10-CM | POA: Diagnosis not present

## 2021-12-15 DIAGNOSIS — L89892 Pressure ulcer of other site, stage 2: Secondary | ICD-10-CM | POA: Diagnosis not present

## 2021-12-15 DIAGNOSIS — G8220 Paraplegia, unspecified: Secondary | ICD-10-CM | POA: Diagnosis not present

## 2021-12-15 DIAGNOSIS — E43 Unspecified severe protein-calorie malnutrition: Secondary | ICD-10-CM | POA: Diagnosis not present

## 2021-12-15 DIAGNOSIS — Z452 Encounter for adjustment and management of vascular access device: Secondary | ICD-10-CM | POA: Diagnosis not present

## 2021-12-15 DIAGNOSIS — L89224 Pressure ulcer of left hip, stage 4: Secondary | ICD-10-CM | POA: Diagnosis not present

## 2021-12-15 DIAGNOSIS — Z466 Encounter for fitting and adjustment of urinary device: Secondary | ICD-10-CM | POA: Diagnosis not present

## 2021-12-15 DIAGNOSIS — M86451 Chronic osteomyelitis with draining sinus, right femur: Secondary | ICD-10-CM | POA: Diagnosis not present

## 2021-12-15 DIAGNOSIS — Z4789 Encounter for other orthopedic aftercare: Secondary | ICD-10-CM | POA: Diagnosis not present

## 2021-12-15 DIAGNOSIS — L89324 Pressure ulcer of left buttock, stage 4: Secondary | ICD-10-CM | POA: Diagnosis not present

## 2021-12-15 DIAGNOSIS — L89624 Pressure ulcer of left heel, stage 4: Secondary | ICD-10-CM | POA: Diagnosis not present

## 2021-12-17 ENCOUNTER — Other Ambulatory Visit: Payer: Medicare Other | Admitting: Adult Health

## 2021-12-17 DIAGNOSIS — L89324 Pressure ulcer of left buttock, stage 4: Secondary | ICD-10-CM | POA: Diagnosis not present

## 2021-12-17 DIAGNOSIS — G8220 Paraplegia, unspecified: Secondary | ICD-10-CM | POA: Diagnosis not present

## 2021-12-17 DIAGNOSIS — L89224 Pressure ulcer of left hip, stage 4: Secondary | ICD-10-CM | POA: Diagnosis not present

## 2021-12-17 DIAGNOSIS — D638 Anemia in other chronic diseases classified elsewhere: Secondary | ICD-10-CM | POA: Diagnosis not present

## 2021-12-17 DIAGNOSIS — F17210 Nicotine dependence, cigarettes, uncomplicated: Secondary | ICD-10-CM | POA: Diagnosis not present

## 2021-12-17 DIAGNOSIS — I959 Hypotension, unspecified: Secondary | ICD-10-CM | POA: Diagnosis not present

## 2021-12-17 DIAGNOSIS — E43 Unspecified severe protein-calorie malnutrition: Secondary | ICD-10-CM | POA: Diagnosis not present

## 2021-12-17 DIAGNOSIS — L89624 Pressure ulcer of left heel, stage 4: Secondary | ICD-10-CM | POA: Diagnosis not present

## 2021-12-17 DIAGNOSIS — Z466 Encounter for fitting and adjustment of urinary device: Secondary | ICD-10-CM | POA: Diagnosis not present

## 2021-12-19 DIAGNOSIS — L89624 Pressure ulcer of left heel, stage 4: Secondary | ICD-10-CM | POA: Diagnosis not present

## 2021-12-19 DIAGNOSIS — L89324 Pressure ulcer of left buttock, stage 4: Secondary | ICD-10-CM | POA: Diagnosis not present

## 2021-12-19 DIAGNOSIS — F17210 Nicotine dependence, cigarettes, uncomplicated: Secondary | ICD-10-CM | POA: Diagnosis not present

## 2021-12-19 DIAGNOSIS — I959 Hypotension, unspecified: Secondary | ICD-10-CM | POA: Diagnosis not present

## 2021-12-19 DIAGNOSIS — G8220 Paraplegia, unspecified: Secondary | ICD-10-CM | POA: Diagnosis not present

## 2021-12-19 DIAGNOSIS — N319 Neuromuscular dysfunction of bladder, unspecified: Secondary | ICD-10-CM | POA: Diagnosis not present

## 2021-12-19 DIAGNOSIS — D638 Anemia in other chronic diseases classified elsewhere: Secondary | ICD-10-CM | POA: Diagnosis not present

## 2021-12-19 DIAGNOSIS — L89224 Pressure ulcer of left hip, stage 4: Secondary | ICD-10-CM | POA: Diagnosis not present

## 2021-12-19 DIAGNOSIS — E43 Unspecified severe protein-calorie malnutrition: Secondary | ICD-10-CM | POA: Diagnosis not present

## 2021-12-19 DIAGNOSIS — Z466 Encounter for fitting and adjustment of urinary device: Secondary | ICD-10-CM | POA: Diagnosis not present

## 2021-12-22 DIAGNOSIS — L89224 Pressure ulcer of left hip, stage 4: Secondary | ICD-10-CM | POA: Diagnosis not present

## 2021-12-22 DIAGNOSIS — G8220 Paraplegia, unspecified: Secondary | ICD-10-CM | POA: Diagnosis not present

## 2021-12-22 DIAGNOSIS — L89624 Pressure ulcer of left heel, stage 4: Secondary | ICD-10-CM | POA: Diagnosis not present

## 2021-12-22 DIAGNOSIS — I959 Hypotension, unspecified: Secondary | ICD-10-CM | POA: Diagnosis not present

## 2021-12-22 DIAGNOSIS — F17210 Nicotine dependence, cigarettes, uncomplicated: Secondary | ICD-10-CM | POA: Diagnosis not present

## 2021-12-22 DIAGNOSIS — Z466 Encounter for fitting and adjustment of urinary device: Secondary | ICD-10-CM | POA: Diagnosis not present

## 2021-12-22 DIAGNOSIS — D638 Anemia in other chronic diseases classified elsewhere: Secondary | ICD-10-CM | POA: Diagnosis not present

## 2021-12-22 DIAGNOSIS — L89324 Pressure ulcer of left buttock, stage 4: Secondary | ICD-10-CM | POA: Diagnosis not present

## 2021-12-22 DIAGNOSIS — E43 Unspecified severe protein-calorie malnutrition: Secondary | ICD-10-CM | POA: Diagnosis not present

## 2021-12-24 DIAGNOSIS — L89624 Pressure ulcer of left heel, stage 4: Secondary | ICD-10-CM | POA: Diagnosis not present

## 2021-12-24 DIAGNOSIS — E43 Unspecified severe protein-calorie malnutrition: Secondary | ICD-10-CM | POA: Diagnosis not present

## 2021-12-24 DIAGNOSIS — D638 Anemia in other chronic diseases classified elsewhere: Secondary | ICD-10-CM | POA: Diagnosis not present

## 2021-12-24 DIAGNOSIS — F17210 Nicotine dependence, cigarettes, uncomplicated: Secondary | ICD-10-CM | POA: Diagnosis not present

## 2021-12-24 DIAGNOSIS — Z03818 Encounter for observation for suspected exposure to other biological agents ruled out: Secondary | ICD-10-CM | POA: Diagnosis not present

## 2021-12-24 DIAGNOSIS — L89224 Pressure ulcer of left hip, stage 4: Secondary | ICD-10-CM | POA: Diagnosis not present

## 2021-12-24 DIAGNOSIS — G8220 Paraplegia, unspecified: Secondary | ICD-10-CM | POA: Diagnosis not present

## 2021-12-24 DIAGNOSIS — I959 Hypotension, unspecified: Secondary | ICD-10-CM | POA: Diagnosis not present

## 2021-12-24 DIAGNOSIS — T8131XA Disruption of external operation (surgical) wound, not elsewhere classified, initial encounter: Secondary | ICD-10-CM | POA: Diagnosis not present

## 2021-12-24 DIAGNOSIS — Z466 Encounter for fitting and adjustment of urinary device: Secondary | ICD-10-CM | POA: Diagnosis not present

## 2021-12-24 DIAGNOSIS — L89324 Pressure ulcer of left buttock, stage 4: Secondary | ICD-10-CM | POA: Diagnosis not present

## 2021-12-24 DIAGNOSIS — R339 Retention of urine, unspecified: Secondary | ICD-10-CM | POA: Diagnosis not present

## 2021-12-25 ENCOUNTER — Encounter: Payer: Self-pay | Admitting: Family

## 2021-12-25 ENCOUNTER — Telehealth (INDEPENDENT_AMBULATORY_CARE_PROVIDER_SITE_OTHER): Payer: Medicare Other | Admitting: Family

## 2021-12-25 ENCOUNTER — Ambulatory Visit: Payer: Medicare Other | Admitting: Podiatry

## 2021-12-25 DIAGNOSIS — M62838 Other muscle spasm: Secondary | ICD-10-CM | POA: Diagnosis not present

## 2021-12-25 DIAGNOSIS — L89624 Pressure ulcer of left heel, stage 4: Secondary | ICD-10-CM | POA: Diagnosis not present

## 2021-12-25 DIAGNOSIS — E43 Unspecified severe protein-calorie malnutrition: Secondary | ICD-10-CM

## 2021-12-25 DIAGNOSIS — Z993 Dependence on wheelchair: Secondary | ICD-10-CM

## 2021-12-25 DIAGNOSIS — M25551 Pain in right hip: Secondary | ICD-10-CM | POA: Diagnosis not present

## 2021-12-25 DIAGNOSIS — L89219 Pressure ulcer of right hip, unspecified stage: Secondary | ICD-10-CM | POA: Diagnosis not present

## 2021-12-25 MED ORDER — BACLOFEN 10 MG PO TABS: 10.0000 mg | ORAL_TABLET | Freq: Three times a day (TID) | ORAL | 3 refills | Status: DC

## 2021-12-25 NOTE — Progress Notes (Signed)
Virtual Visit Consent   [name], you are scheduled for a virtual visit with a [facility] provider today. Just as with appointments in the office, your consent must be obtained to participate. Your consent will be active for this visit and any virtual visit you may have with one of our providers in the next 365 days. If you have a MyChart account, a copy of this consent can be sent to you electronically.  As this is a virtual visit, video technology does not allow for your provider to perform a traditional examination. This may limit your provider's ability to fully assess your condition. If your provider identifies any concerns that need to be evaluated in person or the need to arrange testing (such as labs, EKG, etc.), we will make arrangements to do so. Although advances in technology are sophisticated, we cannot ensure that it will always work on either your end or our end. If the connection with a video visit is poor, the visit may have to be switched to a telephone visit. With either a video or telephone visit, we are not always able to ensure that we have a secure connection.  By engaging in this virtual visit, you consent to the provision of healthcare and authorize for your insurance to be billed (if applicable) for the services provided during this visit. Depending on your insurance coverage, you may receive a charge related to this service.  I need to obtain your verbal consent now. Are you willing to proceed with your visit today? Philisha L Wrisley has provided verbal consent on 12/25/2021 for a virtual visit (video or telephone). [name], FNP  Date: 12/25/2021 12:14 PM  Virtual Visit via Video Note   I, [name], connected with  PAIGE Kim [name]  ([id number], [birth year]) on 12/25/21 at 11:50 AM EDT by a video-enabled telemedicine application and verified that I am speaking with the correct person using two identifiers.  Location: Patient: Virtual Visit Location  Patient: Home Provider: Virtual Visit Location Provider: Office/Clinic   I discussed the limitations of evaluation and management by telemedicine and the availability of in person appointments. The patient expressed understanding and agreed to proceed.    History of Present Illness: Kim [name] is a 46 y.o. who identifies as a female who was assigned female at birth, and is being seen today for right hip pain. She had her hip replacement removed  She is bed ridden and keeps her hip on a wedge and in a "floating position".   She has cauda equina spinal cord injury and multiple pressure ulcers. She has a wound vac and is followed by wound care. She is also followed by suboxone clinic.   HPI: HPI  Problems:  Patient Active Problem List   Diagnosis Date Noted   Tobacco abuse 04/22/2021   Malnutrition of moderate degree 04/16/2021   Chronic osteomyelitis of left foot with draining sinus (HCC)    Severe protein-calorie malnutrition ([place]) 04/08/2021   Pressure injury of left hip, stage 3 ([place]) 03/04/2021   Pressure injury of contiguous region involving right buttock and hip, stage 4 ([place]) 02/10/2021   Pressure injury of left heel, stage 4 ([place]) 02/10/2021   Pressure injury of skin of right hip 01/28/2021   Non-healing open wound of heel, initial encounter 04/12/2020   At risk for falls 02/09/2018   Post-menopausal osteoporosis 02/09/2018   Loosening of prosthetic hip ([place]) 06/01/2016   Encounter for routine gynecological examination with Papanicolaou smear of cervix 03/03/2016  Enlarged uterus 03/03/2016   Postmenopausal 03/03/2016   GAD (generalized anxiety disorder) 01/21/2016   Dislocation of right hip ([place]) 08/01/2015   Status post right hip replacement 07/24/2015   Neurogenic bladder 06/28/2015   Height loss 05/13/2015   Wheelchair dependent 05/13/2015   History of developmental dysplasia of the hip 04/29/2015   Primary osteoarthritis of knees, bilateral 04/29/2015    Osteopenia    Vitamin D deficiency 05/01/2014   Heterotopic ossification of bone 08/02/2013   Hip pain, bilateral 08/02/2013   Osteoarthritis of left knee 08/02/2013   Cauda equina spinal cord injury ([place]) 06/16/2013   Low back pain 05/26/2011   Osteoarthritis of thoracolumbar spine 03/23/2011   Left knee pain 02/08/2011   KNEE PAIN 05/23/2007   SPONDYLOSIS WITH MYELOPATHY LUMBAR REGION 05/23/2007    Allergies:  Allergies  Allergen Reactions   Keflex [Cephalexin] Diarrhea   Medications:  Current Outpatient Medications:    baclofen (LIORESAL) 10 MG tablet, Take 1 tablet (10 mg total) by mouth 3 (three) times daily., Disp: 60 each, Rfl: 3   acetaminophen (TYLENOL) 650 MG suppository, Place 650 mg rectally every 4 (four) hours as needed., Disp: , Rfl:    amoxicillin (AMOXIL) 500 MG capsule, Take 4 capsules (2000 mg total) by mouth one hour prior to dental appointment, Disp: , Rfl:    Ascorbic Acid (VITAMIN C) 500 MG CAPS, Take 500 mg by mouth daily., Disp: 90 capsule, Rfl: 11   Buprenorphine HCl-Naloxone HCl 8-2 MG FILM, Place 1 Film under the tongue 3 (three) times daily., Disp: , Rfl:    CALCIUM-VITAMIN D PO, Take 1 tablet by mouth daily., Disp: , Rfl:    clonazePAM (KLONOPIN) 0.5 MG tablet, Take 0.5 mg by mouth 2 (two) times daily., Disp: , Rfl:    collagenase (SANTYL) ointment, Apply topically daily. (Patient not taking: Reported on 11/11/2021), Disp: 30 g, Rfl: 0   Elastic Bandages & Supports (HEEL/ANKLE PROTECTOR) MISC, 1 each by Does not apply route at bedtime., Disp: 2 each, Rfl: 1   ferrous sulfate 325 (65 FE) MG tablet, Take 1 tablet (325 mg total) by mouth 2 (two) times daily with a meal., Disp: 60 tablet, Rfl: 5   folic acid (FOLVITE) 1 MG tablet, Take 1 tablet (1 mg total) by mouth daily., Disp: 30 tablet, Rfl: 5   Foot Care Products (SOCK AID) MISC, 1 Device by Does not apply route daily., Disp: 1 each, Rfl: 0   gabapentin (NEURONTIN) 300 MG capsule, Take 2 capsules (600 mg  total) by mouth 2 (two) times daily., Disp: 120 capsule, Rfl: 2   midodrine (PROAMATINE) 10 MG tablet, Take 1 tablet (10 mg total) by mouth 3 (three) times daily with meals., Disp: 90 tablet, Rfl: 2   mirabegron ER (MYRBETRIQ) 25 MG TB24 tablet, 25 mg., Disp: , Rfl:    Misc. Devices (BED WEDGE) MISC, 1 each by Does not apply route 2 (two) times daily., Disp: 2 each, Rfl: 0   Misc. Devices (TRANSFER BENCH) MISC, 1 Device by Does not apply route as needed., Disp: 1 each, Rfl: 1   Misc. Devices [facility] CUSHION) MISC, 1 application by Does not apply route daily., Disp: 1 each, Rfl: 1   Multiple Vitamins-Calcium (ONE-A-DAY WOMENS PO), Take 2 tablets by mouth daily., Disp: , Rfl:    NARCAN 4 MG/0.1ML LIQD nasal spray kit, , Disp: , Rfl:    Nutritional Supplements (ENSURE HIGH PROTEIN) LIQD, Take 237 mLs by mouth 4 (four) times daily - after meals and  at bedtime., Disp: 3792 mL, Rfl: 11   Nutritional Supplements (JUVEN NUTRIVIGOR) PACK, Take 1 each by mouth 3 (three) times daily., Disp: 180 each, Rfl: 3   Nutritional Supplements (PROMOD) LIQD, Take 30 mLs by mouth 2 (two) times daily., Disp: 946 mL, Rfl: 11   nystatin (MYCOSTATIN/NYSTOP) powder, Apply 1 Application topically 3 (three) times daily., Disp: 60 g, Rfl: 2   Plecanatide (TRULANCE) 3 MG TABS, Take 3 mg by mouth daily., Disp: 90 tablet, Rfl: 1   polyethylene glycol powder (GLYCOLAX/MIRALAX) 17 GM/SCOOP powder, Take 17 g by mouth daily., Disp: 3350 g, Rfl: 1   POTASSIUM PO, Take 50 mg by mouth daily., Disp: , Rfl:    silver sulfADIAZINE (SILVADENE) 1 % cream, Apply 1 application topically daily., Disp: 50 g, Rfl: 0   sodium hypochlorite (DAKIN'S 1/2 STRENGTH) external solution, Apply to wounds as directed, Disp: , Rfl:    Zinc 50 MG CAPS, Take 1 capsule (50 mg total) by mouth daily., Disp: 90 capsule, Rfl: 1  Observations/Objective: Patient is well-developed, well-nourished in no acute distress.  Resting comfortably  at home.  Head is  normocephalic, atraumatic.  No labored breathing.  Speech is clear and coherent with logical content.  Patient is alert and oriented at baseline.  Laying in bed  Assessment and Plan: 1. Right hip pain - baclofen (LIORESAL) 10 MG tablet; Take 1 tablet (10 mg total) by mouth 3 (three) times daily.  Dispense: 60 each; Refill: 3  2. Muscle spasm - baclofen (LIORESAL) 10 MG tablet; Take 1 tablet (10 mg total) by mouth 3 (three) times daily.  Dispense: 60 each; Refill: 3  3. Pressure injury of left heel, stage 4 (HCC)  4. Severe protein-calorie malnutrition ([place])  5. Pressure injury of skin of right hip, unspecified injury stage  6. Wheelchair dependent  Baclofen Prescription sent to pharmacy  Continue with wound care and home health Keep chronic follow up  Follow Up Instructions: I discussed the assessment and treatment plan with the patient. The patient was provided an opportunity to ask questions and all were answered. The patient agreed with the plan and demonstrated an understanding of the instructions.  A copy of instructions were sent to the patient via MyChart unless otherwise noted below.    The patient was advised to call back or seek an in-person evaluation if the symptoms worsen or if the condition fails to improve as anticipated.  Time:  I spent 10 minutes with the patient via telehealth technology discussing the above problems/concerns.    [name], FNP

## 2021-12-26 DIAGNOSIS — G8220 Paraplegia, unspecified: Secondary | ICD-10-CM | POA: Diagnosis not present

## 2021-12-26 DIAGNOSIS — I959 Hypotension, unspecified: Secondary | ICD-10-CM | POA: Diagnosis not present

## 2021-12-26 DIAGNOSIS — L89324 Pressure ulcer of left buttock, stage 4: Secondary | ICD-10-CM | POA: Diagnosis not present

## 2021-12-26 DIAGNOSIS — Z466 Encounter for fitting and adjustment of urinary device: Secondary | ICD-10-CM | POA: Diagnosis not present

## 2021-12-26 DIAGNOSIS — L8944 Pressure ulcer of contiguous site of back, buttock and hip, stage 4: Secondary | ICD-10-CM | POA: Diagnosis not present

## 2021-12-26 DIAGNOSIS — L89624 Pressure ulcer of left heel, stage 4: Secondary | ICD-10-CM | POA: Diagnosis not present

## 2021-12-26 DIAGNOSIS — E43 Unspecified severe protein-calorie malnutrition: Secondary | ICD-10-CM | POA: Diagnosis not present

## 2021-12-26 DIAGNOSIS — D638 Anemia in other chronic diseases classified elsewhere: Secondary | ICD-10-CM | POA: Diagnosis not present

## 2021-12-26 DIAGNOSIS — L89224 Pressure ulcer of left hip, stage 4: Secondary | ICD-10-CM | POA: Diagnosis not present

## 2021-12-26 DIAGNOSIS — F17210 Nicotine dependence, cigarettes, uncomplicated: Secondary | ICD-10-CM | POA: Diagnosis not present

## 2021-12-29 DIAGNOSIS — G8220 Paraplegia, unspecified: Secondary | ICD-10-CM | POA: Diagnosis not present

## 2021-12-29 DIAGNOSIS — F17210 Nicotine dependence, cigarettes, uncomplicated: Secondary | ICD-10-CM | POA: Diagnosis not present

## 2021-12-29 DIAGNOSIS — L89224 Pressure ulcer of left hip, stage 4: Secondary | ICD-10-CM | POA: Diagnosis not present

## 2021-12-29 DIAGNOSIS — L89624 Pressure ulcer of left heel, stage 4: Secondary | ICD-10-CM | POA: Diagnosis not present

## 2021-12-29 DIAGNOSIS — D638 Anemia in other chronic diseases classified elsewhere: Secondary | ICD-10-CM | POA: Diagnosis not present

## 2021-12-29 DIAGNOSIS — L89324 Pressure ulcer of left buttock, stage 4: Secondary | ICD-10-CM | POA: Diagnosis not present

## 2021-12-29 DIAGNOSIS — I959 Hypotension, unspecified: Secondary | ICD-10-CM | POA: Diagnosis not present

## 2021-12-29 DIAGNOSIS — E43 Unspecified severe protein-calorie malnutrition: Secondary | ICD-10-CM | POA: Diagnosis not present

## 2021-12-29 DIAGNOSIS — Z466 Encounter for fitting and adjustment of urinary device: Secondary | ICD-10-CM | POA: Diagnosis not present

## 2021-12-31 DIAGNOSIS — Z466 Encounter for fitting and adjustment of urinary device: Secondary | ICD-10-CM | POA: Diagnosis not present

## 2021-12-31 DIAGNOSIS — L89224 Pressure ulcer of left hip, stage 4: Secondary | ICD-10-CM | POA: Diagnosis not present

## 2021-12-31 DIAGNOSIS — L89624 Pressure ulcer of left heel, stage 4: Secondary | ICD-10-CM | POA: Diagnosis not present

## 2021-12-31 DIAGNOSIS — E43 Unspecified severe protein-calorie malnutrition: Secondary | ICD-10-CM | POA: Diagnosis not present

## 2021-12-31 DIAGNOSIS — F17210 Nicotine dependence, cigarettes, uncomplicated: Secondary | ICD-10-CM | POA: Diagnosis not present

## 2021-12-31 DIAGNOSIS — L89324 Pressure ulcer of left buttock, stage 4: Secondary | ICD-10-CM | POA: Diagnosis not present

## 2021-12-31 DIAGNOSIS — G8220 Paraplegia, unspecified: Secondary | ICD-10-CM | POA: Diagnosis not present

## 2021-12-31 DIAGNOSIS — I959 Hypotension, unspecified: Secondary | ICD-10-CM | POA: Diagnosis not present

## 2021-12-31 DIAGNOSIS — D638 Anemia in other chronic diseases classified elsewhere: Secondary | ICD-10-CM | POA: Diagnosis not present

## 2022-01-01 DIAGNOSIS — Q6589 Other specified congenital deformities of hip: Secondary | ICD-10-CM | POA: Diagnosis not present

## 2022-01-01 DIAGNOSIS — Z9889 Other specified postprocedural states: Secondary | ICD-10-CM | POA: Diagnosis not present

## 2022-01-01 DIAGNOSIS — S72001D Fracture of unspecified part of neck of right femur, subsequent encounter for closed fracture with routine healing: Secondary | ICD-10-CM | POA: Diagnosis not present

## 2022-01-01 DIAGNOSIS — M8580 Other specified disorders of bone density and structure, unspecified site: Secondary | ICD-10-CM | POA: Diagnosis not present

## 2022-01-01 DIAGNOSIS — T8451XD Infection and inflammatory reaction due to internal right hip prosthesis, subsequent encounter: Secondary | ICD-10-CM | POA: Diagnosis not present

## 2022-01-01 DIAGNOSIS — M1612 Unilateral primary osteoarthritis, left hip: Secondary | ICD-10-CM | POA: Diagnosis not present

## 2022-01-02 DIAGNOSIS — Z466 Encounter for fitting and adjustment of urinary device: Secondary | ICD-10-CM | POA: Diagnosis not present

## 2022-01-02 DIAGNOSIS — L89324 Pressure ulcer of left buttock, stage 4: Secondary | ICD-10-CM | POA: Diagnosis not present

## 2022-01-02 DIAGNOSIS — F17210 Nicotine dependence, cigarettes, uncomplicated: Secondary | ICD-10-CM | POA: Diagnosis not present

## 2022-01-02 DIAGNOSIS — L89624 Pressure ulcer of left heel, stage 4: Secondary | ICD-10-CM | POA: Diagnosis not present

## 2022-01-02 DIAGNOSIS — E43 Unspecified severe protein-calorie malnutrition: Secondary | ICD-10-CM | POA: Diagnosis not present

## 2022-01-02 DIAGNOSIS — L89224 Pressure ulcer of left hip, stage 4: Secondary | ICD-10-CM | POA: Diagnosis not present

## 2022-01-02 DIAGNOSIS — D638 Anemia in other chronic diseases classified elsewhere: Secondary | ICD-10-CM | POA: Diagnosis not present

## 2022-01-02 DIAGNOSIS — I959 Hypotension, unspecified: Secondary | ICD-10-CM | POA: Diagnosis not present

## 2022-01-02 DIAGNOSIS — G8220 Paraplegia, unspecified: Secondary | ICD-10-CM | POA: Diagnosis not present

## 2022-01-05 DIAGNOSIS — L89624 Pressure ulcer of left heel, stage 4: Secondary | ICD-10-CM | POA: Diagnosis not present

## 2022-01-05 DIAGNOSIS — F17210 Nicotine dependence, cigarettes, uncomplicated: Secondary | ICD-10-CM | POA: Diagnosis not present

## 2022-01-05 DIAGNOSIS — I959 Hypotension, unspecified: Secondary | ICD-10-CM | POA: Diagnosis not present

## 2022-01-05 DIAGNOSIS — E43 Unspecified severe protein-calorie malnutrition: Secondary | ICD-10-CM | POA: Diagnosis not present

## 2022-01-05 DIAGNOSIS — D638 Anemia in other chronic diseases classified elsewhere: Secondary | ICD-10-CM | POA: Diagnosis not present

## 2022-01-05 DIAGNOSIS — L89324 Pressure ulcer of left buttock, stage 4: Secondary | ICD-10-CM | POA: Diagnosis not present

## 2022-01-05 DIAGNOSIS — L89224 Pressure ulcer of left hip, stage 4: Secondary | ICD-10-CM | POA: Diagnosis not present

## 2022-01-05 DIAGNOSIS — Z466 Encounter for fitting and adjustment of urinary device: Secondary | ICD-10-CM | POA: Diagnosis not present

## 2022-01-05 DIAGNOSIS — G8220 Paraplegia, unspecified: Secondary | ICD-10-CM | POA: Diagnosis not present

## 2022-01-08 DIAGNOSIS — L89314 Pressure ulcer of right buttock, stage 4: Secondary | ICD-10-CM | POA: Diagnosis not present

## 2022-01-08 DIAGNOSIS — L89324 Pressure ulcer of left buttock, stage 4: Secondary | ICD-10-CM | POA: Diagnosis not present

## 2022-01-08 DIAGNOSIS — F17210 Nicotine dependence, cigarettes, uncomplicated: Secondary | ICD-10-CM | POA: Diagnosis not present

## 2022-01-08 DIAGNOSIS — E46 Unspecified protein-calorie malnutrition: Secondary | ICD-10-CM | POA: Diagnosis not present

## 2022-01-08 DIAGNOSIS — D649 Anemia, unspecified: Secondary | ICD-10-CM | POA: Diagnosis not present

## 2022-01-08 DIAGNOSIS — L89624 Pressure ulcer of left heel, stage 4: Secondary | ICD-10-CM | POA: Diagnosis not present

## 2022-01-08 DIAGNOSIS — L98412 Non-pressure chronic ulcer of buttock with fat layer exposed: Secondary | ICD-10-CM | POA: Diagnosis not present

## 2022-01-12 DIAGNOSIS — Z466 Encounter for fitting and adjustment of urinary device: Secondary | ICD-10-CM | POA: Diagnosis not present

## 2022-01-12 DIAGNOSIS — D638 Anemia in other chronic diseases classified elsewhere: Secondary | ICD-10-CM | POA: Diagnosis not present

## 2022-01-12 DIAGNOSIS — F17210 Nicotine dependence, cigarettes, uncomplicated: Secondary | ICD-10-CM | POA: Diagnosis not present

## 2022-01-12 DIAGNOSIS — L89624 Pressure ulcer of left heel, stage 4: Secondary | ICD-10-CM | POA: Diagnosis not present

## 2022-01-12 DIAGNOSIS — G8220 Paraplegia, unspecified: Secondary | ICD-10-CM | POA: Diagnosis not present

## 2022-01-12 DIAGNOSIS — I959 Hypotension, unspecified: Secondary | ICD-10-CM | POA: Diagnosis not present

## 2022-01-12 DIAGNOSIS — L89224 Pressure ulcer of left hip, stage 4: Secondary | ICD-10-CM | POA: Diagnosis not present

## 2022-01-12 DIAGNOSIS — L89324 Pressure ulcer of left buttock, stage 4: Secondary | ICD-10-CM | POA: Diagnosis not present

## 2022-01-12 DIAGNOSIS — E43 Unspecified severe protein-calorie malnutrition: Secondary | ICD-10-CM | POA: Diagnosis not present

## 2022-01-14 DIAGNOSIS — E43 Unspecified severe protein-calorie malnutrition: Secondary | ICD-10-CM | POA: Diagnosis not present

## 2022-01-14 DIAGNOSIS — Z466 Encounter for fitting and adjustment of urinary device: Secondary | ICD-10-CM | POA: Diagnosis not present

## 2022-01-14 DIAGNOSIS — I959 Hypotension, unspecified: Secondary | ICD-10-CM | POA: Diagnosis not present

## 2022-01-14 DIAGNOSIS — L89324 Pressure ulcer of left buttock, stage 4: Secondary | ICD-10-CM | POA: Diagnosis not present

## 2022-01-14 DIAGNOSIS — L89624 Pressure ulcer of left heel, stage 4: Secondary | ICD-10-CM | POA: Diagnosis not present

## 2022-01-14 DIAGNOSIS — D638 Anemia in other chronic diseases classified elsewhere: Secondary | ICD-10-CM | POA: Diagnosis not present

## 2022-01-14 DIAGNOSIS — F17210 Nicotine dependence, cigarettes, uncomplicated: Secondary | ICD-10-CM | POA: Diagnosis not present

## 2022-01-14 DIAGNOSIS — L89224 Pressure ulcer of left hip, stage 4: Secondary | ICD-10-CM | POA: Diagnosis not present

## 2022-01-14 DIAGNOSIS — G8220 Paraplegia, unspecified: Secondary | ICD-10-CM | POA: Diagnosis not present

## 2022-01-16 DIAGNOSIS — F17210 Nicotine dependence, cigarettes, uncomplicated: Secondary | ICD-10-CM | POA: Diagnosis not present

## 2022-01-16 DIAGNOSIS — Z466 Encounter for fitting and adjustment of urinary device: Secondary | ICD-10-CM | POA: Diagnosis not present

## 2022-01-16 DIAGNOSIS — E43 Unspecified severe protein-calorie malnutrition: Secondary | ICD-10-CM | POA: Diagnosis not present

## 2022-01-16 DIAGNOSIS — L89624 Pressure ulcer of left heel, stage 4: Secondary | ICD-10-CM | POA: Diagnosis not present

## 2022-01-16 DIAGNOSIS — R339 Retention of urine, unspecified: Secondary | ICD-10-CM | POA: Diagnosis not present

## 2022-01-16 DIAGNOSIS — G8220 Paraplegia, unspecified: Secondary | ICD-10-CM | POA: Diagnosis not present

## 2022-01-16 DIAGNOSIS — I959 Hypotension, unspecified: Secondary | ICD-10-CM | POA: Diagnosis not present

## 2022-01-16 DIAGNOSIS — L89224 Pressure ulcer of left hip, stage 4: Secondary | ICD-10-CM | POA: Diagnosis not present

## 2022-01-16 DIAGNOSIS — D638 Anemia in other chronic diseases classified elsewhere: Secondary | ICD-10-CM | POA: Diagnosis not present

## 2022-01-16 DIAGNOSIS — T8131XA Disruption of external operation (surgical) wound, not elsewhere classified, initial encounter: Secondary | ICD-10-CM | POA: Diagnosis not present

## 2022-01-16 DIAGNOSIS — L89324 Pressure ulcer of left buttock, stage 4: Secondary | ICD-10-CM | POA: Diagnosis not present

## 2022-01-18 DIAGNOSIS — L89324 Pressure ulcer of left buttock, stage 4: Secondary | ICD-10-CM | POA: Diagnosis not present

## 2022-01-19 DIAGNOSIS — Z466 Encounter for fitting and adjustment of urinary device: Secondary | ICD-10-CM | POA: Diagnosis not present

## 2022-01-19 DIAGNOSIS — E43 Unspecified severe protein-calorie malnutrition: Secondary | ICD-10-CM | POA: Diagnosis not present

## 2022-01-19 DIAGNOSIS — L89624 Pressure ulcer of left heel, stage 4: Secondary | ICD-10-CM | POA: Diagnosis not present

## 2022-01-19 DIAGNOSIS — F17210 Nicotine dependence, cigarettes, uncomplicated: Secondary | ICD-10-CM | POA: Diagnosis not present

## 2022-01-19 DIAGNOSIS — L89224 Pressure ulcer of left hip, stage 4: Secondary | ICD-10-CM | POA: Diagnosis not present

## 2022-01-19 DIAGNOSIS — D638 Anemia in other chronic diseases classified elsewhere: Secondary | ICD-10-CM | POA: Diagnosis not present

## 2022-01-19 DIAGNOSIS — L89324 Pressure ulcer of left buttock, stage 4: Secondary | ICD-10-CM | POA: Diagnosis not present

## 2022-01-19 DIAGNOSIS — G8220 Paraplegia, unspecified: Secondary | ICD-10-CM | POA: Diagnosis not present

## 2022-01-19 DIAGNOSIS — I959 Hypotension, unspecified: Secondary | ICD-10-CM | POA: Diagnosis not present

## 2022-01-20 DIAGNOSIS — L89624 Pressure ulcer of left heel, stage 4: Secondary | ICD-10-CM | POA: Diagnosis not present

## 2022-01-20 DIAGNOSIS — L89224 Pressure ulcer of left hip, stage 4: Secondary | ICD-10-CM | POA: Diagnosis not present

## 2022-01-20 DIAGNOSIS — T8131XA Disruption of external operation (surgical) wound, not elsewhere classified, initial encounter: Secondary | ICD-10-CM | POA: Diagnosis not present

## 2022-01-20 DIAGNOSIS — R339 Retention of urine, unspecified: Secondary | ICD-10-CM | POA: Diagnosis not present

## 2022-01-21 DIAGNOSIS — F17210 Nicotine dependence, cigarettes, uncomplicated: Secondary | ICD-10-CM | POA: Diagnosis not present

## 2022-01-21 DIAGNOSIS — G8220 Paraplegia, unspecified: Secondary | ICD-10-CM | POA: Diagnosis not present

## 2022-01-21 DIAGNOSIS — E43 Unspecified severe protein-calorie malnutrition: Secondary | ICD-10-CM | POA: Diagnosis not present

## 2022-01-21 DIAGNOSIS — I959 Hypotension, unspecified: Secondary | ICD-10-CM | POA: Diagnosis not present

## 2022-01-21 DIAGNOSIS — D638 Anemia in other chronic diseases classified elsewhere: Secondary | ICD-10-CM | POA: Diagnosis not present

## 2022-01-21 DIAGNOSIS — Z466 Encounter for fitting and adjustment of urinary device: Secondary | ICD-10-CM | POA: Diagnosis not present

## 2022-01-21 DIAGNOSIS — L89224 Pressure ulcer of left hip, stage 4: Secondary | ICD-10-CM | POA: Diagnosis not present

## 2022-01-21 DIAGNOSIS — L89324 Pressure ulcer of left buttock, stage 4: Secondary | ICD-10-CM | POA: Diagnosis not present

## 2022-01-21 DIAGNOSIS — L89624 Pressure ulcer of left heel, stage 4: Secondary | ICD-10-CM | POA: Diagnosis not present

## 2022-01-21 DIAGNOSIS — Z03818 Encounter for observation for suspected exposure to other biological agents ruled out: Secondary | ICD-10-CM | POA: Diagnosis not present

## 2022-01-22 DIAGNOSIS — T148XXA Other injury of unspecified body region, initial encounter: Secondary | ICD-10-CM | POA: Diagnosis not present

## 2022-01-22 DIAGNOSIS — T8451XA Infection and inflammatory reaction due to internal right hip prosthesis, initial encounter: Secondary | ICD-10-CM | POA: Diagnosis not present

## 2022-01-22 DIAGNOSIS — G8220 Paraplegia, unspecified: Secondary | ICD-10-CM | POA: Diagnosis not present

## 2022-01-22 DIAGNOSIS — M868X7 Other osteomyelitis, ankle and foot: Secondary | ICD-10-CM | POA: Diagnosis not present

## 2022-01-22 DIAGNOSIS — N319 Neuromuscular dysfunction of bladder, unspecified: Secondary | ICD-10-CM | POA: Diagnosis not present

## 2022-01-22 DIAGNOSIS — Z96641 Presence of right artificial hip joint: Secondary | ICD-10-CM | POA: Diagnosis not present

## 2022-01-22 DIAGNOSIS — S343XXD Injury of cauda equina, subsequent encounter: Secondary | ICD-10-CM | POA: Diagnosis not present

## 2022-01-22 DIAGNOSIS — Z993 Dependence on wheelchair: Secondary | ICD-10-CM | POA: Diagnosis not present

## 2022-01-22 DIAGNOSIS — L089 Local infection of the skin and subcutaneous tissue, unspecified: Secondary | ICD-10-CM | POA: Diagnosis not present

## 2022-01-25 DIAGNOSIS — L89624 Pressure ulcer of left heel, stage 4: Secondary | ICD-10-CM | POA: Diagnosis not present

## 2022-01-25 DIAGNOSIS — L8944 Pressure ulcer of contiguous site of back, buttock and hip, stage 4: Secondary | ICD-10-CM | POA: Diagnosis not present

## 2022-01-26 DIAGNOSIS — E43 Unspecified severe protein-calorie malnutrition: Secondary | ICD-10-CM | POA: Diagnosis not present

## 2022-01-26 DIAGNOSIS — L89224 Pressure ulcer of left hip, stage 4: Secondary | ICD-10-CM | POA: Diagnosis not present

## 2022-01-26 DIAGNOSIS — L89324 Pressure ulcer of left buttock, stage 4: Secondary | ICD-10-CM | POA: Diagnosis not present

## 2022-01-26 DIAGNOSIS — Z466 Encounter for fitting and adjustment of urinary device: Secondary | ICD-10-CM | POA: Diagnosis not present

## 2022-01-26 DIAGNOSIS — I959 Hypotension, unspecified: Secondary | ICD-10-CM | POA: Diagnosis not present

## 2022-01-26 DIAGNOSIS — G8220 Paraplegia, unspecified: Secondary | ICD-10-CM | POA: Diagnosis not present

## 2022-01-26 DIAGNOSIS — D638 Anemia in other chronic diseases classified elsewhere: Secondary | ICD-10-CM | POA: Diagnosis not present

## 2022-01-26 DIAGNOSIS — L89624 Pressure ulcer of left heel, stage 4: Secondary | ICD-10-CM | POA: Diagnosis not present

## 2022-01-26 DIAGNOSIS — F17210 Nicotine dependence, cigarettes, uncomplicated: Secondary | ICD-10-CM | POA: Diagnosis not present

## 2022-01-27 DIAGNOSIS — L89224 Pressure ulcer of left hip, stage 4: Secondary | ICD-10-CM | POA: Diagnosis not present

## 2022-01-27 DIAGNOSIS — L89324 Pressure ulcer of left buttock, stage 4: Secondary | ICD-10-CM | POA: Diagnosis not present

## 2022-01-27 DIAGNOSIS — L89314 Pressure ulcer of right buttock, stage 4: Secondary | ICD-10-CM | POA: Diagnosis not present

## 2022-01-27 DIAGNOSIS — L89214 Pressure ulcer of right hip, stage 4: Secondary | ICD-10-CM | POA: Diagnosis not present

## 2022-01-28 DIAGNOSIS — Z466 Encounter for fitting and adjustment of urinary device: Secondary | ICD-10-CM | POA: Diagnosis not present

## 2022-01-28 DIAGNOSIS — D638 Anemia in other chronic diseases classified elsewhere: Secondary | ICD-10-CM | POA: Diagnosis not present

## 2022-01-28 DIAGNOSIS — L89224 Pressure ulcer of left hip, stage 4: Secondary | ICD-10-CM | POA: Diagnosis not present

## 2022-01-28 DIAGNOSIS — L89624 Pressure ulcer of left heel, stage 4: Secondary | ICD-10-CM | POA: Diagnosis not present

## 2022-01-28 DIAGNOSIS — I959 Hypotension, unspecified: Secondary | ICD-10-CM | POA: Diagnosis not present

## 2022-01-28 DIAGNOSIS — E43 Unspecified severe protein-calorie malnutrition: Secondary | ICD-10-CM | POA: Diagnosis not present

## 2022-01-28 DIAGNOSIS — G8220 Paraplegia, unspecified: Secondary | ICD-10-CM | POA: Diagnosis not present

## 2022-01-28 DIAGNOSIS — F17210 Nicotine dependence, cigarettes, uncomplicated: Secondary | ICD-10-CM | POA: Diagnosis not present

## 2022-01-28 DIAGNOSIS — L89324 Pressure ulcer of left buttock, stage 4: Secondary | ICD-10-CM | POA: Diagnosis not present

## 2022-01-30 DIAGNOSIS — L89624 Pressure ulcer of left heel, stage 4: Secondary | ICD-10-CM | POA: Diagnosis not present

## 2022-01-30 DIAGNOSIS — L89224 Pressure ulcer of left hip, stage 4: Secondary | ICD-10-CM | POA: Diagnosis not present

## 2022-01-30 DIAGNOSIS — E43 Unspecified severe protein-calorie malnutrition: Secondary | ICD-10-CM | POA: Diagnosis not present

## 2022-01-30 DIAGNOSIS — D638 Anemia in other chronic diseases classified elsewhere: Secondary | ICD-10-CM | POA: Diagnosis not present

## 2022-01-30 DIAGNOSIS — Z466 Encounter for fitting and adjustment of urinary device: Secondary | ICD-10-CM | POA: Diagnosis not present

## 2022-01-30 DIAGNOSIS — F17210 Nicotine dependence, cigarettes, uncomplicated: Secondary | ICD-10-CM | POA: Diagnosis not present

## 2022-01-30 DIAGNOSIS — I959 Hypotension, unspecified: Secondary | ICD-10-CM | POA: Diagnosis not present

## 2022-01-30 DIAGNOSIS — L89324 Pressure ulcer of left buttock, stage 4: Secondary | ICD-10-CM | POA: Diagnosis not present

## 2022-01-30 DIAGNOSIS — G8220 Paraplegia, unspecified: Secondary | ICD-10-CM | POA: Diagnosis not present

## 2022-02-02 DIAGNOSIS — L89224 Pressure ulcer of left hip, stage 4: Secondary | ICD-10-CM | POA: Diagnosis not present

## 2022-02-02 DIAGNOSIS — D638 Anemia in other chronic diseases classified elsewhere: Secondary | ICD-10-CM | POA: Diagnosis not present

## 2022-02-02 DIAGNOSIS — Z466 Encounter for fitting and adjustment of urinary device: Secondary | ICD-10-CM | POA: Diagnosis not present

## 2022-02-02 DIAGNOSIS — I959 Hypotension, unspecified: Secondary | ICD-10-CM | POA: Diagnosis not present

## 2022-02-02 DIAGNOSIS — F17210 Nicotine dependence, cigarettes, uncomplicated: Secondary | ICD-10-CM | POA: Diagnosis not present

## 2022-02-02 DIAGNOSIS — G8220 Paraplegia, unspecified: Secondary | ICD-10-CM | POA: Diagnosis not present

## 2022-02-02 DIAGNOSIS — L89324 Pressure ulcer of left buttock, stage 4: Secondary | ICD-10-CM | POA: Diagnosis not present

## 2022-02-02 DIAGNOSIS — L89624 Pressure ulcer of left heel, stage 4: Secondary | ICD-10-CM | POA: Diagnosis not present

## 2022-02-02 DIAGNOSIS — E43 Unspecified severe protein-calorie malnutrition: Secondary | ICD-10-CM | POA: Diagnosis not present

## 2022-02-04 DIAGNOSIS — L89224 Pressure ulcer of left hip, stage 4: Secondary | ICD-10-CM | POA: Diagnosis not present

## 2022-02-04 DIAGNOSIS — D638 Anemia in other chronic diseases classified elsewhere: Secondary | ICD-10-CM | POA: Diagnosis not present

## 2022-02-04 DIAGNOSIS — F17210 Nicotine dependence, cigarettes, uncomplicated: Secondary | ICD-10-CM | POA: Diagnosis not present

## 2022-02-04 DIAGNOSIS — Z466 Encounter for fitting and adjustment of urinary device: Secondary | ICD-10-CM | POA: Diagnosis not present

## 2022-02-04 DIAGNOSIS — L89324 Pressure ulcer of left buttock, stage 4: Secondary | ICD-10-CM | POA: Diagnosis not present

## 2022-02-04 DIAGNOSIS — I959 Hypotension, unspecified: Secondary | ICD-10-CM | POA: Diagnosis not present

## 2022-02-04 DIAGNOSIS — L89624 Pressure ulcer of left heel, stage 4: Secondary | ICD-10-CM | POA: Diagnosis not present

## 2022-02-04 DIAGNOSIS — G8220 Paraplegia, unspecified: Secondary | ICD-10-CM | POA: Diagnosis not present

## 2022-02-04 DIAGNOSIS — E43 Unspecified severe protein-calorie malnutrition: Secondary | ICD-10-CM | POA: Diagnosis not present

## 2022-02-05 DIAGNOSIS — F17210 Nicotine dependence, cigarettes, uncomplicated: Secondary | ICD-10-CM | POA: Diagnosis not present

## 2022-02-05 DIAGNOSIS — E46 Unspecified protein-calorie malnutrition: Secondary | ICD-10-CM | POA: Diagnosis not present

## 2022-02-05 DIAGNOSIS — Z79899 Other long term (current) drug therapy: Secondary | ICD-10-CM | POA: Diagnosis not present

## 2022-02-05 DIAGNOSIS — L89314 Pressure ulcer of right buttock, stage 4: Secondary | ICD-10-CM | POA: Diagnosis not present

## 2022-02-05 DIAGNOSIS — L89214 Pressure ulcer of right hip, stage 4: Secondary | ICD-10-CM | POA: Diagnosis not present

## 2022-02-05 DIAGNOSIS — L89224 Pressure ulcer of left hip, stage 4: Secondary | ICD-10-CM | POA: Diagnosis not present

## 2022-02-05 DIAGNOSIS — L89624 Pressure ulcer of left heel, stage 4: Secondary | ICD-10-CM | POA: Diagnosis not present

## 2022-02-05 DIAGNOSIS — D649 Anemia, unspecified: Secondary | ICD-10-CM | POA: Diagnosis not present

## 2022-02-09 DIAGNOSIS — L89324 Pressure ulcer of left buttock, stage 4: Secondary | ICD-10-CM | POA: Diagnosis not present

## 2022-02-09 DIAGNOSIS — Z466 Encounter for fitting and adjustment of urinary device: Secondary | ICD-10-CM | POA: Diagnosis not present

## 2022-02-09 DIAGNOSIS — L89224 Pressure ulcer of left hip, stage 4: Secondary | ICD-10-CM | POA: Diagnosis not present

## 2022-02-09 DIAGNOSIS — E43 Unspecified severe protein-calorie malnutrition: Secondary | ICD-10-CM | POA: Diagnosis not present

## 2022-02-09 DIAGNOSIS — D638 Anemia in other chronic diseases classified elsewhere: Secondary | ICD-10-CM | POA: Diagnosis not present

## 2022-02-09 DIAGNOSIS — L89624 Pressure ulcer of left heel, stage 4: Secondary | ICD-10-CM | POA: Diagnosis not present

## 2022-02-09 DIAGNOSIS — G8220 Paraplegia, unspecified: Secondary | ICD-10-CM | POA: Diagnosis not present

## 2022-02-09 DIAGNOSIS — I959 Hypotension, unspecified: Secondary | ICD-10-CM | POA: Diagnosis not present

## 2022-02-09 DIAGNOSIS — F17210 Nicotine dependence, cigarettes, uncomplicated: Secondary | ICD-10-CM | POA: Diagnosis not present

## 2022-02-11 DIAGNOSIS — L89624 Pressure ulcer of left heel, stage 4: Secondary | ICD-10-CM | POA: Diagnosis not present

## 2022-02-11 DIAGNOSIS — G8220 Paraplegia, unspecified: Secondary | ICD-10-CM | POA: Diagnosis not present

## 2022-02-11 DIAGNOSIS — E43 Unspecified severe protein-calorie malnutrition: Secondary | ICD-10-CM | POA: Diagnosis not present

## 2022-02-11 DIAGNOSIS — L89324 Pressure ulcer of left buttock, stage 4: Secondary | ICD-10-CM | POA: Diagnosis not present

## 2022-02-11 DIAGNOSIS — D638 Anemia in other chronic diseases classified elsewhere: Secondary | ICD-10-CM | POA: Diagnosis not present

## 2022-02-11 DIAGNOSIS — Z466 Encounter for fitting and adjustment of urinary device: Secondary | ICD-10-CM | POA: Diagnosis not present

## 2022-02-11 DIAGNOSIS — I959 Hypotension, unspecified: Secondary | ICD-10-CM | POA: Diagnosis not present

## 2022-02-11 DIAGNOSIS — F17210 Nicotine dependence, cigarettes, uncomplicated: Secondary | ICD-10-CM | POA: Diagnosis not present

## 2022-02-11 DIAGNOSIS — L89224 Pressure ulcer of left hip, stage 4: Secondary | ICD-10-CM | POA: Diagnosis not present

## 2022-02-12 DIAGNOSIS — H5213 Myopia, bilateral: Secondary | ICD-10-CM | POA: Diagnosis not present

## 2022-02-13 DIAGNOSIS — L89624 Pressure ulcer of left heel, stage 4: Secondary | ICD-10-CM | POA: Diagnosis not present

## 2022-02-13 DIAGNOSIS — G8220 Paraplegia, unspecified: Secondary | ICD-10-CM | POA: Diagnosis not present

## 2022-02-13 DIAGNOSIS — Z466 Encounter for fitting and adjustment of urinary device: Secondary | ICD-10-CM | POA: Diagnosis not present

## 2022-02-13 DIAGNOSIS — D638 Anemia in other chronic diseases classified elsewhere: Secondary | ICD-10-CM | POA: Diagnosis not present

## 2022-02-13 DIAGNOSIS — E43 Unspecified severe protein-calorie malnutrition: Secondary | ICD-10-CM | POA: Diagnosis not present

## 2022-02-13 DIAGNOSIS — L89224 Pressure ulcer of left hip, stage 4: Secondary | ICD-10-CM | POA: Diagnosis not present

## 2022-02-13 DIAGNOSIS — I959 Hypotension, unspecified: Secondary | ICD-10-CM | POA: Diagnosis not present

## 2022-02-13 DIAGNOSIS — F17210 Nicotine dependence, cigarettes, uncomplicated: Secondary | ICD-10-CM | POA: Diagnosis not present

## 2022-02-13 DIAGNOSIS — L89324 Pressure ulcer of left buttock, stage 4: Secondary | ICD-10-CM | POA: Diagnosis not present

## 2022-02-16 DIAGNOSIS — Z4800 Encounter for change or removal of nonsurgical wound dressing: Secondary | ICD-10-CM | POA: Diagnosis not present

## 2022-02-16 DIAGNOSIS — Z466 Encounter for fitting and adjustment of urinary device: Secondary | ICD-10-CM | POA: Diagnosis not present

## 2022-02-16 DIAGNOSIS — L89324 Pressure ulcer of left buttock, stage 4: Secondary | ICD-10-CM | POA: Diagnosis not present

## 2022-02-16 DIAGNOSIS — D638 Anemia in other chronic diseases classified elsewhere: Secondary | ICD-10-CM | POA: Diagnosis not present

## 2022-02-16 DIAGNOSIS — M2450 Contracture, unspecified joint: Secondary | ICD-10-CM | POA: Diagnosis not present

## 2022-02-16 DIAGNOSIS — L89314 Pressure ulcer of right buttock, stage 4: Secondary | ICD-10-CM | POA: Diagnosis not present

## 2022-02-16 DIAGNOSIS — F17210 Nicotine dependence, cigarettes, uncomplicated: Secondary | ICD-10-CM | POA: Diagnosis not present

## 2022-02-16 DIAGNOSIS — I959 Hypotension, unspecified: Secondary | ICD-10-CM | POA: Diagnosis not present

## 2022-02-16 DIAGNOSIS — G8220 Paraplegia, unspecified: Secondary | ICD-10-CM | POA: Diagnosis not present

## 2022-02-17 DIAGNOSIS — R339 Retention of urine, unspecified: Secondary | ICD-10-CM | POA: Diagnosis not present

## 2022-02-17 DIAGNOSIS — L89324 Pressure ulcer of left buttock, stage 4: Secondary | ICD-10-CM | POA: Diagnosis not present

## 2022-02-17 DIAGNOSIS — L89624 Pressure ulcer of left heel, stage 4: Secondary | ICD-10-CM | POA: Diagnosis not present

## 2022-02-17 DIAGNOSIS — T8131XA Disruption of external operation (surgical) wound, not elsewhere classified, initial encounter: Secondary | ICD-10-CM | POA: Diagnosis not present

## 2022-02-17 DIAGNOSIS — L89224 Pressure ulcer of left hip, stage 4: Secondary | ICD-10-CM | POA: Diagnosis not present

## 2022-02-18 DIAGNOSIS — I959 Hypotension, unspecified: Secondary | ICD-10-CM | POA: Diagnosis not present

## 2022-02-18 DIAGNOSIS — Z4800 Encounter for change or removal of nonsurgical wound dressing: Secondary | ICD-10-CM | POA: Diagnosis not present

## 2022-02-18 DIAGNOSIS — F17210 Nicotine dependence, cigarettes, uncomplicated: Secondary | ICD-10-CM | POA: Diagnosis not present

## 2022-02-18 DIAGNOSIS — L89324 Pressure ulcer of left buttock, stage 4: Secondary | ICD-10-CM | POA: Diagnosis not present

## 2022-02-18 DIAGNOSIS — D638 Anemia in other chronic diseases classified elsewhere: Secondary | ICD-10-CM | POA: Diagnosis not present

## 2022-02-18 DIAGNOSIS — L89314 Pressure ulcer of right buttock, stage 4: Secondary | ICD-10-CM | POA: Diagnosis not present

## 2022-02-18 DIAGNOSIS — M2450 Contracture, unspecified joint: Secondary | ICD-10-CM | POA: Diagnosis not present

## 2022-02-18 DIAGNOSIS — Z466 Encounter for fitting and adjustment of urinary device: Secondary | ICD-10-CM | POA: Diagnosis not present

## 2022-02-18 DIAGNOSIS — G8220 Paraplegia, unspecified: Secondary | ICD-10-CM | POA: Diagnosis not present

## 2022-02-19 DIAGNOSIS — M8618 Other acute osteomyelitis, other site: Secondary | ICD-10-CM | POA: Diagnosis not present

## 2022-02-19 DIAGNOSIS — L89314 Pressure ulcer of right buttock, stage 4: Secondary | ICD-10-CM | POA: Diagnosis not present

## 2022-02-19 DIAGNOSIS — L89324 Pressure ulcer of left buttock, stage 4: Secondary | ICD-10-CM | POA: Diagnosis not present

## 2022-02-19 DIAGNOSIS — L89624 Pressure ulcer of left heel, stage 4: Secondary | ICD-10-CM | POA: Diagnosis not present

## 2022-02-23 DIAGNOSIS — M2450 Contracture, unspecified joint: Secondary | ICD-10-CM | POA: Diagnosis not present

## 2022-02-23 DIAGNOSIS — F17210 Nicotine dependence, cigarettes, uncomplicated: Secondary | ICD-10-CM | POA: Diagnosis not present

## 2022-02-23 DIAGNOSIS — Z4800 Encounter for change or removal of nonsurgical wound dressing: Secondary | ICD-10-CM | POA: Diagnosis not present

## 2022-02-23 DIAGNOSIS — L89324 Pressure ulcer of left buttock, stage 4: Secondary | ICD-10-CM | POA: Diagnosis not present

## 2022-02-23 DIAGNOSIS — Z466 Encounter for fitting and adjustment of urinary device: Secondary | ICD-10-CM | POA: Diagnosis not present

## 2022-02-23 DIAGNOSIS — D638 Anemia in other chronic diseases classified elsewhere: Secondary | ICD-10-CM | POA: Diagnosis not present

## 2022-02-23 DIAGNOSIS — L89314 Pressure ulcer of right buttock, stage 4: Secondary | ICD-10-CM | POA: Diagnosis not present

## 2022-02-23 DIAGNOSIS — G8220 Paraplegia, unspecified: Secondary | ICD-10-CM | POA: Diagnosis not present

## 2022-02-23 DIAGNOSIS — I959 Hypotension, unspecified: Secondary | ICD-10-CM | POA: Diagnosis not present

## 2022-02-25 ENCOUNTER — Other Ambulatory Visit: Payer: Self-pay | Admitting: Family

## 2022-02-27 DIAGNOSIS — L89314 Pressure ulcer of right buttock, stage 4: Secondary | ICD-10-CM | POA: Diagnosis not present

## 2022-02-27 DIAGNOSIS — D638 Anemia in other chronic diseases classified elsewhere: Secondary | ICD-10-CM | POA: Diagnosis not present

## 2022-02-27 DIAGNOSIS — M2450 Contracture, unspecified joint: Secondary | ICD-10-CM | POA: Diagnosis not present

## 2022-02-27 DIAGNOSIS — G8220 Paraplegia, unspecified: Secondary | ICD-10-CM | POA: Diagnosis not present

## 2022-02-27 DIAGNOSIS — L89324 Pressure ulcer of left buttock, stage 4: Secondary | ICD-10-CM | POA: Diagnosis not present

## 2022-02-27 DIAGNOSIS — F17210 Nicotine dependence, cigarettes, uncomplicated: Secondary | ICD-10-CM | POA: Diagnosis not present

## 2022-02-27 DIAGNOSIS — Z4800 Encounter for change or removal of nonsurgical wound dressing: Secondary | ICD-10-CM | POA: Diagnosis not present

## 2022-02-27 DIAGNOSIS — I959 Hypotension, unspecified: Secondary | ICD-10-CM | POA: Diagnosis not present

## 2022-02-27 DIAGNOSIS — Z466 Encounter for fitting and adjustment of urinary device: Secondary | ICD-10-CM | POA: Diagnosis not present

## 2022-03-02 ENCOUNTER — Ambulatory Visit (INDEPENDENT_AMBULATORY_CARE_PROVIDER_SITE_OTHER): Payer: Medicare Other

## 2022-03-02 DIAGNOSIS — G8220 Paraplegia, unspecified: Secondary | ICD-10-CM

## 2022-03-02 DIAGNOSIS — M25561 Pain in right knee: Secondary | ICD-10-CM | POA: Diagnosis not present

## 2022-03-02 DIAGNOSIS — L89324 Pressure ulcer of left buttock, stage 4: Secondary | ICD-10-CM | POA: Diagnosis not present

## 2022-03-02 DIAGNOSIS — Z4800 Encounter for change or removal of nonsurgical wound dressing: Secondary | ICD-10-CM

## 2022-03-02 DIAGNOSIS — Z466 Encounter for fitting and adjustment of urinary device: Secondary | ICD-10-CM

## 2022-03-02 DIAGNOSIS — D638 Anemia in other chronic diseases classified elsewhere: Secondary | ICD-10-CM

## 2022-03-02 DIAGNOSIS — R269 Unspecified abnormalities of gait and mobility: Secondary | ICD-10-CM | POA: Diagnosis not present

## 2022-03-02 DIAGNOSIS — M25551 Pain in right hip: Secondary | ICD-10-CM | POA: Diagnosis not present

## 2022-03-02 DIAGNOSIS — M2450 Contracture, unspecified joint: Secondary | ICD-10-CM | POA: Diagnosis not present

## 2022-03-02 DIAGNOSIS — I959 Hypotension, unspecified: Secondary | ICD-10-CM | POA: Diagnosis not present

## 2022-03-02 DIAGNOSIS — F17210 Nicotine dependence, cigarettes, uncomplicated: Secondary | ICD-10-CM

## 2022-03-02 DIAGNOSIS — L89314 Pressure ulcer of right buttock, stage 4: Secondary | ICD-10-CM | POA: Diagnosis not present

## 2022-03-02 DIAGNOSIS — F1290 Cannabis use, unspecified, uncomplicated: Secondary | ICD-10-CM

## 2022-03-02 DIAGNOSIS — S343XXS Injury of cauda equina, sequela: Secondary | ICD-10-CM | POA: Diagnosis not present

## 2022-03-05 DIAGNOSIS — L89324 Pressure ulcer of left buttock, stage 4: Secondary | ICD-10-CM | POA: Diagnosis not present

## 2022-03-05 DIAGNOSIS — L89314 Pressure ulcer of right buttock, stage 4: Secondary | ICD-10-CM | POA: Diagnosis not present

## 2022-03-05 DIAGNOSIS — L89224 Pressure ulcer of left hip, stage 4: Secondary | ICD-10-CM | POA: Diagnosis not present

## 2022-03-05 DIAGNOSIS — L89214 Pressure ulcer of right hip, stage 4: Secondary | ICD-10-CM | POA: Diagnosis not present

## 2022-03-05 DIAGNOSIS — L89624 Pressure ulcer of left heel, stage 4: Secondary | ICD-10-CM | POA: Diagnosis not present

## 2022-03-05 DIAGNOSIS — M869 Osteomyelitis, unspecified: Secondary | ICD-10-CM | POA: Diagnosis not present

## 2022-03-06 DIAGNOSIS — L89324 Pressure ulcer of left buttock, stage 4: Secondary | ICD-10-CM | POA: Diagnosis not present

## 2022-03-06 DIAGNOSIS — L89314 Pressure ulcer of right buttock, stage 4: Secondary | ICD-10-CM | POA: Diagnosis not present

## 2022-03-06 DIAGNOSIS — G8220 Paraplegia, unspecified: Secondary | ICD-10-CM | POA: Diagnosis not present

## 2022-03-09 DIAGNOSIS — M2450 Contracture, unspecified joint: Secondary | ICD-10-CM | POA: Diagnosis not present

## 2022-03-09 DIAGNOSIS — G8220 Paraplegia, unspecified: Secondary | ICD-10-CM | POA: Diagnosis not present

## 2022-03-09 DIAGNOSIS — I959 Hypotension, unspecified: Secondary | ICD-10-CM | POA: Diagnosis not present

## 2022-03-09 DIAGNOSIS — F17210 Nicotine dependence, cigarettes, uncomplicated: Secondary | ICD-10-CM | POA: Diagnosis not present

## 2022-03-09 DIAGNOSIS — D638 Anemia in other chronic diseases classified elsewhere: Secondary | ICD-10-CM | POA: Diagnosis not present

## 2022-03-09 DIAGNOSIS — Z466 Encounter for fitting and adjustment of urinary device: Secondary | ICD-10-CM | POA: Diagnosis not present

## 2022-03-09 DIAGNOSIS — L89314 Pressure ulcer of right buttock, stage 4: Secondary | ICD-10-CM | POA: Diagnosis not present

## 2022-03-09 DIAGNOSIS — L89324 Pressure ulcer of left buttock, stage 4: Secondary | ICD-10-CM | POA: Diagnosis not present

## 2022-03-09 DIAGNOSIS — Z4800 Encounter for change or removal of nonsurgical wound dressing: Secondary | ICD-10-CM | POA: Diagnosis not present

## 2022-03-11 DIAGNOSIS — Z4800 Encounter for change or removal of nonsurgical wound dressing: Secondary | ICD-10-CM | POA: Diagnosis not present

## 2022-03-11 DIAGNOSIS — Z466 Encounter for fitting and adjustment of urinary device: Secondary | ICD-10-CM | POA: Diagnosis not present

## 2022-03-11 DIAGNOSIS — D638 Anemia in other chronic diseases classified elsewhere: Secondary | ICD-10-CM | POA: Diagnosis not present

## 2022-03-11 DIAGNOSIS — M2450 Contracture, unspecified joint: Secondary | ICD-10-CM | POA: Diagnosis not present

## 2022-03-11 DIAGNOSIS — F17210 Nicotine dependence, cigarettes, uncomplicated: Secondary | ICD-10-CM | POA: Diagnosis not present

## 2022-03-11 DIAGNOSIS — I959 Hypotension, unspecified: Secondary | ICD-10-CM | POA: Diagnosis not present

## 2022-03-11 DIAGNOSIS — L89314 Pressure ulcer of right buttock, stage 4: Secondary | ICD-10-CM | POA: Diagnosis not present

## 2022-03-11 DIAGNOSIS — L89324 Pressure ulcer of left buttock, stage 4: Secondary | ICD-10-CM | POA: Diagnosis not present

## 2022-03-11 DIAGNOSIS — G8220 Paraplegia, unspecified: Secondary | ICD-10-CM | POA: Diagnosis not present

## 2022-03-13 DIAGNOSIS — Z466 Encounter for fitting and adjustment of urinary device: Secondary | ICD-10-CM | POA: Diagnosis not present

## 2022-03-13 DIAGNOSIS — F17210 Nicotine dependence, cigarettes, uncomplicated: Secondary | ICD-10-CM | POA: Diagnosis not present

## 2022-03-13 DIAGNOSIS — Z4800 Encounter for change or removal of nonsurgical wound dressing: Secondary | ICD-10-CM | POA: Diagnosis not present

## 2022-03-13 DIAGNOSIS — M2450 Contracture, unspecified joint: Secondary | ICD-10-CM | POA: Diagnosis not present

## 2022-03-13 DIAGNOSIS — D638 Anemia in other chronic diseases classified elsewhere: Secondary | ICD-10-CM | POA: Diagnosis not present

## 2022-03-13 DIAGNOSIS — G8220 Paraplegia, unspecified: Secondary | ICD-10-CM | POA: Diagnosis not present

## 2022-03-13 DIAGNOSIS — L89314 Pressure ulcer of right buttock, stage 4: Secondary | ICD-10-CM | POA: Diagnosis not present

## 2022-03-13 DIAGNOSIS — L89324 Pressure ulcer of left buttock, stage 4: Secondary | ICD-10-CM | POA: Diagnosis not present

## 2022-03-13 DIAGNOSIS — I959 Hypotension, unspecified: Secondary | ICD-10-CM | POA: Diagnosis not present

## 2022-03-15 DIAGNOSIS — L89324 Pressure ulcer of left buttock, stage 4: Secondary | ICD-10-CM | POA: Diagnosis not present

## 2022-03-15 DIAGNOSIS — I959 Hypotension, unspecified: Secondary | ICD-10-CM | POA: Diagnosis not present

## 2022-03-15 DIAGNOSIS — L89314 Pressure ulcer of right buttock, stage 4: Secondary | ICD-10-CM | POA: Diagnosis not present

## 2022-03-15 DIAGNOSIS — F17210 Nicotine dependence, cigarettes, uncomplicated: Secondary | ICD-10-CM | POA: Diagnosis not present

## 2022-03-15 DIAGNOSIS — M2450 Contracture, unspecified joint: Secondary | ICD-10-CM | POA: Diagnosis not present

## 2022-03-15 DIAGNOSIS — Z4800 Encounter for change or removal of nonsurgical wound dressing: Secondary | ICD-10-CM | POA: Diagnosis not present

## 2022-03-15 DIAGNOSIS — D638 Anemia in other chronic diseases classified elsewhere: Secondary | ICD-10-CM | POA: Diagnosis not present

## 2022-03-15 DIAGNOSIS — Z466 Encounter for fitting and adjustment of urinary device: Secondary | ICD-10-CM | POA: Diagnosis not present

## 2022-03-15 DIAGNOSIS — G8220 Paraplegia, unspecified: Secondary | ICD-10-CM | POA: Diagnosis not present

## 2022-03-19 DIAGNOSIS — M869 Osteomyelitis, unspecified: Secondary | ICD-10-CM | POA: Diagnosis not present

## 2022-03-19 DIAGNOSIS — H52223 Regular astigmatism, bilateral: Secondary | ICD-10-CM | POA: Diagnosis not present

## 2022-03-19 DIAGNOSIS — H524 Presbyopia: Secondary | ICD-10-CM | POA: Diagnosis not present

## 2022-03-19 DIAGNOSIS — E46 Unspecified protein-calorie malnutrition: Secondary | ICD-10-CM | POA: Diagnosis not present

## 2022-03-19 DIAGNOSIS — L89324 Pressure ulcer of left buttock, stage 4: Secondary | ICD-10-CM | POA: Diagnosis not present

## 2022-03-19 DIAGNOSIS — D649 Anemia, unspecified: Secondary | ICD-10-CM | POA: Diagnosis not present

## 2022-03-19 DIAGNOSIS — F17210 Nicotine dependence, cigarettes, uncomplicated: Secondary | ICD-10-CM | POA: Diagnosis not present

## 2022-03-19 DIAGNOSIS — L89314 Pressure ulcer of right buttock, stage 4: Secondary | ICD-10-CM | POA: Diagnosis not present

## 2022-03-19 DIAGNOSIS — L89214 Pressure ulcer of right hip, stage 4: Secondary | ICD-10-CM | POA: Diagnosis not present

## 2022-03-19 DIAGNOSIS — L89224 Pressure ulcer of left hip, stage 4: Secondary | ICD-10-CM | POA: Diagnosis not present

## 2022-03-23 DIAGNOSIS — I959 Hypotension, unspecified: Secondary | ICD-10-CM | POA: Diagnosis not present

## 2022-03-23 DIAGNOSIS — G8220 Paraplegia, unspecified: Secondary | ICD-10-CM | POA: Diagnosis not present

## 2022-03-23 DIAGNOSIS — M2450 Contracture, unspecified joint: Secondary | ICD-10-CM | POA: Diagnosis not present

## 2022-03-23 DIAGNOSIS — L89324 Pressure ulcer of left buttock, stage 4: Secondary | ICD-10-CM | POA: Diagnosis not present

## 2022-03-23 DIAGNOSIS — D638 Anemia in other chronic diseases classified elsewhere: Secondary | ICD-10-CM | POA: Diagnosis not present

## 2022-03-23 DIAGNOSIS — F17210 Nicotine dependence, cigarettes, uncomplicated: Secondary | ICD-10-CM | POA: Diagnosis not present

## 2022-03-23 DIAGNOSIS — L89314 Pressure ulcer of right buttock, stage 4: Secondary | ICD-10-CM | POA: Diagnosis not present

## 2022-03-23 DIAGNOSIS — Z466 Encounter for fitting and adjustment of urinary device: Secondary | ICD-10-CM | POA: Diagnosis not present

## 2022-03-23 DIAGNOSIS — Z4800 Encounter for change or removal of nonsurgical wound dressing: Secondary | ICD-10-CM | POA: Diagnosis not present

## 2022-03-25 DIAGNOSIS — I959 Hypotension, unspecified: Secondary | ICD-10-CM | POA: Diagnosis not present

## 2022-03-25 DIAGNOSIS — M2450 Contracture, unspecified joint: Secondary | ICD-10-CM | POA: Diagnosis not present

## 2022-03-25 DIAGNOSIS — G8220 Paraplegia, unspecified: Secondary | ICD-10-CM | POA: Diagnosis not present

## 2022-03-25 DIAGNOSIS — F17210 Nicotine dependence, cigarettes, uncomplicated: Secondary | ICD-10-CM | POA: Diagnosis not present

## 2022-03-25 DIAGNOSIS — Z4800 Encounter for change or removal of nonsurgical wound dressing: Secondary | ICD-10-CM | POA: Diagnosis not present

## 2022-03-25 DIAGNOSIS — L89324 Pressure ulcer of left buttock, stage 4: Secondary | ICD-10-CM | POA: Diagnosis not present

## 2022-03-25 DIAGNOSIS — L89314 Pressure ulcer of right buttock, stage 4: Secondary | ICD-10-CM | POA: Diagnosis not present

## 2022-03-25 DIAGNOSIS — D638 Anemia in other chronic diseases classified elsewhere: Secondary | ICD-10-CM | POA: Diagnosis not present

## 2022-03-25 DIAGNOSIS — Z466 Encounter for fitting and adjustment of urinary device: Secondary | ICD-10-CM | POA: Diagnosis not present

## 2022-03-27 DIAGNOSIS — F17210 Nicotine dependence, cigarettes, uncomplicated: Secondary | ICD-10-CM | POA: Diagnosis not present

## 2022-03-27 DIAGNOSIS — L89314 Pressure ulcer of right buttock, stage 4: Secondary | ICD-10-CM | POA: Diagnosis not present

## 2022-03-27 DIAGNOSIS — D638 Anemia in other chronic diseases classified elsewhere: Secondary | ICD-10-CM | POA: Diagnosis not present

## 2022-03-27 DIAGNOSIS — Z466 Encounter for fitting and adjustment of urinary device: Secondary | ICD-10-CM | POA: Diagnosis not present

## 2022-03-27 DIAGNOSIS — M2450 Contracture, unspecified joint: Secondary | ICD-10-CM | POA: Diagnosis not present

## 2022-03-27 DIAGNOSIS — Z4800 Encounter for change or removal of nonsurgical wound dressing: Secondary | ICD-10-CM | POA: Diagnosis not present

## 2022-03-27 DIAGNOSIS — L89324 Pressure ulcer of left buttock, stage 4: Secondary | ICD-10-CM | POA: Diagnosis not present

## 2022-03-27 DIAGNOSIS — I959 Hypotension, unspecified: Secondary | ICD-10-CM | POA: Diagnosis not present

## 2022-03-27 DIAGNOSIS — G8220 Paraplegia, unspecified: Secondary | ICD-10-CM | POA: Diagnosis not present

## 2022-03-28 DIAGNOSIS — L89324 Pressure ulcer of left buttock, stage 4: Secondary | ICD-10-CM | POA: Diagnosis not present

## 2022-03-28 DIAGNOSIS — M2450 Contracture, unspecified joint: Secondary | ICD-10-CM | POA: Diagnosis not present

## 2022-03-28 DIAGNOSIS — Z4800 Encounter for change or removal of nonsurgical wound dressing: Secondary | ICD-10-CM | POA: Diagnosis not present

## 2022-03-28 DIAGNOSIS — D638 Anemia in other chronic diseases classified elsewhere: Secondary | ICD-10-CM | POA: Diagnosis not present

## 2022-03-28 DIAGNOSIS — L89314 Pressure ulcer of right buttock, stage 4: Secondary | ICD-10-CM | POA: Diagnosis not present

## 2022-03-28 DIAGNOSIS — F17210 Nicotine dependence, cigarettes, uncomplicated: Secondary | ICD-10-CM | POA: Diagnosis not present

## 2022-03-28 DIAGNOSIS — Z466 Encounter for fitting and adjustment of urinary device: Secondary | ICD-10-CM | POA: Diagnosis not present

## 2022-03-28 DIAGNOSIS — I959 Hypotension, unspecified: Secondary | ICD-10-CM | POA: Diagnosis not present

## 2022-03-28 DIAGNOSIS — G8220 Paraplegia, unspecified: Secondary | ICD-10-CM | POA: Diagnosis not present

## 2022-03-31 ENCOUNTER — Telehealth (INDEPENDENT_AMBULATORY_CARE_PROVIDER_SITE_OTHER): Payer: Medicare Other | Admitting: Family

## 2022-03-31 ENCOUNTER — Encounter: Payer: Self-pay | Admitting: Family

## 2022-03-31 DIAGNOSIS — G8220 Paraplegia, unspecified: Secondary | ICD-10-CM | POA: Diagnosis not present

## 2022-03-31 DIAGNOSIS — G6289 Other specified polyneuropathies: Secondary | ICD-10-CM

## 2022-03-31 DIAGNOSIS — Z466 Encounter for fitting and adjustment of urinary device: Secondary | ICD-10-CM | POA: Diagnosis not present

## 2022-03-31 DIAGNOSIS — Z4800 Encounter for change or removal of nonsurgical wound dressing: Secondary | ICD-10-CM | POA: Diagnosis not present

## 2022-03-31 DIAGNOSIS — I959 Hypotension, unspecified: Secondary | ICD-10-CM | POA: Diagnosis not present

## 2022-03-31 DIAGNOSIS — R159 Full incontinence of feces: Secondary | ICD-10-CM | POA: Diagnosis not present

## 2022-03-31 DIAGNOSIS — M2450 Contracture, unspecified joint: Secondary | ICD-10-CM | POA: Diagnosis not present

## 2022-03-31 DIAGNOSIS — D638 Anemia in other chronic diseases classified elsewhere: Secondary | ICD-10-CM | POA: Diagnosis not present

## 2022-03-31 DIAGNOSIS — R197 Diarrhea, unspecified: Secondary | ICD-10-CM

## 2022-03-31 DIAGNOSIS — L89314 Pressure ulcer of right buttock, stage 4: Secondary | ICD-10-CM | POA: Diagnosis not present

## 2022-03-31 DIAGNOSIS — F17210 Nicotine dependence, cigarettes, uncomplicated: Secondary | ICD-10-CM | POA: Diagnosis not present

## 2022-03-31 DIAGNOSIS — L89324 Pressure ulcer of left buttock, stage 4: Secondary | ICD-10-CM | POA: Diagnosis not present

## 2022-03-31 MED ORDER — MIDODRINE HCL 10 MG PO TABS: 10.0000 mg | ORAL_TABLET | Freq: Three times a day (TID) | ORAL | 2 refills | Status: DC

## 2022-03-31 MED ORDER — GABAPENTIN 300 MG PO CAPS: ORAL_CAPSULE | ORAL | 2 refills | Status: DC

## 2022-03-31 NOTE — Patient Instructions (Signed)
Diarrhea, Adult Diarrhea is frequent loose and sometimes watery bowel movements. Diarrhea can make you feel weak and cause you to become dehydrated. Dehydration is a condition in which there is not enough water or other fluids in the body. Dehydration can make you tired and thirsty, cause you to have a dry mouth, and decrease how often you urinate. Diarrhea typically lasts 2-3 days. However, it can last longer if it is a sign of something more serious. It is important to treat your diarrhea as told by your health care provider. Follow these instructions at home: Eating and drinking     Follow these recommendations as told by your health care provider: Take an oral rehydration solution (ORS). This is an over-the-counter medicine that helps return your body to its normal balance of nutrients and water. It is found at pharmacies and retail stores. Drink enough fluid to keep your urine pale yellow. Drink fluids such as water, diluted fruit juice, and low-calorie sports drinks. You can drink milk also, if desired. Sucking on ice chips is another way to get fluids. Avoid drinking fluids that contain a lot of sugar or caffeine, such as soda, energy drinks, and regular sports drinks. Avoid alcohol. Eat bland, easy-to-digest foods in small amounts as you are able. These foods include bananas, applesauce, rice, lean meats, toast, and crackers. Avoid spicy or fatty foods.  Medicines Take over-the-counter and prescription medicines only as told by your health care provider. If you were prescribed antibiotics, take them as told by your health care provider. Do not stop using the antibiotic even if you start to feel better. General instructions  Wash your hands often using soap and water for at least 20 seconds. If soap and water are not available, use hand sanitizer. Others in the household should wash their hands as well. Hands should be washed: After using the toilet or changing a diaper. Before  preparing, cooking, or serving food. While caring for a sick person or while visiting someone in a hospital. Rest at home while you recover. Take a warm bath to relieve any burning or pain from frequent diarrhea episodes. Watch your condition for any changes. Contact a health care provider if: You have a fever. Your diarrhea gets worse. You have new symptoms. You vomit every time you eat or drink. You feel light-headed, dizzy, or have a headache. You have muscle cramps. You have signs of dehydration, such as: Dark urine, very little urine, or no urine. Cracked lips. Dry mouth. Sunken eyes. Sleepiness. Weakness. You have bloody or black stools or stools that look like tar. You have severe pain, cramping, or bloating in your abdomen. Your skin feels cold and clammy. You feel confused. Get help right away if: You have chest pain or your heart is beating very quickly. You have trouble breathing or you are breathing very quickly. You feel extremely weak or you faint. These symptoms may be an emergency. Get help right away. Call 911. Do not wait to see if the symptoms will go away. Do not drive yourself to the hospital. This information is not intended to replace advice given to you by your health care provider. Make sure you discuss any questions you have with your health care provider. Document Revised: 09/09/2021 Document Reviewed: 09/09/2021 Elsevier Patient Education  2023 Elsevier Inc.  

## 2022-03-31 NOTE — Progress Notes (Signed)
Virtual Visit Consent   PAIDYN MCFERRAN, you are scheduled for a virtual visit with a [facility] provider today. Just as with appointments in the office, your consent must be obtained to participate. Your consent will be active for this visit and any virtual visit you may have with one of our providers in the next 365 days. If you have a MyChart account, a copy of this consent can be sent to you electronically.  As this is a virtual visit, video technology does not allow for your provider to perform a traditional examination. This may limit your provider's ability to fully assess your condition. If your provider identifies any concerns that need to be evaluated in person or the need to arrange testing (such as labs, EKG, etc.), we will make arrangements to do so. Although advances in technology are sophisticated, we cannot ensure that it will always work on either your end or our end. If the connection with a video visit is poor, the visit may have to be switched to a telephone visit. With either a video or telephone visit, we are not always able to ensure that we have a secure connection.  By engaging in this virtual visit, you consent to the provision of healthcare and authorize for your insurance to be billed (if applicable) for the services provided during this visit. Depending on your insurance coverage, you may receive a charge related to this service.  I need to obtain your verbal consent now. Are you willing to proceed with your visit today? Melisssa L Kadow has provided verbal consent on 03/31/2022 for a virtual visit (video or telephone). [name], FNP  Date: 03/31/2022 10:16 AM  Virtual Visit via Video Note   I, [name], connected with  ANYELINA CLAYCOMB  ([id number], 1976-01-20) on 03/31/22 at  9:20 AM EST by a video-enabled telemedicine application and verified that I am speaking with the correct person using two identifiers.  Location: Patient: Virtual Visit Location  Patient: Home Provider: Virtual Visit Location Provider: Home Office   I discussed the limitations of evaluation and management by telemedicine and the availability of in person appointments. The patient expressed understanding and agreed to proceed.    History of Present Illness: Kim [name] is a 46 y.o. who identifies as a female who was assigned female at birth, and is being seen today for diarrhea. She has a wound vac and this is causing problems. She is in a new bed and harder to get out of and causing stool incontinence. She is on antibiotics.   She has peripheral neuropathy in left leg of 7 out 10 with burning. Takes gabapentin that helps.   She also has hypotension and takes midodrine TID.   HPI: Diarrhea  This is a new problem. The current episode started in the past 7 days. The problem occurs 2 to 4 times per day. The problem has been unchanged. Pertinent negatives include no bloating, chills, coughing, fever, increased  flatus, myalgias, URI or vomiting.    Problems:  Patient Active Problem List   Diagnosis Date Noted   Tobacco abuse 04/22/2021   Malnutrition of moderate degree 04/16/2021   Chronic osteomyelitis of left foot with draining sinus (HCC)    Severe protein-calorie malnutrition ([place]) 04/08/2021   Pressure injury of left hip, stage 3 ([place]) 03/04/2021   Pressure injury of contiguous region involving right buttock and hip, stage 4 ([place]) 02/10/2021   Pressure injury of left heel, stage 4 ([place]) 02/10/2021   Pressure  injury of skin of right hip 01/28/2021   Non-healing open wound of heel, initial encounter 04/12/2020   At risk for falls 02/09/2018   Post-menopausal osteoporosis 02/09/2018   Loosening of prosthetic hip ([place]) 06/01/2016   Encounter for routine gynecological examination with Papanicolaou smear of cervix 03/03/2016   Enlarged uterus 03/03/2016   Postmenopausal 03/03/2016   GAD (generalized anxiety disorder) 01/21/2016   Dislocation of right hip  ([place]) 08/01/2015   Status post right hip replacement 07/24/2015   Neurogenic bladder 06/28/2015   Height loss 05/13/2015   Wheelchair dependent 05/13/2015   History of developmental dysplasia of the hip 04/29/2015   Primary osteoarthritis of knees, bilateral 04/29/2015   Osteopenia    Vitamin D deficiency 05/01/2014   Heterotopic ossification of bone 08/02/2013   Hip pain, bilateral 08/02/2013   Osteoarthritis of left knee 08/02/2013   Cauda equina spinal cord injury ([place]) 06/16/2013   Low back pain 05/26/2011   Osteoarthritis of thoracolumbar spine 03/23/2011   Left knee pain 02/08/2011   KNEE PAIN 05/23/2007   SPONDYLOSIS WITH MYELOPATHY LUMBAR REGION 05/23/2007    Allergies:  Allergies  Allergen Reactions   Keflex [Cephalexin] Diarrhea   Medications:  Current Outpatient Medications:    acetaminophen (TYLENOL) 650 MG suppository, Place 650 mg rectally every 4 (four) hours as needed., Disp: , Rfl:    amoxicillin (AMOXIL) 500 MG capsule, Take 4 capsules (2000 mg total) by mouth one hour prior to dental appointment, Disp: , Rfl:    Ascorbic Acid (VITAMIN C) 500 MG CAPS, Take 500 mg by mouth daily., Disp: 90 capsule, Rfl: 11   baclofen (LIORESAL) 10 MG tablet, Take 1 tablet (10 mg total) by mouth 3 (three) times daily., Disp: 60 each, Rfl: 3   Buprenorphine HCl-Naloxone HCl 8-2 MG FILM, Place 1 Film under the tongue 3 (three) times daily., Disp: , Rfl:    CALCIUM-VITAMIN D PO, Take 1 tablet by mouth daily., Disp: , Rfl:    clonazePAM (KLONOPIN) 0.5 MG tablet, Take 0.5 mg by mouth 2 (two) times daily., Disp: , Rfl:    collagenase (SANTYL) ointment, Apply topically daily. (Patient not taking: Reported on 11/11/2021), Disp: 30 g, Rfl: 0   Elastic Bandages & Supports (HEEL/ANKLE PROTECTOR) MISC, 1 each by Does not apply route at bedtime., Disp: 2 each, Rfl: 1   ferrous sulfate 325 (65 FE) MG tablet, Take 1 tablet (325 mg total) by mouth 2 (two) times daily with a meal., Disp: 60 tablet,  Rfl: 5   folic acid (FOLVITE) 1 MG tablet, Take 1 tablet (1 mg total) by mouth daily., Disp: 30 tablet, Rfl: 5   Foot Care Products (SOCK AID) MISC, 1 Device by Does not apply route daily., Disp: 1 each, Rfl: 0   gabapentin (NEURONTIN) 300 MG capsule, TAKE (2) CAPSULES BY MOUTH TWICE DAILY., Disp: 120 capsule, Rfl: 2   midodrine (PROAMATINE) 10 MG tablet, Take 1 tablet (10 mg total) by mouth 3 (three) times daily with meals., Disp: 90 tablet, Rfl: 2   mirabegron ER (MYRBETRIQ) 25 MG TB24 tablet, 25 mg., Disp: , Rfl:    Misc. Devices (BED WEDGE) MISC, 1 each by Does not apply route 2 (two) times daily., Disp: 2 each, Rfl: 0   Misc. Devices (TRANSFER BENCH) MISC, 1 Device by Does not apply route as needed., Disp: 1 each, Rfl: 1   Misc. Devices [facility] CUSHION) MISC, 1 application by Does not apply route daily., Disp: 1 each, Rfl: 1   Multiple Vitamins-Calcium (ONE-A-DAY  WOMENS PO), Take 2 tablets by mouth daily., Disp: , Rfl:    NARCAN 4 MG/0.1ML LIQD nasal spray kit, , Disp: , Rfl:    Nutritional Supplements (ENSURE HIGH PROTEIN) LIQD, Take 237 mLs by mouth 4 (four) times daily - after meals and at bedtime., Disp: 3792 mL, Rfl: 11   Nutritional Supplements (JUVEN NUTRIVIGOR) PACK, Take 1 each by mouth 3 (three) times daily., Disp: 180 each, Rfl: 3   Nutritional Supplements (PROMOD) LIQD, Take 30 mLs by mouth 2 (two) times daily., Disp: 946 mL, Rfl: 11   nystatin (MYCOSTATIN/NYSTOP) powder, Apply 1 Application topically 3 (three) times daily., Disp: 60 g, Rfl: 2   Plecanatide (TRULANCE) 3 MG TABS, Take 3 mg by mouth daily., Disp: 90 tablet, Rfl: 1   polyethylene glycol powder (GLYCOLAX/MIRALAX) 17 GM/SCOOP powder, Take 17 g by mouth daily., Disp: 3350 g, Rfl: 1   POTASSIUM PO, Take 50 mg by mouth daily., Disp: , Rfl:    silver sulfADIAZINE (SILVADENE) 1 % cream, Apply 1 application topically daily., Disp: 50 g, Rfl: 0   sodium hypochlorite (DAKIN'S 1/2 STRENGTH) external solution, Apply to  wounds as directed, Disp: , Rfl:    Zinc 50 MG CAPS, Take 1 capsule (50 mg total) by mouth daily., Disp: 90 capsule, Rfl: 1  Observations/Objective: Patient is well-developed, well-nourished in no acute distress.  Resting comfortably  at home.  Head is normocephalic, atraumatic.  No labored breathing.  Speech is clear and coherent with logical content.  Patient is alert and oriented at baseline.    Assessment and Plan: 1. Diarrhea, unspecified type  2. Incontinence of feces, unspecified fecal incontinence type  3. Other polyneuropathy - gabapentin (NEURONTIN) 300 MG capsule; TAKE (2) CAPSULES BY MOUTH TWICE DAILY.  Dispense: 120 capsule; Refill: 2  4. Hypotension, unspecified hypotension type - midodrine (PROAMATINE) 10 MG tablet; Take 1 tablet (10 mg total) by mouth 3 (three) times daily with meals.  Dispense: 90 tablet; Refill: 2  Imodium as needed  Force fluids Continue medications  Continue with Home health and PT Follow up as needed  Follow Up Instructions: I discussed the assessment and treatment plan with the patient. The patient was provided an opportunity to ask questions and all were answered. The patient agreed with the plan and demonstrated an understanding of the instructions.  A copy of instructions were sent to the patient via MyChart unless otherwise noted below.     The patient was advised to call back or seek an in-person evaluation if the symptoms worsen or if the condition fails to improve as anticipated.  Time:  I spent 12 minutes with the patient via telehealth technology discussing the above problems/concerns.    [name], FNP

## 2022-04-03 DIAGNOSIS — Z4800 Encounter for change or removal of nonsurgical wound dressing: Secondary | ICD-10-CM | POA: Diagnosis not present

## 2022-04-03 DIAGNOSIS — I959 Hypotension, unspecified: Secondary | ICD-10-CM | POA: Diagnosis not present

## 2022-04-03 DIAGNOSIS — L89314 Pressure ulcer of right buttock, stage 4: Secondary | ICD-10-CM | POA: Diagnosis not present

## 2022-04-03 DIAGNOSIS — M2450 Contracture, unspecified joint: Secondary | ICD-10-CM | POA: Diagnosis not present

## 2022-04-03 DIAGNOSIS — D638 Anemia in other chronic diseases classified elsewhere: Secondary | ICD-10-CM | POA: Diagnosis not present

## 2022-04-03 DIAGNOSIS — F17210 Nicotine dependence, cigarettes, uncomplicated: Secondary | ICD-10-CM | POA: Diagnosis not present

## 2022-04-03 DIAGNOSIS — G8220 Paraplegia, unspecified: Secondary | ICD-10-CM | POA: Diagnosis not present

## 2022-04-03 DIAGNOSIS — Z466 Encounter for fitting and adjustment of urinary device: Secondary | ICD-10-CM | POA: Diagnosis not present

## 2022-04-03 DIAGNOSIS — L89324 Pressure ulcer of left buttock, stage 4: Secondary | ICD-10-CM | POA: Diagnosis not present

## 2022-04-06 DIAGNOSIS — G8220 Paraplegia, unspecified: Secondary | ICD-10-CM | POA: Diagnosis not present

## 2022-04-06 DIAGNOSIS — L89314 Pressure ulcer of right buttock, stage 4: Secondary | ICD-10-CM | POA: Diagnosis not present

## 2022-04-06 DIAGNOSIS — L89324 Pressure ulcer of left buttock, stage 4: Secondary | ICD-10-CM | POA: Diagnosis not present

## 2022-04-07 DIAGNOSIS — D638 Anemia in other chronic diseases classified elsewhere: Secondary | ICD-10-CM | POA: Diagnosis not present

## 2022-04-07 DIAGNOSIS — I959 Hypotension, unspecified: Secondary | ICD-10-CM | POA: Diagnosis not present

## 2022-04-07 DIAGNOSIS — L89324 Pressure ulcer of left buttock, stage 4: Secondary | ICD-10-CM | POA: Diagnosis not present

## 2022-04-07 DIAGNOSIS — Z466 Encounter for fitting and adjustment of urinary device: Secondary | ICD-10-CM | POA: Diagnosis not present

## 2022-04-07 DIAGNOSIS — G8220 Paraplegia, unspecified: Secondary | ICD-10-CM | POA: Diagnosis not present

## 2022-04-07 DIAGNOSIS — Z4800 Encounter for change or removal of nonsurgical wound dressing: Secondary | ICD-10-CM | POA: Diagnosis not present

## 2022-04-07 DIAGNOSIS — M2450 Contracture, unspecified joint: Secondary | ICD-10-CM | POA: Diagnosis not present

## 2022-04-07 DIAGNOSIS — L89314 Pressure ulcer of right buttock, stage 4: Secondary | ICD-10-CM | POA: Diagnosis not present

## 2022-04-07 DIAGNOSIS — F17210 Nicotine dependence, cigarettes, uncomplicated: Secondary | ICD-10-CM | POA: Diagnosis not present

## 2022-04-10 DIAGNOSIS — I959 Hypotension, unspecified: Secondary | ICD-10-CM | POA: Diagnosis not present

## 2022-04-10 DIAGNOSIS — M2450 Contracture, unspecified joint: Secondary | ICD-10-CM | POA: Diagnosis not present

## 2022-04-10 DIAGNOSIS — G8220 Paraplegia, unspecified: Secondary | ICD-10-CM | POA: Diagnosis not present

## 2022-04-10 DIAGNOSIS — Z466 Encounter for fitting and adjustment of urinary device: Secondary | ICD-10-CM | POA: Diagnosis not present

## 2022-04-10 DIAGNOSIS — Z4800 Encounter for change or removal of nonsurgical wound dressing: Secondary | ICD-10-CM | POA: Diagnosis not present

## 2022-04-10 DIAGNOSIS — F17210 Nicotine dependence, cigarettes, uncomplicated: Secondary | ICD-10-CM | POA: Diagnosis not present

## 2022-04-10 DIAGNOSIS — D638 Anemia in other chronic diseases classified elsewhere: Secondary | ICD-10-CM | POA: Diagnosis not present

## 2022-04-10 DIAGNOSIS — L89324 Pressure ulcer of left buttock, stage 4: Secondary | ICD-10-CM | POA: Diagnosis not present

## 2022-04-10 DIAGNOSIS — L89314 Pressure ulcer of right buttock, stage 4: Secondary | ICD-10-CM | POA: Diagnosis not present

## 2022-04-13 DIAGNOSIS — Z466 Encounter for fitting and adjustment of urinary device: Secondary | ICD-10-CM | POA: Diagnosis not present

## 2022-04-13 DIAGNOSIS — L89324 Pressure ulcer of left buttock, stage 4: Secondary | ICD-10-CM | POA: Diagnosis not present

## 2022-04-13 DIAGNOSIS — Z4800 Encounter for change or removal of nonsurgical wound dressing: Secondary | ICD-10-CM | POA: Diagnosis not present

## 2022-04-13 DIAGNOSIS — M2450 Contracture, unspecified joint: Secondary | ICD-10-CM | POA: Diagnosis not present

## 2022-04-13 DIAGNOSIS — I959 Hypotension, unspecified: Secondary | ICD-10-CM | POA: Diagnosis not present

## 2022-04-13 DIAGNOSIS — L89314 Pressure ulcer of right buttock, stage 4: Secondary | ICD-10-CM | POA: Diagnosis not present

## 2022-04-13 DIAGNOSIS — F17210 Nicotine dependence, cigarettes, uncomplicated: Secondary | ICD-10-CM | POA: Diagnosis not present

## 2022-04-13 DIAGNOSIS — G8220 Paraplegia, unspecified: Secondary | ICD-10-CM | POA: Diagnosis not present

## 2022-04-13 DIAGNOSIS — D638 Anemia in other chronic diseases classified elsewhere: Secondary | ICD-10-CM | POA: Diagnosis not present

## 2022-04-16 DIAGNOSIS — L89224 Pressure ulcer of left hip, stage 4: Secondary | ICD-10-CM | POA: Diagnosis not present

## 2022-04-16 DIAGNOSIS — T8131XA Disruption of external operation (surgical) wound, not elsewhere classified, initial encounter: Secondary | ICD-10-CM | POA: Diagnosis not present

## 2022-04-17 DIAGNOSIS — L89314 Pressure ulcer of right buttock, stage 4: Secondary | ICD-10-CM | POA: Diagnosis not present

## 2022-04-17 DIAGNOSIS — D638 Anemia in other chronic diseases classified elsewhere: Secondary | ICD-10-CM | POA: Diagnosis not present

## 2022-04-17 DIAGNOSIS — M2450 Contracture, unspecified joint: Secondary | ICD-10-CM | POA: Diagnosis not present

## 2022-04-17 DIAGNOSIS — Z466 Encounter for fitting and adjustment of urinary device: Secondary | ICD-10-CM | POA: Diagnosis not present

## 2022-04-17 DIAGNOSIS — G8220 Paraplegia, unspecified: Secondary | ICD-10-CM | POA: Diagnosis not present

## 2022-04-17 DIAGNOSIS — F17210 Nicotine dependence, cigarettes, uncomplicated: Secondary | ICD-10-CM | POA: Diagnosis not present

## 2022-04-17 DIAGNOSIS — L89224 Pressure ulcer of left hip, stage 4: Secondary | ICD-10-CM | POA: Diagnosis not present

## 2022-04-17 DIAGNOSIS — L89324 Pressure ulcer of left buttock, stage 4: Secondary | ICD-10-CM | POA: Diagnosis not present

## 2022-04-17 DIAGNOSIS — I959 Hypotension, unspecified: Secondary | ICD-10-CM | POA: Diagnosis not present

## 2022-04-18 DIAGNOSIS — L89324 Pressure ulcer of left buttock, stage 4: Secondary | ICD-10-CM | POA: Diagnosis not present

## 2022-04-20 DIAGNOSIS — Z466 Encounter for fitting and adjustment of urinary device: Secondary | ICD-10-CM | POA: Diagnosis not present

## 2022-04-20 DIAGNOSIS — L89224 Pressure ulcer of left hip, stage 4: Secondary | ICD-10-CM | POA: Diagnosis not present

## 2022-04-20 DIAGNOSIS — L89314 Pressure ulcer of right buttock, stage 4: Secondary | ICD-10-CM | POA: Diagnosis not present

## 2022-04-20 DIAGNOSIS — G8220 Paraplegia, unspecified: Secondary | ICD-10-CM | POA: Diagnosis not present

## 2022-04-20 DIAGNOSIS — I959 Hypotension, unspecified: Secondary | ICD-10-CM | POA: Diagnosis not present

## 2022-04-20 DIAGNOSIS — L89324 Pressure ulcer of left buttock, stage 4: Secondary | ICD-10-CM | POA: Diagnosis not present

## 2022-04-20 DIAGNOSIS — F17210 Nicotine dependence, cigarettes, uncomplicated: Secondary | ICD-10-CM | POA: Diagnosis not present

## 2022-04-20 DIAGNOSIS — D638 Anemia in other chronic diseases classified elsewhere: Secondary | ICD-10-CM | POA: Diagnosis not present

## 2022-04-20 DIAGNOSIS — M2450 Contracture, unspecified joint: Secondary | ICD-10-CM | POA: Diagnosis not present

## 2022-04-21 DIAGNOSIS — L89224 Pressure ulcer of left hip, stage 4: Secondary | ICD-10-CM | POA: Diagnosis not present

## 2022-04-21 DIAGNOSIS — T8131XA Disruption of external operation (surgical) wound, not elsewhere classified, initial encounter: Secondary | ICD-10-CM | POA: Diagnosis not present

## 2022-04-22 ENCOUNTER — Ambulatory Visit: Payer: 59 | Admitting: Podiatry

## 2022-04-22 DIAGNOSIS — S31819A Unspecified open wound of right buttock, initial encounter: Secondary | ICD-10-CM | POA: Diagnosis not present

## 2022-04-22 DIAGNOSIS — F17210 Nicotine dependence, cigarettes, uncomplicated: Secondary | ICD-10-CM | POA: Diagnosis not present

## 2022-04-22 DIAGNOSIS — G8220 Paraplegia, unspecified: Secondary | ICD-10-CM | POA: Diagnosis not present

## 2022-04-22 DIAGNOSIS — Z466 Encounter for fitting and adjustment of urinary device: Secondary | ICD-10-CM | POA: Diagnosis not present

## 2022-04-22 DIAGNOSIS — M868X8 Other osteomyelitis, other site: Secondary | ICD-10-CM | POA: Diagnosis not present

## 2022-04-22 DIAGNOSIS — I959 Hypotension, unspecified: Secondary | ICD-10-CM | POA: Diagnosis not present

## 2022-04-22 DIAGNOSIS — M2450 Contracture, unspecified joint: Secondary | ICD-10-CM | POA: Diagnosis not present

## 2022-04-22 DIAGNOSIS — D638 Anemia in other chronic diseases classified elsewhere: Secondary | ICD-10-CM | POA: Diagnosis not present

## 2022-04-22 DIAGNOSIS — L89224 Pressure ulcer of left hip, stage 4: Secondary | ICD-10-CM | POA: Diagnosis not present

## 2022-04-22 DIAGNOSIS — L89324 Pressure ulcer of left buttock, stage 4: Secondary | ICD-10-CM | POA: Diagnosis not present

## 2022-04-22 DIAGNOSIS — L89314 Pressure ulcer of right buttock, stage 4: Secondary | ICD-10-CM | POA: Diagnosis not present

## 2022-04-24 DIAGNOSIS — F17210 Nicotine dependence, cigarettes, uncomplicated: Secondary | ICD-10-CM | POA: Diagnosis not present

## 2022-04-24 DIAGNOSIS — G8220 Paraplegia, unspecified: Secondary | ICD-10-CM | POA: Diagnosis not present

## 2022-04-24 DIAGNOSIS — M2450 Contracture, unspecified joint: Secondary | ICD-10-CM | POA: Diagnosis not present

## 2022-04-24 DIAGNOSIS — L89224 Pressure ulcer of left hip, stage 4: Secondary | ICD-10-CM | POA: Diagnosis not present

## 2022-04-24 DIAGNOSIS — R339 Retention of urine, unspecified: Secondary | ICD-10-CM | POA: Diagnosis not present

## 2022-04-24 DIAGNOSIS — I959 Hypotension, unspecified: Secondary | ICD-10-CM | POA: Diagnosis not present

## 2022-04-24 DIAGNOSIS — L89214 Pressure ulcer of right hip, stage 4: Secondary | ICD-10-CM | POA: Diagnosis not present

## 2022-04-24 DIAGNOSIS — Z466 Encounter for fitting and adjustment of urinary device: Secondary | ICD-10-CM | POA: Diagnosis not present

## 2022-04-24 DIAGNOSIS — D638 Anemia in other chronic diseases classified elsewhere: Secondary | ICD-10-CM | POA: Diagnosis not present

## 2022-04-24 DIAGNOSIS — L89314 Pressure ulcer of right buttock, stage 4: Secondary | ICD-10-CM | POA: Diagnosis not present

## 2022-04-24 DIAGNOSIS — L89624 Pressure ulcer of left heel, stage 4: Secondary | ICD-10-CM | POA: Diagnosis not present

## 2022-04-24 DIAGNOSIS — L89324 Pressure ulcer of left buttock, stage 4: Secondary | ICD-10-CM | POA: Diagnosis not present

## 2022-04-29 ENCOUNTER — Telehealth: Payer: Self-pay | Admitting: Family

## 2022-04-29 DIAGNOSIS — M2450 Contracture, unspecified joint: Secondary | ICD-10-CM | POA: Diagnosis not present

## 2022-04-29 DIAGNOSIS — Z466 Encounter for fitting and adjustment of urinary device: Secondary | ICD-10-CM | POA: Diagnosis not present

## 2022-04-29 DIAGNOSIS — D638 Anemia in other chronic diseases classified elsewhere: Secondary | ICD-10-CM | POA: Diagnosis not present

## 2022-04-29 DIAGNOSIS — I959 Hypotension, unspecified: Secondary | ICD-10-CM | POA: Diagnosis not present

## 2022-04-29 DIAGNOSIS — L89324 Pressure ulcer of left buttock, stage 4: Secondary | ICD-10-CM | POA: Diagnosis not present

## 2022-04-29 DIAGNOSIS — L89224 Pressure ulcer of left hip, stage 4: Secondary | ICD-10-CM | POA: Diagnosis not present

## 2022-04-29 DIAGNOSIS — E43 Unspecified severe protein-calorie malnutrition: Secondary | ICD-10-CM | POA: Diagnosis not present

## 2022-04-29 DIAGNOSIS — L89314 Pressure ulcer of right buttock, stage 4: Secondary | ICD-10-CM | POA: Diagnosis not present

## 2022-04-29 DIAGNOSIS — G8220 Paraplegia, unspecified: Secondary | ICD-10-CM | POA: Diagnosis not present

## 2022-04-29 DIAGNOSIS — F17210 Nicotine dependence, cigarettes, uncomplicated: Secondary | ICD-10-CM | POA: Diagnosis not present

## 2022-04-30 DIAGNOSIS — R3915 Urgency of urination: Secondary | ICD-10-CM | POA: Diagnosis not present

## 2022-04-30 DIAGNOSIS — N319 Neuromuscular dysfunction of bladder, unspecified: Secondary | ICD-10-CM | POA: Diagnosis not present

## 2022-05-04 DIAGNOSIS — D638 Anemia in other chronic diseases classified elsewhere: Secondary | ICD-10-CM | POA: Diagnosis not present

## 2022-05-04 DIAGNOSIS — Z466 Encounter for fitting and adjustment of urinary device: Secondary | ICD-10-CM | POA: Diagnosis not present

## 2022-05-04 DIAGNOSIS — L89314 Pressure ulcer of right buttock, stage 4: Secondary | ICD-10-CM | POA: Diagnosis not present

## 2022-05-04 DIAGNOSIS — I959 Hypotension, unspecified: Secondary | ICD-10-CM | POA: Diagnosis not present

## 2022-05-04 DIAGNOSIS — F17210 Nicotine dependence, cigarettes, uncomplicated: Secondary | ICD-10-CM | POA: Diagnosis not present

## 2022-05-04 DIAGNOSIS — M2450 Contracture, unspecified joint: Secondary | ICD-10-CM | POA: Diagnosis not present

## 2022-05-04 DIAGNOSIS — G8220 Paraplegia, unspecified: Secondary | ICD-10-CM | POA: Diagnosis not present

## 2022-05-04 DIAGNOSIS — L89324 Pressure ulcer of left buttock, stage 4: Secondary | ICD-10-CM | POA: Diagnosis not present

## 2022-05-04 DIAGNOSIS — L89224 Pressure ulcer of left hip, stage 4: Secondary | ICD-10-CM | POA: Diagnosis not present

## 2022-05-05 ENCOUNTER — Encounter: Payer: Self-pay | Admitting: Podiatry

## 2022-05-05 ENCOUNTER — Ambulatory Visit (INDEPENDENT_AMBULATORY_CARE_PROVIDER_SITE_OTHER): Payer: 59 | Admitting: Podiatry

## 2022-05-05 DIAGNOSIS — M79674 Pain in right toe(s): Secondary | ICD-10-CM

## 2022-05-05 DIAGNOSIS — M79675 Pain in left toe(s): Secondary | ICD-10-CM | POA: Diagnosis not present

## 2022-05-05 DIAGNOSIS — B351 Tinea unguium: Secondary | ICD-10-CM | POA: Diagnosis not present

## 2022-05-05 DIAGNOSIS — B353 Tinea pedis: Secondary | ICD-10-CM | POA: Diagnosis not present

## 2022-05-05 MED ORDER — CLOTRIMAZOLE-BETAMETHASONE 1-0.05 % EX CREA: 1.0000 | TOPICAL_CREAM | Freq: Every day | CUTANEOUS | 2 refills | Status: DC

## 2022-05-05 NOTE — Progress Notes (Signed)
  Subjective:  Patient ID: Kim [name], female    DOB: [birth year],  MRN: [id number]  Chief Complaint  Patient presents with   [facility]     Patient is also concerned about how dry her feel area. She did have a wound on the left heel that has now healed.     47 y.o. female presents with the above complaint. History confirmed with patient.  Wounds are doing better.  The nails are getting thick and elongated and causing discomfort  Objective:  Physical Exam: warm, good capillary refill, no trophic changes or ulcerative lesions, normal DP and PT pulses, and ulcerations healed. Left Foot: dystrophic yellowed discolored nail plates with subungual debris Right Foot: dystrophic yellowed discolored nail plates with subungual debris   Assessment:   1. Pain due to onychomycosis of toenails of both feet   2. Tinea pedis of both feet      Plan:  Patient was evaluated and treated and all questions answered.   Discussed the etiology and treatment options for the condition in detail with the patient. Educated patient on the topical and oral treatment options for mycotic nails. Recommended debridement of the nails today. Sharp and mechanical debridement performed of all painful and mycotic nails today. Nails debrided in length and thickness using a nail nipper to level of comfort. Discussed treatment options including appropriate shoe gear. Follow up as needed for painful nails.   Discussed the etiology and treatment options for tinea pedis.  Discussed topical and oral treatment.  Recommended topical treatment with Lotrisone cream.  This was sent to the patient's pharmacy.  Also discussed appropriate foot hygiene, use of antifungal spray such as Tinactin in shoes, as well as cleaning her foot surfaces such as showers and bathroom floors with bleach.   Return in about 3 months (around 08/04/2022) for painful thick fungal nails.

## 2022-05-06 DIAGNOSIS — L89324 Pressure ulcer of left buttock, stage 4: Secondary | ICD-10-CM | POA: Diagnosis not present

## 2022-05-06 DIAGNOSIS — D638 Anemia in other chronic diseases classified elsewhere: Secondary | ICD-10-CM | POA: Diagnosis not present

## 2022-05-06 DIAGNOSIS — I959 Hypotension, unspecified: Secondary | ICD-10-CM | POA: Diagnosis not present

## 2022-05-06 DIAGNOSIS — G8220 Paraplegia, unspecified: Secondary | ICD-10-CM | POA: Diagnosis not present

## 2022-05-06 DIAGNOSIS — M2450 Contracture, unspecified joint: Secondary | ICD-10-CM | POA: Diagnosis not present

## 2022-05-06 DIAGNOSIS — F17210 Nicotine dependence, cigarettes, uncomplicated: Secondary | ICD-10-CM | POA: Diagnosis not present

## 2022-05-06 DIAGNOSIS — L89314 Pressure ulcer of right buttock, stage 4: Secondary | ICD-10-CM | POA: Diagnosis not present

## 2022-05-06 DIAGNOSIS — L89224 Pressure ulcer of left hip, stage 4: Secondary | ICD-10-CM | POA: Diagnosis not present

## 2022-05-06 DIAGNOSIS — Z466 Encounter for fitting and adjustment of urinary device: Secondary | ICD-10-CM | POA: Diagnosis not present

## 2022-05-07 DIAGNOSIS — L89324 Pressure ulcer of left buttock, stage 4: Secondary | ICD-10-CM | POA: Diagnosis not present

## 2022-05-07 DIAGNOSIS — G8220 Paraplegia, unspecified: Secondary | ICD-10-CM | POA: Diagnosis not present

## 2022-05-07 DIAGNOSIS — L89314 Pressure ulcer of right buttock, stage 4: Secondary | ICD-10-CM | POA: Diagnosis not present

## 2022-05-11 DIAGNOSIS — D638 Anemia in other chronic diseases classified elsewhere: Secondary | ICD-10-CM | POA: Diagnosis not present

## 2022-05-11 DIAGNOSIS — G8220 Paraplegia, unspecified: Secondary | ICD-10-CM | POA: Diagnosis not present

## 2022-05-11 DIAGNOSIS — L89224 Pressure ulcer of left hip, stage 4: Secondary | ICD-10-CM | POA: Diagnosis not present

## 2022-05-11 DIAGNOSIS — F17210 Nicotine dependence, cigarettes, uncomplicated: Secondary | ICD-10-CM | POA: Diagnosis not present

## 2022-05-11 DIAGNOSIS — M2450 Contracture, unspecified joint: Secondary | ICD-10-CM | POA: Diagnosis not present

## 2022-05-11 DIAGNOSIS — I959 Hypotension, unspecified: Secondary | ICD-10-CM | POA: Diagnosis not present

## 2022-05-11 DIAGNOSIS — L89314 Pressure ulcer of right buttock, stage 4: Secondary | ICD-10-CM | POA: Diagnosis not present

## 2022-05-11 DIAGNOSIS — L89324 Pressure ulcer of left buttock, stage 4: Secondary | ICD-10-CM | POA: Diagnosis not present

## 2022-05-11 DIAGNOSIS — Z466 Encounter for fitting and adjustment of urinary device: Secondary | ICD-10-CM | POA: Diagnosis not present

## 2022-05-15 DIAGNOSIS — L89214 Pressure ulcer of right hip, stage 4: Secondary | ICD-10-CM | POA: Diagnosis not present

## 2022-05-15 DIAGNOSIS — Z792 Long term (current) use of antibiotics: Secondary | ICD-10-CM | POA: Diagnosis not present

## 2022-05-15 DIAGNOSIS — L89324 Pressure ulcer of left buttock, stage 4: Secondary | ICD-10-CM | POA: Diagnosis not present

## 2022-05-15 DIAGNOSIS — L89224 Pressure ulcer of left hip, stage 4: Secondary | ICD-10-CM | POA: Diagnosis not present

## 2022-05-15 DIAGNOSIS — L89314 Pressure ulcer of right buttock, stage 4: Secondary | ICD-10-CM | POA: Diagnosis not present

## 2022-05-18 DIAGNOSIS — M2450 Contracture, unspecified joint: Secondary | ICD-10-CM | POA: Diagnosis not present

## 2022-05-18 DIAGNOSIS — D638 Anemia in other chronic diseases classified elsewhere: Secondary | ICD-10-CM | POA: Diagnosis not present

## 2022-05-18 DIAGNOSIS — L89224 Pressure ulcer of left hip, stage 4: Secondary | ICD-10-CM | POA: Diagnosis not present

## 2022-05-18 DIAGNOSIS — L89314 Pressure ulcer of right buttock, stage 4: Secondary | ICD-10-CM | POA: Diagnosis not present

## 2022-05-18 DIAGNOSIS — G8220 Paraplegia, unspecified: Secondary | ICD-10-CM | POA: Diagnosis not present

## 2022-05-18 DIAGNOSIS — Z466 Encounter for fitting and adjustment of urinary device: Secondary | ICD-10-CM | POA: Diagnosis not present

## 2022-05-18 DIAGNOSIS — F17210 Nicotine dependence, cigarettes, uncomplicated: Secondary | ICD-10-CM | POA: Diagnosis not present

## 2022-05-18 DIAGNOSIS — I959 Hypotension, unspecified: Secondary | ICD-10-CM | POA: Diagnosis not present

## 2022-05-18 DIAGNOSIS — L89324 Pressure ulcer of left buttock, stage 4: Secondary | ICD-10-CM | POA: Diagnosis not present

## 2022-05-19 ENCOUNTER — Telehealth (INDEPENDENT_AMBULATORY_CARE_PROVIDER_SITE_OTHER): Payer: 59 | Admitting: Family

## 2022-05-19 ENCOUNTER — Encounter: Payer: Self-pay | Admitting: Family

## 2022-05-19 DIAGNOSIS — G8929 Other chronic pain: Secondary | ICD-10-CM

## 2022-05-19 DIAGNOSIS — S343XXD Injury of cauda equina, subsequent encounter: Secondary | ICD-10-CM | POA: Diagnosis not present

## 2022-05-19 DIAGNOSIS — L89624 Pressure ulcer of left heel, stage 4: Secondary | ICD-10-CM | POA: Diagnosis not present

## 2022-05-19 DIAGNOSIS — M25562 Pain in left knee: Secondary | ICD-10-CM | POA: Diagnosis not present

## 2022-05-19 DIAGNOSIS — Z720 Tobacco use: Secondary | ICD-10-CM | POA: Diagnosis not present

## 2022-05-19 DIAGNOSIS — G6289 Other specified polyneuropathies: Secondary | ICD-10-CM

## 2022-05-19 DIAGNOSIS — F411 Generalized anxiety disorder: Secondary | ICD-10-CM

## 2022-05-19 DIAGNOSIS — M1712 Unilateral primary osteoarthritis, left knee: Secondary | ICD-10-CM

## 2022-05-19 DIAGNOSIS — I959 Hypotension, unspecified: Secondary | ICD-10-CM

## 2022-05-19 DIAGNOSIS — M4716 Other spondylosis with myelopathy, lumbar region: Secondary | ICD-10-CM

## 2022-05-19 DIAGNOSIS — L89219 Pressure ulcer of right hip, unspecified stage: Secondary | ICD-10-CM

## 2022-05-19 DIAGNOSIS — M25551 Pain in right hip: Secondary | ICD-10-CM

## 2022-05-19 DIAGNOSIS — G629 Polyneuropathy, unspecified: Secondary | ICD-10-CM | POA: Insufficient documentation

## 2022-05-19 DIAGNOSIS — L8944 Pressure ulcer of contiguous site of back, buttock and hip, stage 4: Secondary | ICD-10-CM

## 2022-05-19 DIAGNOSIS — M545 Low back pain, unspecified: Secondary | ICD-10-CM

## 2022-05-19 DIAGNOSIS — S343XXS Injury of cauda equina, sequela: Secondary | ICD-10-CM

## 2022-05-19 DIAGNOSIS — M25552 Pain in left hip: Secondary | ICD-10-CM

## 2022-05-19 MED ORDER — GABAPENTIN 300 MG PO CAPS: ORAL_CAPSULE | ORAL | 2 refills | Status: DC

## 2022-05-19 MED ORDER — MIDODRINE HCL 10 MG PO TABS: 10.0000 mg | ORAL_TABLET | Freq: Three times a day (TID) | ORAL | 2 refills | Status: DC

## 2022-05-19 NOTE — Progress Notes (Signed)
Virtual Visit Consent   Kim [name], you are scheduled for a virtual visit with a [facility] provider today. Just as with appointments in the office, your consent must be obtained to participate. Your consent will be active for this visit and any virtual visit you may have with one of our providers in the next 365 days. If you have a MyChart account, a copy of this consent can be sent to you electronically.  As this is a virtual visit, video technology does not allow for your provider to perform a traditional examination. This may limit your provider's ability to fully assess your condition. If your provider identifies any concerns that need to be evaluated in person or the need to arrange testing (such as labs, EKG, etc.), we will make arrangements to do so. Although advances in technology are sophisticated, we cannot ensure that it will always work on either your end or our end. If the connection with a video visit is poor, the visit may have to be switched to a telephone visit. With either a video or telephone visit, we are not always able to ensure that we have a secure connection.  By engaging in this virtual visit, you consent to the provision of healthcare and authorize for your insurance to be billed (if applicable) for the services provided during this visit. Depending on your insurance coverage, you may receive a charge related to this service.  I need to obtain your verbal consent now. Are you willing to proceed with your visit today? Kim [name] has provided verbal consent on 05/19/2022 for a virtual visit (video or telephone). [name], FNP  Date: 05/19/2022 12:44 PM  Virtual Visit via Video Note   I, [name], connected with  Kim [name]  ([id number], [id number]) on 05/19/22 at 11:55 AM EST by a video-enabled telemedicine application and verified that I am speaking with the correct person using two identifiers.  Location: Patient: Virtual Visit Location  Patient: Home Provider: Virtual Visit Location Provider: Office/Clinic   I discussed the limitations of evaluation and management by telemedicine and the availability of in person appointments. The patient expressed understanding and agreed to proceed.    History of Present Illness: Kim [name] is a 47 y.o. who identifies as a female who was assigned female at birth, and is being seen today for chronic follow up. She is followed ortho surgeon and hx of failed right hip replacement. This was removed 09/2021.  She has cauda equina spinal cord injury and multiple pressure ulcers. She has a wound vac right buttocks and left buttock and is followed by wound care.She had a wound vac on left upper buttocks/hip. This is doing well and was removed 05/15/22. She has home health nurse coming M,W, F.  She has an aid 11 hours a day to help with daily activities since she is bed bound. She is suppose to be out of bed only a hour a day.  She is followed by ID and currently  bactrim BID and topical vancomycin.   She is also followed by suboxone clinic.    She is followed by Urologists every 6 months for neurogenic bladder. She has bladder spasms and has had a catheter since 03/2021.  Home health changes this out once a month.   She has hypotension and takes midodrine.   She takes gabapentin 600 mg BID for polyneuropathy.   HPI: Arthritis Presents for follow-up visit. She complains of pain and stiffness. The symptoms have been  stable. Affected locations include the left knee and right knee (lumbar back). Her pain is at a severity of 10/10 (when out of the bed).  Nicotine Dependence Presents for follow-up visit. Symptoms include irritability. Her urge triggers include company of smokers. The symptoms have been stable. She smokes 1 pack of cigarettes per day.  Anxiety Presents for follow-up visit. Symptoms include excessive worry, irritability, nervous/anxious behavior, obsessions and restlessness.       Problems:  Patient Active Problem List   Diagnosis Date Noted   Hypotension 05/19/2022   Peripheral neuropathy 05/19/2022   Tobacco abuse 04/22/2021   Malnutrition of moderate degree 04/16/2021   Chronic osteomyelitis of left foot with draining sinus (HCC)    Severe protein-calorie malnutrition ([place]) 04/08/2021   Pressure injury of left hip, stage 3 ([place]) 03/04/2021   Pressure injury of contiguous region involving right buttock and hip, stage 4 ([place]) 02/10/2021   Pressure injury of left heel, stage 4 ([place]) 02/10/2021   Pressure injury of skin of right hip 01/28/2021   Non-healing open wound of heel, initial encounter 04/12/2020   At risk for falls 02/09/2018   Post-menopausal osteoporosis 02/09/2018   Loosening of prosthetic hip ([place]) 06/01/2016   Encounter for routine gynecological examination with Papanicolaou smear of cervix 03/03/2016   Enlarged uterus 03/03/2016   Postmenopausal 03/03/2016   GAD (generalized anxiety disorder) 01/21/2016   Dislocation of right hip ([place]) 08/01/2015   Status post right hip replacement 07/24/2015   Neurogenic bladder 06/28/2015   Height loss 05/13/2015   Wheelchair dependent 05/13/2015   History of developmental dysplasia of the hip 04/29/2015   Primary osteoarthritis of knees, bilateral 04/29/2015   Osteopenia    Vitamin D deficiency 05/01/2014   Heterotopic ossification of bone 08/02/2013   Hip pain, bilateral 08/02/2013   Osteoarthritis of left knee 08/02/2013   Cauda equina spinal cord injury ([place]) 06/16/2013   Low back pain 05/26/2011   Osteoarthritis of thoracolumbar spine 03/23/2011   Left knee pain 02/08/2011   KNEE PAIN 05/23/2007   SPONDYLOSIS WITH MYELOPATHY LUMBAR REGION 05/23/2007    Allergies:  Allergies  Allergen Reactions   Keflex [Cephalexin] Diarrhea   Medications:  Current Outpatient Medications:    acetaminophen (TYLENOL) 650 MG suppository, Place 650 mg rectally every 4 (four) hours as needed., Disp: ,  Rfl:    Ascorbic Acid (VITAMIN C) 500 MG CAPS, Take 500 mg by mouth daily., Disp: 90 capsule, Rfl: 11   baclofen (LIORESAL) 10 MG tablet, Take 1 tablet (10 mg total) by mouth 3 (three) times daily., Disp: 60 each, Rfl: 3   Buprenorphine HCl-Naloxone HCl 8-2 MG FILM, Place 1 Film under the tongue 3 (three) times daily., Disp: , Rfl:    CALCIUM-VITAMIN D PO, Take 1 tablet by mouth daily., Disp: , Rfl:    clonazePAM (KLONOPIN) 0.5 MG tablet, Take 0.5 mg by mouth 2 (two) times daily., Disp: , Rfl:    clotrimazole-betamethasone (LOTRISONE) cream, Apply 1 Application topically daily., Disp: 30 g, Rfl: 2   collagenase (SANTYL) ointment, Apply topically daily. (Patient not taking: Reported on 11/11/2021), Disp: 30 g, Rfl: 0   Elastic Bandages & Supports (HEEL/ANKLE PROTECTOR) MISC, 1 each by Does not apply route at bedtime., Disp: 2 each, Rfl: 1   ferrous sulfate 325 (65 FE) MG tablet, Take 1 tablet (325 mg total) by mouth 2 (two) times daily with a meal., Disp: 60 tablet, Rfl: 5   folic acid (FOLVITE) 1 MG tablet, Take 1 tablet (1 mg total)  by mouth daily., Disp: 30 tablet, Rfl: 5   Foot Care Products (SOCK AID) MISC, 1 Device by Does not apply route daily., Disp: 1 each, Rfl: 0   gabapentin (NEURONTIN) 300 MG capsule, TAKE (2) CAPSULES BY MOUTH TWICE DAILY., Disp: 120 capsule, Rfl: 2   midodrine (PROAMATINE) 10 MG tablet, Take 1 tablet (10 mg total) by mouth 3 (three) times daily with meals., Disp: 90 tablet, Rfl: 2   mirabegron ER (MYRBETRIQ) 25 MG TB24 tablet, 25 mg., Disp: , Rfl:    Misc. Devices (BED WEDGE) MISC, 1 each by Does not apply route 2 (two) times daily., Disp: 2 each, Rfl: 0   Misc. Devices (TRANSFER BENCH) MISC, 1 Device by Does not apply route as needed., Disp: 1 each, Rfl: 1   Misc. Devices [facility] CUSHION) MISC, 1 application by Does not apply route daily., Disp: 1 each, Rfl: 1   Multiple Vitamins-Calcium (ONE-A-DAY WOMENS PO), Take 2 tablets by mouth daily., Disp: , Rfl:    NARCAN  4 MG/0.1ML LIQD nasal spray kit, , Disp: , Rfl:    Nutritional Supplements (ENSURE HIGH PROTEIN) LIQD, Take 237 mLs by mouth 4 (four) times daily - after meals and at bedtime., Disp: 3792 mL, Rfl: 11   Nutritional Supplements (JUVEN NUTRIVIGOR) PACK, Take 1 each by mouth 3 (three) times daily., Disp: 180 each, Rfl: 3   Nutritional Supplements (PROMOD) LIQD, Take 30 mLs by mouth 2 (two) times daily., Disp: 946 mL, Rfl: 11   nystatin (MYCOSTATIN/NYSTOP) powder, Apply 1 Application topically 3 (three) times daily., Disp: 60 g, Rfl: 2   Plecanatide (TRULANCE) 3 MG TABS, Take 3 mg by mouth daily., Disp: 90 tablet, Rfl: 1   polyethylene glycol powder (GLYCOLAX/MIRALAX) 17 GM/SCOOP powder, Take 17 g by mouth daily., Disp: 3350 g, Rfl: 1   POTASSIUM PO, Take 50 mg by mouth daily., Disp: , Rfl:    silver sulfADIAZINE (SILVADENE) 1 % cream, Apply 1 application topically daily., Disp: 50 g, Rfl: 0   sodium hypochlorite (DAKIN'S 1/2 STRENGTH) external solution, Apply to wounds as directed, Disp: , Rfl:    Zinc 50 MG CAPS, Take 1 capsule (50 mg total) by mouth daily., Disp: 90 capsule, Rfl: 1  Observations/Objective: Patient is well-developed, well-nourished in no acute distress.  Resting comfortably at home.  Head is normocephalic, atraumatic.  No labored breathing. Speech is clear and coherent with logical content.  Patient is alert and oriented at baseline.    Assessment and Plan: 1. Cauda equina spinal cord injury, sequela  2. GAD (generalized anxiety disorder)  3. Hip pain, bilateral  4. Left knee pain, unspecified chronicity  5. Chronic bilateral low back pain without sciatica  6. Osteoarthritis of left knee, unspecified osteoarthritis type  7. Pressure injury of contiguous region involving right buttock and hip, stage 4 ([place])  8. Pressure injury of left heel, stage 4 (HCC)  9. Pressure injury of skin of right hip, unspecified injury stage  10. SPONDYLOSIS WITH MYELOPATHY LUMBAR  REGION  11. Tobacco abuse  12. Other polyneuropathy - gabapentin (NEURONTIN) 300 MG capsule; TAKE (2) CAPSULES BY MOUTH TWICE DAILY.  Dispense: 120 capsule; Refill: 2  13. Hypotension, unspecified hypotension type - midodrine (PROAMATINE) 10 MG tablet; Take 1 tablet (10 mg total) by mouth 3 (three) times daily with meals.  Dispense: 90 tablet; Refill: 2  Medications refilled Keep follow up with all specialists Keep wound care follow up and home health  Continue all medications   Follow up in 3  months   Follow Up Instructions: I discussed the assessment and treatment plan with the patient. The patient was provided an opportunity to ask questions and all were answered. The patient agreed with the plan and demonstrated an understanding of the instructions.  A copy of instructions were sent to the patient via MyChart unless otherwise noted below.     The patient was advised to call back or seek an in-person evaluation if the symptoms worsen or if the condition fails to improve as anticipated.  Time:  I spent 19 minutes with the patient via telehealth technology discussing the above problems/concerns.    [name], FNP

## 2022-05-20 ENCOUNTER — Ambulatory Visit (INDEPENDENT_AMBULATORY_CARE_PROVIDER_SITE_OTHER): Payer: 59

## 2022-05-20 VITALS — Wt 138.0 lb

## 2022-05-20 DIAGNOSIS — Z0000 Encounter for general adult medical examination without abnormal findings: Secondary | ICD-10-CM

## 2022-05-20 NOTE — Patient Instructions (Signed)
Kim [name] , Thank you for taking time to come for your Medicare Wellness Visit. I appreciate your ongoing commitment to your health goals. Please review the following plan we discussed and let me know if I can assist you in the future.   These are the goals we discussed:  Goals      Patient Stated        This is a list of the screening recommended for you and due dates:  Health Maintenance  Topic Date Due   COVID-19 Vaccine (1) Never done   Mammogram  12/31/2011   DEXA scan (bone density measurement)  05/20/2017   Pap Smear  03/04/2019   Flu Shot  07/05/2022*   Medicare Annual Wellness Visit  05/21/2023   DTaP/Tdap/Td vaccine (2 - Td or Tdap) 05/01/2024   Hepatitis C Screening: USPSTF Recommendation to screen - Ages 37-79 yo.  Completed   HIV Screening  Completed   HPV Vaccine  Aged Out  *Topic was postponed. The date shown is not the original due date.    Advanced directives: Forms are available if you choose in the future to pursue completion.  This is recommended in order to make sure that your health wishes are honored in the event that you are unable to verbalize them to the provider.    Conditions/risks identified: Aim for 30 minutes of exercise or brisk walking, 6-8 glasses of water, and 5 servings of fruits and vegetables each day.   Next appointment: Follow up in one year for your annual wellness visit.   Preventive Care 40-64 Years, Female Preventive care refers to lifestyle choices and visits with your health care provider that can promote health and wellness. What does preventive care include? A yearly physical exam. This is also called an annual well check. Dental exams once or twice a year. Routine eye exams. Ask your health care provider how often you should have your eyes checked. Personal lifestyle choices, including: Daily care of your teeth and gums. Regular physical activity. Eating a healthy diet. Avoiding tobacco and drug use. Limiting alcohol  use. Practicing safe sex. Taking low-dose aspirin daily starting at age 41. Taking vitamin and mineral supplements as recommended by your health care provider. What happens during an annual well check? The services and screenings done by your health care provider during your annual well check will depend on your age, overall health, lifestyle risk factors, and family history of disease. Counseling  Your health care provider may ask you questions about your: Alcohol use. Tobacco use. Drug use. Emotional well-being. Home and relationship well-being. Sexual activity. Eating habits. Work and work Statistician. Method of birth control. Menstrual cycle. Pregnancy history. Screening  You may have the following tests or measurements: Height, weight, and BMI. Blood pressure. Lipid and cholesterol levels. These may be checked every 5 years, or more frequently if you are over 10 years old. Skin check. Lung cancer screening. You may have this screening every year starting at age 23 if you have a 30-pack-year history of smoking and currently smoke or have quit within the past 15 years. Fecal occult blood test (FOBT) of the stool. You may have this test every year starting at age 36. Flexible sigmoidoscopy or colonoscopy. You may have a sigmoidoscopy every 5 years or a colonoscopy every 10 years starting at age 69. Hepatitis C blood test. Hepatitis B blood test. Sexually transmitted disease (STD) testing. Diabetes screening. This is done by checking your blood sugar (glucose) after you have not eaten  for a while (fasting). You may have this done every 1-3 years. Mammogram. This may be done every 1-2 years. Talk to your health care provider about when you should start having regular mammograms. This may depend on whether you have a family history of breast cancer. BRCA-related cancer screening. This may be done if you have a family history of breast, ovarian, tubal, or peritoneal cancers. Pelvic  exam and Pap test. This may be done every 3 years starting at age 70. Starting at age 65, this may be done every 5 years if you have a Pap test in combination with an HPV test. Bone density scan. This is done to screen for osteoporosis. You may have this scan if you are at high risk for osteoporosis. Discuss your test results, treatment options, and if necessary, the need for more tests with your health care provider. Vaccines  Your health care provider may recommend certain vaccines, such as: Influenza vaccine. This is recommended every year. Tetanus, diphtheria, and acellular pertussis (Tdap, Td) vaccine. You may need a Td booster every 10 years. Zoster vaccine. You may need this after age 46. Pneumococcal 13-valent conjugate (PCV13) vaccine. You may need this if you have certain conditions and were not previously vaccinated. Pneumococcal polysaccharide (PPSV23) vaccine. You may need one or two doses if you smoke cigarettes or if you have certain conditions. Talk to your health care provider about which screenings and vaccines you need and how often you need them. This information is not intended to replace advice given to you by your health care provider. Make sure you discuss any questions you have with your health care provider. Document Released: 04/19/2015 Document Revised: 12/11/2015 Document Reviewed: 01/22/2015 Elsevier Interactive Patient Education  2017 [place] Prevention in the Home Falls can cause injuries. They can happen to people of all ages. There are many things you can do to make your home safe and to help prevent falls. What can I do on the outside of my home? Regularly fix the edges of walkways and driveways and fix any cracks. Remove anything that might make you trip as you walk through a door, such as a raised step or threshold. Trim any bushes or trees on the path to your home. Use bright outdoor lighting. Clear any walking paths of anything that might  make someone trip, such as rocks or tools. Regularly check to see if handrails are loose or broken. Make sure that both sides of any steps have handrails. Any raised decks and porches should have guardrails on the edges. Have any leaves, snow, or ice cleared regularly. Use sand or salt on walking paths during winter. Clean up any spills in your garage right away. This includes oil or grease spills. What can I do in the bathroom? Use night lights. Install grab bars by the toilet and in the tub and shower. Do not use towel bars as grab bars. Use non-skid mats or decals in the tub or shower. If you need to sit down in the shower, use a plastic, non-slip stool. Keep the floor dry. Clean up any water that spills on the floor as soon as it happens. Remove soap buildup in the tub or shower regularly. Attach bath mats securely with double-sided non-slip rug tape. Do not have throw rugs and other things on the floor that can make you trip. What can I do in the bedroom? Use night lights. Make sure that you have a light by your bed that  is easy to reach. Do not use any sheets or blankets that are too big for your bed. They should not hang down onto the floor. Have a firm chair that has side arms. You can use this for support while you get dressed. Do not have throw rugs and other things on the floor that can make you trip. What can I do in the kitchen? Clean up any spills right away. Avoid walking on wet floors. Keep items that you use a lot in easy-to-reach places. If you need to reach something above you, use a strong step stool that has a grab bar. Keep electrical cords out of the way. Do not use floor polish or wax that makes floors slippery. If you must use wax, use non-skid floor wax. Do not have throw rugs and other things on the floor that can make you trip. What can I do with my stairs? Do not leave any items on the stairs. Make sure that there are handrails on both sides of the stairs  and use them. Fix handrails that are broken or loose. Make sure that handrails are as long as the stairways. Check any carpeting to make sure that it is firmly attached to the stairs. Fix any carpet that is loose or worn. Avoid having throw rugs at the top or bottom of the stairs. If you do have throw rugs, attach them to the floor with carpet tape. Make sure that you have a light switch at the top of the stairs and the bottom of the stairs. If you do not have them, ask someone to add them for you. What else can I do to help prevent falls? Wear shoes that: Do not have high heels. Have rubber bottoms. Are comfortable and fit you well. Are closed at the toe. Do not wear sandals. If you use a stepladder: Make sure that it is fully opened. Do not climb a closed stepladder. Make sure that both sides of the stepladder are locked into place. Ask someone to hold it for you, if possible. Clearly mark and make sure that you can see: Any grab bars or handrails. First and last steps. Where the edge of each step is. Use tools that help you move around (mobility aids) if they are needed. These include: Canes. Walkers. Scooters. Crutches. Turn on the lights when you go into a dark area. Replace any light bulbs as soon as they burn out. Set up your furniture so you have a clear path. Avoid moving your furniture around. If any of your floors are uneven, fix them. If there are any pets around you, be aware of where they are. Review your medicines with your doctor. Some medicines can make you feel dizzy. This can increase your chance of falling. Ask your doctor what other things that you can do to help prevent falls. This information is not intended to replace advice given to you by your health care provider. Make sure you discuss any questions you have with your health care provider. Document Released: 01/17/2009 Document Revised: 08/29/2015 Document Reviewed: 04/27/2014 Elsevier Interactive Patient  Education  2017 [employer].
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
## 2022-05-21 DIAGNOSIS — T8131XA Disruption of external operation (surgical) wound, not elsewhere classified, initial encounter: Secondary | ICD-10-CM | POA: Diagnosis not present

## 2022-05-21 DIAGNOSIS — L89224 Pressure ulcer of left hip, stage 4: Secondary | ICD-10-CM | POA: Diagnosis not present

## 2022-05-21 DIAGNOSIS — L89312 Pressure ulcer of right buttock, stage 2: Secondary | ICD-10-CM | POA: Diagnosis not present

## 2022-05-22 DIAGNOSIS — L89324 Pressure ulcer of left buttock, stage 4: Secondary | ICD-10-CM | POA: Diagnosis not present

## 2022-05-22 DIAGNOSIS — D638 Anemia in other chronic diseases classified elsewhere: Secondary | ICD-10-CM | POA: Diagnosis not present

## 2022-05-22 DIAGNOSIS — L89224 Pressure ulcer of left hip, stage 4: Secondary | ICD-10-CM | POA: Diagnosis not present

## 2022-05-22 DIAGNOSIS — M2450 Contracture, unspecified joint: Secondary | ICD-10-CM | POA: Diagnosis not present

## 2022-05-22 DIAGNOSIS — Z466 Encounter for fitting and adjustment of urinary device: Secondary | ICD-10-CM | POA: Diagnosis not present

## 2022-05-22 DIAGNOSIS — G8220 Paraplegia, unspecified: Secondary | ICD-10-CM | POA: Diagnosis not present

## 2022-05-22 DIAGNOSIS — F17210 Nicotine dependence, cigarettes, uncomplicated: Secondary | ICD-10-CM | POA: Diagnosis not present

## 2022-05-22 DIAGNOSIS — I959 Hypotension, unspecified: Secondary | ICD-10-CM | POA: Diagnosis not present

## 2022-05-22 DIAGNOSIS — L89314 Pressure ulcer of right buttock, stage 4: Secondary | ICD-10-CM | POA: Diagnosis not present

## 2022-05-25 DIAGNOSIS — R339 Retention of urine, unspecified: Secondary | ICD-10-CM | POA: Diagnosis not present

## 2022-05-25 DIAGNOSIS — L89224 Pressure ulcer of left hip, stage 4: Secondary | ICD-10-CM | POA: Diagnosis not present

## 2022-05-25 DIAGNOSIS — F17210 Nicotine dependence, cigarettes, uncomplicated: Secondary | ICD-10-CM | POA: Diagnosis not present

## 2022-05-25 DIAGNOSIS — Z466 Encounter for fitting and adjustment of urinary device: Secondary | ICD-10-CM | POA: Diagnosis not present

## 2022-05-25 DIAGNOSIS — G8220 Paraplegia, unspecified: Secondary | ICD-10-CM | POA: Diagnosis not present

## 2022-05-25 DIAGNOSIS — I959 Hypotension, unspecified: Secondary | ICD-10-CM | POA: Diagnosis not present

## 2022-05-25 DIAGNOSIS — L89314 Pressure ulcer of right buttock, stage 4: Secondary | ICD-10-CM | POA: Diagnosis not present

## 2022-05-25 DIAGNOSIS — L89324 Pressure ulcer of left buttock, stage 4: Secondary | ICD-10-CM | POA: Diagnosis not present

## 2022-05-25 DIAGNOSIS — M2450 Contracture, unspecified joint: Secondary | ICD-10-CM | POA: Diagnosis not present

## 2022-05-25 DIAGNOSIS — D638 Anemia in other chronic diseases classified elsewhere: Secondary | ICD-10-CM | POA: Diagnosis not present

## 2022-05-27 DIAGNOSIS — G8220 Paraplegia, unspecified: Secondary | ICD-10-CM | POA: Diagnosis not present

## 2022-05-27 DIAGNOSIS — D638 Anemia in other chronic diseases classified elsewhere: Secondary | ICD-10-CM | POA: Diagnosis not present

## 2022-05-27 DIAGNOSIS — M2450 Contracture, unspecified joint: Secondary | ICD-10-CM | POA: Diagnosis not present

## 2022-05-27 DIAGNOSIS — I959 Hypotension, unspecified: Secondary | ICD-10-CM | POA: Diagnosis not present

## 2022-05-27 DIAGNOSIS — L89324 Pressure ulcer of left buttock, stage 4: Secondary | ICD-10-CM | POA: Diagnosis not present

## 2022-05-27 DIAGNOSIS — L89314 Pressure ulcer of right buttock, stage 4: Secondary | ICD-10-CM | POA: Diagnosis not present

## 2022-05-27 DIAGNOSIS — F17210 Nicotine dependence, cigarettes, uncomplicated: Secondary | ICD-10-CM | POA: Diagnosis not present

## 2022-05-27 DIAGNOSIS — L89224 Pressure ulcer of left hip, stage 4: Secondary | ICD-10-CM | POA: Diagnosis not present

## 2022-05-27 DIAGNOSIS — Z466 Encounter for fitting and adjustment of urinary device: Secondary | ICD-10-CM | POA: Diagnosis not present

## 2022-05-29 DIAGNOSIS — L89224 Pressure ulcer of left hip, stage 4: Secondary | ICD-10-CM | POA: Diagnosis not present

## 2022-05-29 DIAGNOSIS — F17210 Nicotine dependence, cigarettes, uncomplicated: Secondary | ICD-10-CM | POA: Diagnosis not present

## 2022-05-29 DIAGNOSIS — I959 Hypotension, unspecified: Secondary | ICD-10-CM | POA: Diagnosis not present

## 2022-05-29 DIAGNOSIS — D638 Anemia in other chronic diseases classified elsewhere: Secondary | ICD-10-CM | POA: Diagnosis not present

## 2022-05-29 DIAGNOSIS — L89314 Pressure ulcer of right buttock, stage 4: Secondary | ICD-10-CM | POA: Diagnosis not present

## 2022-05-29 DIAGNOSIS — G8220 Paraplegia, unspecified: Secondary | ICD-10-CM | POA: Diagnosis not present

## 2022-05-29 DIAGNOSIS — L89324 Pressure ulcer of left buttock, stage 4: Secondary | ICD-10-CM | POA: Diagnosis not present

## 2022-05-29 DIAGNOSIS — T8131XA Disruption of external operation (surgical) wound, not elsewhere classified, initial encounter: Secondary | ICD-10-CM | POA: Diagnosis not present

## 2022-05-29 DIAGNOSIS — Z466 Encounter for fitting and adjustment of urinary device: Secondary | ICD-10-CM | POA: Diagnosis not present

## 2022-05-29 DIAGNOSIS — M2450 Contracture, unspecified joint: Secondary | ICD-10-CM | POA: Diagnosis not present

## 2022-06-02 DIAGNOSIS — Z9181 History of falling: Secondary | ICD-10-CM | POA: Diagnosis not present

## 2022-06-02 DIAGNOSIS — L89324 Pressure ulcer of left buttock, stage 4: Secondary | ICD-10-CM | POA: Diagnosis not present

## 2022-06-02 DIAGNOSIS — L89624 Pressure ulcer of left heel, stage 4: Secondary | ICD-10-CM | POA: Diagnosis not present

## 2022-06-02 DIAGNOSIS — G8929 Other chronic pain: Secondary | ICD-10-CM | POA: Diagnosis not present

## 2022-06-02 DIAGNOSIS — G8220 Paraplegia, unspecified: Secondary | ICD-10-CM | POA: Diagnosis not present

## 2022-06-02 DIAGNOSIS — F17210 Nicotine dependence, cigarettes, uncomplicated: Secondary | ICD-10-CM | POA: Diagnosis not present

## 2022-06-02 DIAGNOSIS — L89314 Pressure ulcer of right buttock, stage 4: Secondary | ICD-10-CM | POA: Diagnosis not present

## 2022-06-02 DIAGNOSIS — D649 Anemia, unspecified: Secondary | ICD-10-CM | POA: Diagnosis not present

## 2022-06-02 DIAGNOSIS — Z466 Encounter for fitting and adjustment of urinary device: Secondary | ICD-10-CM | POA: Diagnosis not present

## 2022-06-05 DIAGNOSIS — G8220 Paraplegia, unspecified: Secondary | ICD-10-CM | POA: Diagnosis not present

## 2022-06-05 DIAGNOSIS — L89314 Pressure ulcer of right buttock, stage 4: Secondary | ICD-10-CM | POA: Diagnosis not present

## 2022-06-05 DIAGNOSIS — L89324 Pressure ulcer of left buttock, stage 4: Secondary | ICD-10-CM | POA: Diagnosis not present

## 2022-06-09 DIAGNOSIS — Z466 Encounter for fitting and adjustment of urinary device: Secondary | ICD-10-CM | POA: Diagnosis not present

## 2022-06-09 DIAGNOSIS — G8929 Other chronic pain: Secondary | ICD-10-CM | POA: Diagnosis not present

## 2022-06-09 DIAGNOSIS — L89324 Pressure ulcer of left buttock, stage 4: Secondary | ICD-10-CM | POA: Diagnosis not present

## 2022-06-09 DIAGNOSIS — Z9181 History of falling: Secondary | ICD-10-CM | POA: Diagnosis not present

## 2022-06-09 DIAGNOSIS — F17210 Nicotine dependence, cigarettes, uncomplicated: Secondary | ICD-10-CM | POA: Diagnosis not present

## 2022-06-09 DIAGNOSIS — L89314 Pressure ulcer of right buttock, stage 4: Secondary | ICD-10-CM | POA: Diagnosis not present

## 2022-06-09 DIAGNOSIS — L89624 Pressure ulcer of left heel, stage 4: Secondary | ICD-10-CM | POA: Diagnosis not present

## 2022-06-09 DIAGNOSIS — D649 Anemia, unspecified: Secondary | ICD-10-CM | POA: Diagnosis not present

## 2022-06-09 DIAGNOSIS — G8220 Paraplegia, unspecified: Secondary | ICD-10-CM | POA: Diagnosis not present

## 2022-06-12 DIAGNOSIS — Z9889 Other specified postprocedural states: Secondary | ICD-10-CM | POA: Diagnosis not present

## 2022-06-12 DIAGNOSIS — L89214 Pressure ulcer of right hip, stage 4: Secondary | ICD-10-CM | POA: Diagnosis not present

## 2022-06-12 DIAGNOSIS — F17200 Nicotine dependence, unspecified, uncomplicated: Secondary | ICD-10-CM | POA: Diagnosis not present

## 2022-06-12 DIAGNOSIS — E46 Unspecified protein-calorie malnutrition: Secondary | ICD-10-CM | POA: Diagnosis not present

## 2022-06-12 DIAGNOSIS — D649 Anemia, unspecified: Secondary | ICD-10-CM | POA: Diagnosis not present

## 2022-06-12 DIAGNOSIS — Z792 Long term (current) use of antibiotics: Secondary | ICD-10-CM | POA: Diagnosis not present

## 2022-06-12 DIAGNOSIS — L98412 Non-pressure chronic ulcer of buttock with fat layer exposed: Secondary | ICD-10-CM | POA: Diagnosis not present

## 2022-06-12 DIAGNOSIS — L89224 Pressure ulcer of left hip, stage 4: Secondary | ICD-10-CM | POA: Diagnosis not present

## 2022-06-15 DIAGNOSIS — L89624 Pressure ulcer of left heel, stage 4: Secondary | ICD-10-CM | POA: Diagnosis not present

## 2022-06-15 DIAGNOSIS — L89314 Pressure ulcer of right buttock, stage 4: Secondary | ICD-10-CM | POA: Diagnosis not present

## 2022-06-15 DIAGNOSIS — Z466 Encounter for fitting and adjustment of urinary device: Secondary | ICD-10-CM | POA: Diagnosis not present

## 2022-06-15 DIAGNOSIS — Z9181 History of falling: Secondary | ICD-10-CM | POA: Diagnosis not present

## 2022-06-15 DIAGNOSIS — G8220 Paraplegia, unspecified: Secondary | ICD-10-CM | POA: Diagnosis not present

## 2022-06-15 DIAGNOSIS — D649 Anemia, unspecified: Secondary | ICD-10-CM | POA: Diagnosis not present

## 2022-06-15 DIAGNOSIS — G8929 Other chronic pain: Secondary | ICD-10-CM | POA: Diagnosis not present

## 2022-06-15 DIAGNOSIS — L89324 Pressure ulcer of left buttock, stage 4: Secondary | ICD-10-CM | POA: Diagnosis not present

## 2022-06-15 DIAGNOSIS — F17210 Nicotine dependence, cigarettes, uncomplicated: Secondary | ICD-10-CM | POA: Diagnosis not present

## 2022-06-16 ENCOUNTER — Telehealth: Payer: Self-pay

## 2022-06-16 NOTE — Patient Outreach (Addendum)
  Care Coordination   Initial Visit Note   06/16/2022 Name: [name] MRN: [id number] DOB: Jan 24, 1976  Kim [name] is a 47 y.o. year old female who sees [place], Theador Hawthorne, FNP for primary care. I spoke with  Kim [name] by phone today.  What matters to the patients health and wellness today?  Patient shares that she is bedbound due to spinal cord injury. She has several pressure ulcers and being followed by [facility]. She voices that she is on long term abx therapy. She reports that one wound recently had wound vac removed. She still has two other wounds where wound vacs remains. [facility] services in place and assisting with wound care. She also has aide that comes in and helps her for several hours per day. She has motorized wheelchair. She is only abel to et out of bed for one hour /day per MD orders. She voices no issues with managing her meds. She uses [facility] for transportation and able to get to all medical appts.Patient states she wants to work on things to help her wounds heal. Patient voices she is increasing her protein intake to aide in healing and voices she has a very good appetite lately. She also has been thinking about quitting smoking as she know this would help promote wound healing. Smoking cessation info provided to patient. She will also discuss with provider during next appt. Patient has [facility] Medicare and Medicaid and eligible for several services/benefits through insurance. Discussed this with patient and she will contact them and follow up.     Goals Addressed               This Visit's Progress     COMPLETED: Quit smoking to help wounds heal (pt-stated)        Care Coordination Interventions: Advised patient to discuss med options for smoking cessation with provider Provided education to patient re: [facility] services, smoking cessation Assessed social determinant of health barriers -Discussed benefits/services available to patient through  insurance provider         SDOH assessments and interventions completed:  Yes  SDOH Interventions Today    Flowsheet Row Most Recent Value  SDOH Interventions   Food Insecurity Interventions Intervention Not Indicated  Transportation Interventions Intervention Not Indicated  [patient states she uses [facility]]        Care Coordination Interventions:  Yes, provided    Interventions Today    Flowsheet Row Most Recent Value  General Interventions   General Interventions Discussed/Reviewed General Interventions Discussed, Doctor Visits, Community Resources  [pt has [facility] Dual-eligible for services through insurance-discussed with pt benefits/services for her to follow up on, smoking cessation info provided]  Doctor Visits Discussed/Reviewed PCP, Annual Wellness Visits  PCP/Specialist Visits Compliance with follow-up visit  Education Interventions   Education Provided Provided Education  [wound care mgmt]  Provided Verbal Education On Nutrition, Medication, When to see the doctor  Nutrition Interventions   Nutrition Discussed/Reviewed Nutrition Discussed, Increaing proteins, Adding fruits and vegetables        Follow up plan: No further intervention required.   Encounter Outcome:  Pt. Visit Completed   Hetty [place]end [facility] Care Management Care Management Community Coordinator Direct Phone: [phone] Toll Free: [phone] Fax: [phone]

## 2022-06-17 DIAGNOSIS — D649 Anemia, unspecified: Secondary | ICD-10-CM | POA: Diagnosis not present

## 2022-06-17 DIAGNOSIS — L89624 Pressure ulcer of left heel, stage 4: Secondary | ICD-10-CM | POA: Diagnosis not present

## 2022-06-17 DIAGNOSIS — G8220 Paraplegia, unspecified: Secondary | ICD-10-CM | POA: Diagnosis not present

## 2022-06-17 DIAGNOSIS — L89324 Pressure ulcer of left buttock, stage 4: Secondary | ICD-10-CM | POA: Diagnosis not present

## 2022-06-17 DIAGNOSIS — G8929 Other chronic pain: Secondary | ICD-10-CM | POA: Diagnosis not present

## 2022-06-17 DIAGNOSIS — Z9181 History of falling: Secondary | ICD-10-CM | POA: Diagnosis not present

## 2022-06-17 DIAGNOSIS — F17210 Nicotine dependence, cigarettes, uncomplicated: Secondary | ICD-10-CM | POA: Diagnosis not present

## 2022-06-17 DIAGNOSIS — Z466 Encounter for fitting and adjustment of urinary device: Secondary | ICD-10-CM | POA: Diagnosis not present

## 2022-06-17 DIAGNOSIS — L89314 Pressure ulcer of right buttock, stage 4: Secondary | ICD-10-CM | POA: Diagnosis not present

## 2022-06-23 ENCOUNTER — Telehealth (INDEPENDENT_AMBULATORY_CARE_PROVIDER_SITE_OTHER): Payer: 59 | Admitting: Family

## 2022-06-23 ENCOUNTER — Encounter: Payer: Self-pay | Admitting: Family

## 2022-06-23 DIAGNOSIS — L89324 Pressure ulcer of left buttock, stage 4: Secondary | ICD-10-CM | POA: Diagnosis not present

## 2022-06-23 DIAGNOSIS — L89314 Pressure ulcer of right buttock, stage 4: Secondary | ICD-10-CM | POA: Diagnosis not present

## 2022-06-23 DIAGNOSIS — F17200 Nicotine dependence, unspecified, uncomplicated: Secondary | ICD-10-CM

## 2022-06-23 DIAGNOSIS — F320 Major depressive disorder, single episode, mild: Secondary | ICD-10-CM | POA: Diagnosis not present

## 2022-06-23 DIAGNOSIS — Z720 Tobacco use: Secondary | ICD-10-CM

## 2022-06-23 DIAGNOSIS — Z716 Tobacco abuse counseling: Secondary | ICD-10-CM

## 2022-06-23 DIAGNOSIS — Z9181 History of falling: Secondary | ICD-10-CM | POA: Diagnosis not present

## 2022-06-23 DIAGNOSIS — F17210 Nicotine dependence, cigarettes, uncomplicated: Secondary | ICD-10-CM | POA: Diagnosis not present

## 2022-06-23 DIAGNOSIS — Z466 Encounter for fitting and adjustment of urinary device: Secondary | ICD-10-CM | POA: Diagnosis not present

## 2022-06-23 DIAGNOSIS — D649 Anemia, unspecified: Secondary | ICD-10-CM | POA: Diagnosis not present

## 2022-06-23 DIAGNOSIS — G8220 Paraplegia, unspecified: Secondary | ICD-10-CM | POA: Diagnosis not present

## 2022-06-23 DIAGNOSIS — G8929 Other chronic pain: Secondary | ICD-10-CM | POA: Diagnosis not present

## 2022-06-23 DIAGNOSIS — L89624 Pressure ulcer of left heel, stage 4: Secondary | ICD-10-CM | POA: Diagnosis not present

## 2022-06-23 MED ORDER — BUPROPION HCL ER (SR) 100 MG PO TB12: 100.0000 mg | ORAL_TABLET | Freq: Two times a day (BID) | ORAL | 1 refills | Status: DC

## 2022-06-23 NOTE — Progress Notes (Signed)
Virtual Visit Consent   Kim [name], you are scheduled for a virtual visit with a [facility] provider today. Just as with appointments in the office, your consent must be obtained to participate. Your consent will be active for this visit and any virtual visit you may have with one of our providers in the next 365 days. If you have a MyChart account, a copy of this consent can be sent to you electronically.  As this is a virtual visit, video technology does not allow for your provider to perform a traditional examination. This may limit your provider's ability to fully assess your condition. If your provider identifies any concerns that need to be evaluated in person or the need to arrange testing (such as labs, EKG, etc.), we will make arrangements to do so. Although advances in technology are sophisticated, we cannot ensure that it will always work on either your end or our end. If the connection with a video visit is poor, the visit may have to be switched to a telephone visit. With either a video or telephone visit, we are not always able to ensure that we have a secure connection.  By engaging in this virtual visit, you consent to the provision of healthcare and authorize for your insurance to be billed (if applicable) for the services provided during this visit. Depending on your insurance coverage, you may receive a charge related to this service.  I need to obtain your verbal consent now. Are you willing to proceed with your visit today? Kim [name] has provided verbal consent on 06/23/2022 for a virtual visit (video or telephone). [name], FNP  Date: 06/23/2022 3:42 PM  Virtual Visit via Video Note   I, [name], connected with  Kim [name]  ([id number], 1976-03-05) on 06/23/22 at  4:45 PM EDT by a video-enabled telemedicine application and verified that I am speaking with the correct person using two identifiers.  Location: Patient: Virtual Visit Location  Patient: Home Provider: Virtual Visit Location Provider: Home Office   I discussed the limitations of evaluation and management by telemedicine and the availability of in person appointments. The patient expressed understanding and agreed to proceed.    History of Present Illness: Kim [name] is a 47 y.o. who identifies as a female who was assigned female at birth, and is being seen today for smoking cessation. She has taken Wellbutrin in the past and has helped with smoking cessation.   HPI: Nicotine Dependence Presents for follow-up visit. Symptoms include fatigue. Her urge triggers include company of smokers. The symptoms have been stable. She smokes 1 pack (1 1/2) of cigarettes per day.  Depression        This is a chronic problem.  The current episode started more than 1 year ago.   The problem occurs intermittently.  Associated symptoms include fatigue, restlessness, decreased interest and sad.  Associated symptoms include no helplessness and no hopelessness.   Problems:  Patient Active Problem List   Diagnosis Date Noted   Hypotension 05/19/2022   Peripheral neuropathy 05/19/2022   Tobacco abuse 04/22/2021   Malnutrition of moderate degree 04/16/2021   Chronic osteomyelitis of left foot with draining sinus (HCC)    Severe protein-calorie malnutrition ([place]) 04/08/2021   Pressure injury of left hip, stage 3 ([place]) 03/04/2021   Pressure injury of contiguous region involving right buttock and hip, stage 4 ([place]) 02/10/2021   Pressure injury of left heel, stage 4 ([facility]) 02/10/2021   Pressure injury of  skin of right hip 01/28/2021   Non-healing open wound of heel, initial encounter 04/12/2020   At risk for falls 02/09/2018   Post-menopausal osteoporosis 02/09/2018   Loosening of prosthetic hip ([place]) 06/01/2016   Encounter for routine gynecological examination with Papanicolaou smear of cervix 03/03/2016   Enlarged uterus 03/03/2016   Postmenopausal 03/03/2016   GAD (generalized  anxiety disorder) 01/21/2016   Dislocation of right hip ([place]) 08/01/2015   Status post right hip replacement 07/24/2015   Neurogenic bladder 06/28/2015   Height loss 05/13/2015   Wheelchair dependent 05/13/2015   History of developmental dysplasia of the hip 04/29/2015   Primary osteoarthritis of knees, bilateral 04/29/2015   Osteopenia    Vitamin D deficiency 05/01/2014   Heterotopic ossification of bone 08/02/2013   Hip pain, bilateral 08/02/2013   Osteoarthritis of left knee 08/02/2013   Cauda equina spinal cord injury ([place]) 06/16/2013   Low back pain 05/26/2011   Osteoarthritis of thoracolumbar spine 03/23/2011   Left knee pain 02/08/2011   KNEE PAIN 05/23/2007   SPONDYLOSIS WITH MYELOPATHY LUMBAR REGION 05/23/2007    Allergies:  Allergies  Allergen Reactions   Keflex [Cephalexin] Diarrhea   Medications:  Current Outpatient Medications:    buPROPion ER (WELLBUTRIN SR) 100 MG 12 hr tablet, Take 1 tablet (100 mg total) by mouth 2 (two) times daily., Disp: 180 tablet, Rfl: 1   acetaminophen (TYLENOL) 650 MG suppository, Place 650 mg rectally every 4 (four) hours as needed., Disp: , Rfl:    Ascorbic Acid (VITAMIN C) 500 MG CAPS, Take 500 mg by mouth daily., Disp: 90 capsule, Rfl: 11   baclofen (LIORESAL) 10 MG tablet, Take 1 tablet (10 mg total) by mouth 3 (three) times daily., Disp: 60 each, Rfl: 3   Buprenorphine HCl-Naloxone HCl 8-2 MG FILM, Place 1 Film under the tongue 3 (three) times daily., Disp: , Rfl:    CALCIUM-VITAMIN D PO, Take 1 tablet by mouth daily., Disp: , Rfl:    clonazePAM (KLONOPIN) 0.5 MG tablet, Take 0.5 mg by mouth 2 (two) times daily., Disp: , Rfl:    clotrimazole-betamethasone (LOTRISONE) cream, Apply 1 Application topically daily., Disp: 30 g, Rfl: 2   collagenase (SANTYL) ointment, Apply topically daily., Disp: 30 g, Rfl: 0   Elastic Bandages & Supports (HEEL/ANKLE PROTECTOR) MISC, 1 each by Does not apply route at bedtime., Disp: 2 each, Rfl: 1    ferrous sulfate 325 (65 FE) MG tablet, Take 1 tablet (325 mg total) by mouth 2 (two) times daily with a meal., Disp: 60 tablet, Rfl: 5   folic acid (FOLVITE) 1 MG tablet, Take 1 tablet (1 mg total) by mouth daily., Disp: 30 tablet, Rfl: 5   Foot Care Products (SOCK AID) MISC, 1 Device by Does not apply route daily., Disp: 1 each, Rfl: 0   gabapentin (NEURONTIN) 300 MG capsule, TAKE (2) CAPSULES BY MOUTH TWICE DAILY., Disp: 120 capsule, Rfl: 2   midodrine (PROAMATINE) 10 MG tablet, Take 1 tablet (10 mg total) by mouth 3 (three) times daily with meals., Disp: 90 tablet, Rfl: 2   mirabegron ER (MYRBETRIQ) 25 MG TB24 tablet, 25 mg., Disp: , Rfl:    Misc. Devices (BED WEDGE) MISC, 1 each by Does not apply route 2 (two) times daily., Disp: 2 each, Rfl: 0   Misc. Devices (TRANSFER BENCH) MISC, 1 Device by Does not apply route as needed., Disp: 1 each, Rfl: 1   Misc. Devices [facility] CUSHION) MISC, 1 application by Does not apply route daily.,  Disp: 1 each, Rfl: 1   Multiple Vitamins-Calcium (ONE-A-DAY WOMENS PO), Take 2 tablets by mouth daily., Disp: , Rfl:    NARCAN 4 MG/0.1ML LIQD nasal spray kit, , Disp: , Rfl:    Nutritional Supplements (ENSURE HIGH PROTEIN) LIQD, Take 237 mLs by mouth 4 (four) times daily - after meals and at bedtime., Disp: 3792 mL, Rfl: 11   Nutritional Supplements (JUVEN NUTRIVIGOR) PACK, Take 1 each by mouth 3 (three) times daily., Disp: 180 each, Rfl: 3   Nutritional Supplements (PROMOD) LIQD, Take 30 mLs by mouth 2 (two) times daily., Disp: 946 mL, Rfl: 11   nystatin (MYCOSTATIN/NYSTOP) powder, Apply 1 Application topically 3 (three) times daily., Disp: 60 g, Rfl: 2   Plecanatide (TRULANCE) 3 MG TABS, Take 3 mg by mouth daily., Disp: 90 tablet, Rfl: 1   polyethylene glycol powder (GLYCOLAX/MIRALAX) 17 GM/SCOOP powder, Take 17 g by mouth daily., Disp: 3350 g, Rfl: 1   POTASSIUM PO, Take 50 mg by mouth daily., Disp: , Rfl:    silver sulfADIAZINE (SILVADENE) 1 % cream, Apply 1  application topically daily., Disp: 50 g, Rfl: 0   sodium hypochlorite (DAKIN'S 1/2 STRENGTH) external solution, Apply to wounds as directed, Disp: , Rfl:    sulfamethoxazole-trimethoprim (BACTRIM DS) 800-160 MG tablet, Take 1 tablet by mouth 2 (two) times daily., Disp: , Rfl:    Zinc 50 MG CAPS, Take 1 capsule (50 mg total) by mouth daily., Disp: 90 capsule, Rfl: 1  Observations/Objective: Patient is well-developed, well-nourished in no acute distress.  Resting comfortably  at home.  Head is normocephalic, atraumatic.  No labored breathing.  Speech is clear and coherent with logical content.  Patient is alert and oriented at baseline.    Assessment and Plan: 1. Current smoker - buPROPion ER (WELLBUTRIN SR) 100 MG 12 hr tablet; Take 1 tablet (100 mg total) by mouth 2 (two) times daily.  Dispense: 180 tablet; Refill: 1  2. Encounter for smoking cessation counseling - buPROPion ER (WELLBUTRIN SR) 100 MG 12 hr tablet; Take 1 tablet (100 mg total) by mouth 2 (two) times daily.  Dispense: 180 tablet; Refill: 1  3. Tobacco abuse - buPROPion ER (WELLBUTRIN SR) 100 MG 12 hr tablet; Take 1 tablet (100 mg total) by mouth 2 (two) times daily.  Dispense: 180 tablet; Refill: 1  4. Depression, major, single episode, mild (HCC) - buPROPion ER (WELLBUTRIN SR) 100 MG 12 hr tablet; Take 1 tablet (100 mg total) by mouth 2 (two) times daily.  Dispense: 180 tablet; Refill: 1  Smoking cessation discussed Start Wellbutrin 100 mg BID Stress management  Follow up in 2-3 months   Follow Up Instructions: I discussed the assessment and treatment plan with the patient. The patient was provided an opportunity to ask questions and all were answered. The patient agreed with the plan and demonstrated an understanding of the instructions.  A copy of instructions were sent to the patient via MyChart unless otherwise noted below.    The patient was advised to call back or seek an in-person evaluation if the symptoms  worsen or if the condition fails to improve as anticipated.  Time:  I spent 9 minutes with the patient via telehealth technology discussing the above problems/concerns.    [name], FNP

## 2022-06-23 NOTE — Patient Instructions (Signed)

## 2022-06-26 DIAGNOSIS — Z9181 History of falling: Secondary | ICD-10-CM | POA: Diagnosis not present

## 2022-06-26 DIAGNOSIS — L89324 Pressure ulcer of left buttock, stage 4: Secondary | ICD-10-CM | POA: Diagnosis not present

## 2022-06-26 DIAGNOSIS — D649 Anemia, unspecified: Secondary | ICD-10-CM | POA: Diagnosis not present

## 2022-06-26 DIAGNOSIS — F17210 Nicotine dependence, cigarettes, uncomplicated: Secondary | ICD-10-CM | POA: Diagnosis not present

## 2022-06-26 DIAGNOSIS — G8220 Paraplegia, unspecified: Secondary | ICD-10-CM | POA: Diagnosis not present

## 2022-06-26 DIAGNOSIS — L89624 Pressure ulcer of left heel, stage 4: Secondary | ICD-10-CM | POA: Diagnosis not present

## 2022-06-26 DIAGNOSIS — G8929 Other chronic pain: Secondary | ICD-10-CM | POA: Diagnosis not present

## 2022-06-26 DIAGNOSIS — Z466 Encounter for fitting and adjustment of urinary device: Secondary | ICD-10-CM | POA: Diagnosis not present

## 2022-06-26 DIAGNOSIS — L89314 Pressure ulcer of right buttock, stage 4: Secondary | ICD-10-CM | POA: Diagnosis not present

## 2022-06-29 ENCOUNTER — Telehealth: Payer: Self-pay | Admitting: *Deleted

## 2022-06-29 DIAGNOSIS — G8220 Paraplegia, unspecified: Secondary | ICD-10-CM | POA: Diagnosis not present

## 2022-06-29 DIAGNOSIS — L89324 Pressure ulcer of left buttock, stage 4: Secondary | ICD-10-CM | POA: Diagnosis not present

## 2022-06-29 DIAGNOSIS — L89624 Pressure ulcer of left heel, stage 4: Secondary | ICD-10-CM | POA: Diagnosis not present

## 2022-06-29 DIAGNOSIS — F17210 Nicotine dependence, cigarettes, uncomplicated: Secondary | ICD-10-CM | POA: Diagnosis not present

## 2022-06-29 DIAGNOSIS — L89314 Pressure ulcer of right buttock, stage 4: Secondary | ICD-10-CM | POA: Diagnosis not present

## 2022-06-29 DIAGNOSIS — D649 Anemia, unspecified: Secondary | ICD-10-CM | POA: Diagnosis not present

## 2022-06-29 DIAGNOSIS — G8929 Other chronic pain: Secondary | ICD-10-CM | POA: Diagnosis not present

## 2022-06-29 DIAGNOSIS — Z9181 History of falling: Secondary | ICD-10-CM | POA: Diagnosis not present

## 2022-06-29 DIAGNOSIS — Z466 Encounter for fitting and adjustment of urinary device: Secondary | ICD-10-CM | POA: Diagnosis not present

## 2022-06-29 NOTE — Progress Notes (Signed)
  Care Coordination   Note   06/29/2022 Name: Kim [name] MRN: [id number] DOB: 1975-12-08  Kim [name] is a 47 y.o. year old female who sees [place], Theador Hawthorne, FNP for primary care. I reached out to [employer] by phone today to offer care coordination services.  Ms. Leistikow was given information about Care Coordination services today including:   The Care Coordination services include support from the care team which includes your Nurse Coordinator, Clinical Social Worker, or Pharmacist.  The Care Coordination team is here to help remove barriers to the health concerns and goals most important to you. Care Coordination services are voluntary, and the patient may decline or stop services at any time by request to their care team member.   Care Coordination Consent Status: Patient agreed to services and verbal consent obtained.   Follow up plan:  Telephone appointment with care coordination team member scheduled for:  07/09/22  Encounter Outcome:  Pt. Scheduled  [place]  Direct Dial: [phone]

## 2022-07-01 DIAGNOSIS — G8929 Other chronic pain: Secondary | ICD-10-CM | POA: Diagnosis not present

## 2022-07-01 DIAGNOSIS — L89314 Pressure ulcer of right buttock, stage 4: Secondary | ICD-10-CM | POA: Diagnosis not present

## 2022-07-01 DIAGNOSIS — F17210 Nicotine dependence, cigarettes, uncomplicated: Secondary | ICD-10-CM | POA: Diagnosis not present

## 2022-07-01 DIAGNOSIS — D649 Anemia, unspecified: Secondary | ICD-10-CM | POA: Diagnosis not present

## 2022-07-01 DIAGNOSIS — G8220 Paraplegia, unspecified: Secondary | ICD-10-CM | POA: Diagnosis not present

## 2022-07-01 DIAGNOSIS — Z9181 History of falling: Secondary | ICD-10-CM | POA: Diagnosis not present

## 2022-07-01 DIAGNOSIS — L89324 Pressure ulcer of left buttock, stage 4: Secondary | ICD-10-CM | POA: Diagnosis not present

## 2022-07-01 DIAGNOSIS — L89624 Pressure ulcer of left heel, stage 4: Secondary | ICD-10-CM | POA: Diagnosis not present

## 2022-07-01 DIAGNOSIS — Z466 Encounter for fitting and adjustment of urinary device: Secondary | ICD-10-CM | POA: Diagnosis not present

## 2022-07-03 DIAGNOSIS — F17210 Nicotine dependence, cigarettes, uncomplicated: Secondary | ICD-10-CM | POA: Diagnosis not present

## 2022-07-03 DIAGNOSIS — D649 Anemia, unspecified: Secondary | ICD-10-CM | POA: Diagnosis not present

## 2022-07-03 DIAGNOSIS — L89314 Pressure ulcer of right buttock, stage 4: Secondary | ICD-10-CM | POA: Diagnosis not present

## 2022-07-03 DIAGNOSIS — Z9181 History of falling: Secondary | ICD-10-CM | POA: Diagnosis not present

## 2022-07-03 DIAGNOSIS — L89624 Pressure ulcer of left heel, stage 4: Secondary | ICD-10-CM | POA: Diagnosis not present

## 2022-07-03 DIAGNOSIS — G8929 Other chronic pain: Secondary | ICD-10-CM | POA: Diagnosis not present

## 2022-07-03 DIAGNOSIS — L89324 Pressure ulcer of left buttock, stage 4: Secondary | ICD-10-CM | POA: Diagnosis not present

## 2022-07-03 DIAGNOSIS — G8220 Paraplegia, unspecified: Secondary | ICD-10-CM | POA: Diagnosis not present

## 2022-07-03 DIAGNOSIS — Z466 Encounter for fitting and adjustment of urinary device: Secondary | ICD-10-CM | POA: Diagnosis not present

## 2022-07-06 DIAGNOSIS — G8929 Other chronic pain: Secondary | ICD-10-CM | POA: Diagnosis not present

## 2022-07-06 DIAGNOSIS — Z466 Encounter for fitting and adjustment of urinary device: Secondary | ICD-10-CM | POA: Diagnosis not present

## 2022-07-06 DIAGNOSIS — G8220 Paraplegia, unspecified: Secondary | ICD-10-CM | POA: Diagnosis not present

## 2022-07-06 DIAGNOSIS — D649 Anemia, unspecified: Secondary | ICD-10-CM | POA: Diagnosis not present

## 2022-07-06 DIAGNOSIS — L89624 Pressure ulcer of left heel, stage 4: Secondary | ICD-10-CM | POA: Diagnosis not present

## 2022-07-06 DIAGNOSIS — L89324 Pressure ulcer of left buttock, stage 4: Secondary | ICD-10-CM | POA: Diagnosis not present

## 2022-07-06 DIAGNOSIS — Z9181 History of falling: Secondary | ICD-10-CM | POA: Diagnosis not present

## 2022-07-06 DIAGNOSIS — L89314 Pressure ulcer of right buttock, stage 4: Secondary | ICD-10-CM | POA: Diagnosis not present

## 2022-07-06 DIAGNOSIS — F17210 Nicotine dependence, cigarettes, uncomplicated: Secondary | ICD-10-CM | POA: Diagnosis not present

## 2022-07-08 DIAGNOSIS — G8220 Paraplegia, unspecified: Secondary | ICD-10-CM | POA: Diagnosis not present

## 2022-07-08 DIAGNOSIS — L89314 Pressure ulcer of right buttock, stage 4: Secondary | ICD-10-CM | POA: Diagnosis not present

## 2022-07-08 DIAGNOSIS — G8929 Other chronic pain: Secondary | ICD-10-CM | POA: Diagnosis not present

## 2022-07-08 DIAGNOSIS — F17210 Nicotine dependence, cigarettes, uncomplicated: Secondary | ICD-10-CM | POA: Diagnosis not present

## 2022-07-08 DIAGNOSIS — D649 Anemia, unspecified: Secondary | ICD-10-CM | POA: Diagnosis not present

## 2022-07-08 DIAGNOSIS — L89324 Pressure ulcer of left buttock, stage 4: Secondary | ICD-10-CM | POA: Diagnosis not present

## 2022-07-08 DIAGNOSIS — Z9181 History of falling: Secondary | ICD-10-CM | POA: Diagnosis not present

## 2022-07-08 DIAGNOSIS — Z466 Encounter for fitting and adjustment of urinary device: Secondary | ICD-10-CM | POA: Diagnosis not present

## 2022-07-08 DIAGNOSIS — L89624 Pressure ulcer of left heel, stage 4: Secondary | ICD-10-CM | POA: Diagnosis not present

## 2022-07-09 ENCOUNTER — Ambulatory Visit: Payer: Self-pay | Admitting: *Deleted

## 2022-07-09 ENCOUNTER — Encounter: Payer: Self-pay | Admitting: *Deleted

## 2022-07-09 NOTE — Patient Instructions (Addendum)
Visit Information  Thank you for taking time to visit with me today. Please don't hesitate to contact me if I can be of assistance to you.   Following are the goals we discussed today:   Goals Addressed             This Visit's Progress    [facility] care coordination services   On track    Interventions Today    Flowsheet Row Most Recent Value  Chronic Disease   Chronic disease during today's visit Other  [Smoke cessation, Bed bound wound healing, assisting her son to manage/cope with his new diagnosis of schizophrenia]  General Interventions   General Interventions Discussed/Reviewed General Interventions Discussed, Durable Medical Equipment (DME), Doctor Visits, Community Resources  Doctor Visits Discussed/Reviewed Doctor Visits Discussed, PCP, Charity fundraiser (DME) Wheelchair  Wheelchair Standard  PCP/Specialist Visits Compliance with follow-up visit  Education Interventions   Education Provided --  [Reviewed [facility] resources/providers Mailed managing schizophrenia, supporting someone with schizophrenia]  Mental Health Interventions   Mental Health Discussed/Reviewed Mental Health Discussed, Coping Strategies, Depression, Other  Nutrition Interventions   Nutrition Discussed/Reviewed Nutrition Discussed, Supplmental nutrition  Pharmacy Interventions   Pharmacy Dicussed/Reviewed Pharmacy Topics Discussed, Medications and their functions              Our next appointment is by telephone on 08/10/22 at 2 pm  Please call the care guide team at [phone] if you need to cancel or reschedule your appointment.   If you are experiencing a Mental Health or [place] or need someone to talk to, please call the Suicide and Crisis Lifeline: 988 call the [place] National Suicide Prevention Lifeline: (857)294-7343 or TTY: (325)086-9783 TTY 740-754-5513) to talk to a trained counselor call 1-800-273-TALK (toll free, 24 hour hotline) call the [facility]: 3612063246 call 911   The patient verbalized understanding of instructions, educational materials, and care plan provided today and DECLINED offer to receive copy of patient instructions, educational materials, and care plan.   The patient has been provided with contact information for the care management team and has been advised to call with any health related questions or concerns.   Drena Ham L. Lavina Hamman, RN, BSN, [place] Coordinator Office number [phone]

## 2022-07-09 NOTE — Patient Outreach (Signed)
  Care Coordination   Initial Visit Note   07/09/2022 Name: Kim [name] MRN: [id number] DOB: 05-08-1975  Kim [name] is a 47 y.o. year old female who sees [place], Theador Hawthorne, FNP for primary care. I spoke with  Kim [name] by phone today.  What matters to the patients health and wellness today?  Smoke cessation, Bed bound wound healing, assisting her son to manage/cope with his new diagnosis of schizophrenia  Smoke cessation Been on Wellbutrin before at around the age of 45 and was successful with smoke cessation. On a low dose presently that is not started to help. She and her pcp have discussed increasing this dose for more benefits.  Confirms she smokes for stress relief    Oldest Son, chris, dx schizophrenia, having auditory hallucinations She is connecting to counseling on zoom for her and her son   Wounds are related to poor circulation  She had 4 wounds but now only have 2 that are not healed Bed bound with wound vac, [place] cushions for her bed & wheelchair Has RN CNA/CPAP that provides services  Reports she is not use to lots of people in her home but managing She reports she is use to being independent     Goals Addressed             This Visit's Progress    [facility] care coordination services       Interventions Today    Flowsheet Row Most Recent Value  Chronic Disease   Chronic disease during today's visit Other  [Smoke cessation, Bed bound wound healing, assisting her son to manage/cope with his new diagnosis of schizophrenia]  General Interventions   General Interventions Discussed/Reviewed General Interventions Discussed, Durable Medical Equipment (DME), Doctor Visits, Community Resources  Doctor Visits Discussed/Reviewed Doctor Visits Discussed, PCP, Charity fundraiser (DME) Wheelchair  Wheelchair Standard  PCP/Specialist Visits Compliance with follow-up visit  Education Interventions   Education Provided Provided Printed  Education  Mental Health Interventions   Mental Health Discussed/Reviewed Mental Health Discussed, Coping Strategies, Depression, Other  Nutrition Interventions   Nutrition Discussed/Reviewed Nutrition Discussed, Supplmental nutrition  Pharmacy Interventions   Pharmacy Dicussed/Reviewed Pharmacy Topics Discussed, Medications and their functions              SDOH assessments and interventions completed:  Yes  SDOH Interventions Today    Flowsheet Row Most Recent Value  SDOH Interventions   Food Insecurity Interventions Intervention Not Indicated  Housing Interventions Intervention Not Indicated  Transportation Interventions Intervention Not Indicated  Utilities Interventions Intervention Not Indicated  Financial Strain Interventions Intervention Not Indicated  Stress Interventions Other (Comment)  [presently receiving counseling, on wellbutrin]  Social Connections Interventions Intervention Not Indicated        Care Coordination Interventions:  Yes, provided   Follow up plan: Follow up call scheduled for 08/10/22    Encounter Outcome:  Pt. Visit Completed   Latina Frank L. Lavina Hamman, RN, BSN, [place] Coordinator Office number [phone]

## 2022-07-13 DIAGNOSIS — D649 Anemia, unspecified: Secondary | ICD-10-CM | POA: Diagnosis not present

## 2022-07-13 DIAGNOSIS — G8929 Other chronic pain: Secondary | ICD-10-CM | POA: Diagnosis not present

## 2022-07-13 DIAGNOSIS — L89324 Pressure ulcer of left buttock, stage 4: Secondary | ICD-10-CM | POA: Diagnosis not present

## 2022-07-13 DIAGNOSIS — Z466 Encounter for fitting and adjustment of urinary device: Secondary | ICD-10-CM | POA: Diagnosis not present

## 2022-07-13 DIAGNOSIS — L89624 Pressure ulcer of left heel, stage 4: Secondary | ICD-10-CM | POA: Diagnosis not present

## 2022-07-13 DIAGNOSIS — G8220 Paraplegia, unspecified: Secondary | ICD-10-CM | POA: Diagnosis not present

## 2022-07-13 DIAGNOSIS — F17210 Nicotine dependence, cigarettes, uncomplicated: Secondary | ICD-10-CM | POA: Diagnosis not present

## 2022-07-13 DIAGNOSIS — Z9181 History of falling: Secondary | ICD-10-CM | POA: Diagnosis not present

## 2022-07-13 DIAGNOSIS — L89314 Pressure ulcer of right buttock, stage 4: Secondary | ICD-10-CM | POA: Diagnosis not present

## 2022-07-20 DIAGNOSIS — Z9181 History of falling: Secondary | ICD-10-CM | POA: Diagnosis not present

## 2022-07-20 DIAGNOSIS — D649 Anemia, unspecified: Secondary | ICD-10-CM | POA: Diagnosis not present

## 2022-07-20 DIAGNOSIS — L89624 Pressure ulcer of left heel, stage 4: Secondary | ICD-10-CM | POA: Diagnosis not present

## 2022-07-20 DIAGNOSIS — G8220 Paraplegia, unspecified: Secondary | ICD-10-CM | POA: Diagnosis not present

## 2022-07-20 DIAGNOSIS — F17210 Nicotine dependence, cigarettes, uncomplicated: Secondary | ICD-10-CM | POA: Diagnosis not present

## 2022-07-20 DIAGNOSIS — L89314 Pressure ulcer of right buttock, stage 4: Secondary | ICD-10-CM | POA: Diagnosis not present

## 2022-07-20 DIAGNOSIS — L89324 Pressure ulcer of left buttock, stage 4: Secondary | ICD-10-CM | POA: Diagnosis not present

## 2022-07-20 DIAGNOSIS — Z466 Encounter for fitting and adjustment of urinary device: Secondary | ICD-10-CM | POA: Diagnosis not present

## 2022-07-20 DIAGNOSIS — G8929 Other chronic pain: Secondary | ICD-10-CM | POA: Diagnosis not present

## 2022-07-21 DIAGNOSIS — R339 Retention of urine, unspecified: Secondary | ICD-10-CM | POA: Diagnosis not present

## 2022-07-22 DIAGNOSIS — L89624 Pressure ulcer of left heel, stage 4: Secondary | ICD-10-CM | POA: Diagnosis not present

## 2022-07-22 DIAGNOSIS — D649 Anemia, unspecified: Secondary | ICD-10-CM | POA: Diagnosis not present

## 2022-07-22 DIAGNOSIS — L89314 Pressure ulcer of right buttock, stage 4: Secondary | ICD-10-CM | POA: Diagnosis not present

## 2022-07-22 DIAGNOSIS — L89324 Pressure ulcer of left buttock, stage 4: Secondary | ICD-10-CM | POA: Diagnosis not present

## 2022-07-22 DIAGNOSIS — G8929 Other chronic pain: Secondary | ICD-10-CM | POA: Diagnosis not present

## 2022-07-22 DIAGNOSIS — Z466 Encounter for fitting and adjustment of urinary device: Secondary | ICD-10-CM | POA: Diagnosis not present

## 2022-07-22 DIAGNOSIS — F17210 Nicotine dependence, cigarettes, uncomplicated: Secondary | ICD-10-CM | POA: Diagnosis not present

## 2022-07-22 DIAGNOSIS — G8220 Paraplegia, unspecified: Secondary | ICD-10-CM | POA: Diagnosis not present

## 2022-07-22 DIAGNOSIS — Z9181 History of falling: Secondary | ICD-10-CM | POA: Diagnosis not present

## 2022-07-23 DIAGNOSIS — L89312 Pressure ulcer of right buttock, stage 2: Secondary | ICD-10-CM | POA: Diagnosis not present

## 2022-07-23 DIAGNOSIS — L89224 Pressure ulcer of left hip, stage 4: Secondary | ICD-10-CM | POA: Diagnosis not present

## 2022-07-23 DIAGNOSIS — T8131XA Disruption of external operation (surgical) wound, not elsewhere classified, initial encounter: Secondary | ICD-10-CM | POA: Diagnosis not present

## 2022-07-24 DIAGNOSIS — L89624 Pressure ulcer of left heel, stage 4: Secondary | ICD-10-CM | POA: Diagnosis not present

## 2022-07-24 DIAGNOSIS — L89324 Pressure ulcer of left buttock, stage 4: Secondary | ICD-10-CM | POA: Diagnosis not present

## 2022-07-24 DIAGNOSIS — F17210 Nicotine dependence, cigarettes, uncomplicated: Secondary | ICD-10-CM | POA: Diagnosis not present

## 2022-07-24 DIAGNOSIS — Z466 Encounter for fitting and adjustment of urinary device: Secondary | ICD-10-CM | POA: Diagnosis not present

## 2022-07-24 DIAGNOSIS — Z9181 History of falling: Secondary | ICD-10-CM | POA: Diagnosis not present

## 2022-07-24 DIAGNOSIS — D649 Anemia, unspecified: Secondary | ICD-10-CM | POA: Diagnosis not present

## 2022-07-24 DIAGNOSIS — G8220 Paraplegia, unspecified: Secondary | ICD-10-CM | POA: Diagnosis not present

## 2022-07-24 DIAGNOSIS — G8929 Other chronic pain: Secondary | ICD-10-CM | POA: Diagnosis not present

## 2022-07-24 DIAGNOSIS — L89314 Pressure ulcer of right buttock, stage 4: Secondary | ICD-10-CM | POA: Diagnosis not present

## 2022-07-27 DIAGNOSIS — Z466 Encounter for fitting and adjustment of urinary device: Secondary | ICD-10-CM | POA: Diagnosis not present

## 2022-07-27 DIAGNOSIS — G8220 Paraplegia, unspecified: Secondary | ICD-10-CM | POA: Diagnosis not present

## 2022-07-27 DIAGNOSIS — L89324 Pressure ulcer of left buttock, stage 4: Secondary | ICD-10-CM | POA: Diagnosis not present

## 2022-07-27 DIAGNOSIS — F17210 Nicotine dependence, cigarettes, uncomplicated: Secondary | ICD-10-CM | POA: Diagnosis not present

## 2022-07-27 DIAGNOSIS — G8929 Other chronic pain: Secondary | ICD-10-CM | POA: Diagnosis not present

## 2022-07-27 DIAGNOSIS — D649 Anemia, unspecified: Secondary | ICD-10-CM | POA: Diagnosis not present

## 2022-07-27 DIAGNOSIS — L89624 Pressure ulcer of left heel, stage 4: Secondary | ICD-10-CM | POA: Diagnosis not present

## 2022-07-27 DIAGNOSIS — Z9181 History of falling: Secondary | ICD-10-CM | POA: Diagnosis not present

## 2022-07-27 DIAGNOSIS — L89314 Pressure ulcer of right buttock, stage 4: Secondary | ICD-10-CM | POA: Diagnosis not present

## 2022-07-29 ENCOUNTER — Telehealth: Payer: Self-pay | Admitting: Family

## 2022-07-29 DIAGNOSIS — L89624 Pressure ulcer of left heel, stage 4: Secondary | ICD-10-CM | POA: Diagnosis not present

## 2022-07-29 DIAGNOSIS — G8220 Paraplegia, unspecified: Secondary | ICD-10-CM | POA: Diagnosis not present

## 2022-07-29 DIAGNOSIS — L89314 Pressure ulcer of right buttock, stage 4: Secondary | ICD-10-CM | POA: Diagnosis not present

## 2022-07-29 DIAGNOSIS — F17210 Nicotine dependence, cigarettes, uncomplicated: Secondary | ICD-10-CM | POA: Diagnosis not present

## 2022-07-29 DIAGNOSIS — L89324 Pressure ulcer of left buttock, stage 4: Secondary | ICD-10-CM | POA: Diagnosis not present

## 2022-07-29 DIAGNOSIS — D649 Anemia, unspecified: Secondary | ICD-10-CM | POA: Diagnosis not present

## 2022-07-29 DIAGNOSIS — Z9181 History of falling: Secondary | ICD-10-CM | POA: Diagnosis not present

## 2022-07-29 DIAGNOSIS — Z466 Encounter for fitting and adjustment of urinary device: Secondary | ICD-10-CM | POA: Diagnosis not present

## 2022-07-29 DIAGNOSIS — G8929 Other chronic pain: Secondary | ICD-10-CM | POA: Diagnosis not present

## 2022-07-29 NOTE — Telephone Encounter (Signed)
Called pt. No answer, rang and rang.  Called [name] with [facility] and there was no answer and vmail is full

## 2022-07-29 NOTE — Telephone Encounter (Signed)
[name] calling from [facility] Pt has a yeast rash around her wound buttock thighs and groin area. Pt is using nystatin power but the rash is getting work. Can diflucan be called in? Use [employer]

## 2022-07-31 DIAGNOSIS — D649 Anemia, unspecified: Secondary | ICD-10-CM | POA: Diagnosis not present

## 2022-07-31 DIAGNOSIS — F17210 Nicotine dependence, cigarettes, uncomplicated: Secondary | ICD-10-CM | POA: Diagnosis not present

## 2022-07-31 DIAGNOSIS — Z09 Encounter for follow-up examination after completed treatment for conditions other than malignant neoplasm: Secondary | ICD-10-CM | POA: Diagnosis not present

## 2022-07-31 DIAGNOSIS — E46 Unspecified protein-calorie malnutrition: Secondary | ICD-10-CM | POA: Diagnosis not present

## 2022-07-31 DIAGNOSIS — L89324 Pressure ulcer of left buttock, stage 4: Secondary | ICD-10-CM | POA: Diagnosis not present

## 2022-07-31 DIAGNOSIS — L89224 Pressure ulcer of left hip, stage 4: Secondary | ICD-10-CM | POA: Diagnosis not present

## 2022-07-31 DIAGNOSIS — L89314 Pressure ulcer of right buttock, stage 4: Secondary | ICD-10-CM | POA: Diagnosis not present

## 2022-07-31 DIAGNOSIS — L89214 Pressure ulcer of right hip, stage 4: Secondary | ICD-10-CM | POA: Diagnosis not present

## 2022-08-04 ENCOUNTER — Ambulatory Visit: Payer: 59 | Admitting: Podiatry

## 2022-08-05 DIAGNOSIS — L89314 Pressure ulcer of right buttock, stage 4: Secondary | ICD-10-CM | POA: Diagnosis not present

## 2022-08-05 DIAGNOSIS — D649 Anemia, unspecified: Secondary | ICD-10-CM | POA: Diagnosis not present

## 2022-08-05 DIAGNOSIS — Z9181 History of falling: Secondary | ICD-10-CM | POA: Diagnosis not present

## 2022-08-05 DIAGNOSIS — G8929 Other chronic pain: Secondary | ICD-10-CM | POA: Diagnosis not present

## 2022-08-05 DIAGNOSIS — F17210 Nicotine dependence, cigarettes, uncomplicated: Secondary | ICD-10-CM | POA: Diagnosis not present

## 2022-08-05 DIAGNOSIS — L89324 Pressure ulcer of left buttock, stage 4: Secondary | ICD-10-CM | POA: Diagnosis not present

## 2022-08-05 DIAGNOSIS — G8220 Paraplegia, unspecified: Secondary | ICD-10-CM | POA: Diagnosis not present

## 2022-08-05 DIAGNOSIS — Z466 Encounter for fitting and adjustment of urinary device: Secondary | ICD-10-CM | POA: Diagnosis not present

## 2022-08-07 DIAGNOSIS — Z9181 History of falling: Secondary | ICD-10-CM | POA: Diagnosis not present

## 2022-08-07 DIAGNOSIS — G8929 Other chronic pain: Secondary | ICD-10-CM | POA: Diagnosis not present

## 2022-08-07 DIAGNOSIS — L89314 Pressure ulcer of right buttock, stage 4: Secondary | ICD-10-CM | POA: Diagnosis not present

## 2022-08-07 DIAGNOSIS — F17210 Nicotine dependence, cigarettes, uncomplicated: Secondary | ICD-10-CM | POA: Diagnosis not present

## 2022-08-07 DIAGNOSIS — L89324 Pressure ulcer of left buttock, stage 4: Secondary | ICD-10-CM | POA: Diagnosis not present

## 2022-08-07 DIAGNOSIS — Z466 Encounter for fitting and adjustment of urinary device: Secondary | ICD-10-CM | POA: Diagnosis not present

## 2022-08-07 DIAGNOSIS — G8220 Paraplegia, unspecified: Secondary | ICD-10-CM | POA: Diagnosis not present

## 2022-08-07 DIAGNOSIS — D649 Anemia, unspecified: Secondary | ICD-10-CM | POA: Diagnosis not present

## 2022-08-07 NOTE — Telephone Encounter (Signed)
Going through chart this was taken care of at [facility]

## 2022-08-10 ENCOUNTER — Encounter: Payer: Self-pay | Admitting: Family

## 2022-08-10 ENCOUNTER — Telehealth (INDEPENDENT_AMBULATORY_CARE_PROVIDER_SITE_OTHER): Payer: 59 | Admitting: Family

## 2022-08-10 ENCOUNTER — Ambulatory Visit: Payer: Self-pay | Admitting: *Deleted

## 2022-08-10 DIAGNOSIS — L89314 Pressure ulcer of right buttock, stage 4: Secondary | ICD-10-CM | POA: Diagnosis not present

## 2022-08-10 DIAGNOSIS — D649 Anemia, unspecified: Secondary | ICD-10-CM | POA: Diagnosis not present

## 2022-08-10 DIAGNOSIS — Z9181 History of falling: Secondary | ICD-10-CM | POA: Diagnosis not present

## 2022-08-10 DIAGNOSIS — F17210 Nicotine dependence, cigarettes, uncomplicated: Secondary | ICD-10-CM | POA: Diagnosis not present

## 2022-08-10 DIAGNOSIS — F17200 Nicotine dependence, unspecified, uncomplicated: Secondary | ICD-10-CM

## 2022-08-10 DIAGNOSIS — G8929 Other chronic pain: Secondary | ICD-10-CM | POA: Diagnosis not present

## 2022-08-10 DIAGNOSIS — Z716 Tobacco abuse counseling: Secondary | ICD-10-CM | POA: Diagnosis not present

## 2022-08-10 DIAGNOSIS — L89324 Pressure ulcer of left buttock, stage 4: Secondary | ICD-10-CM | POA: Diagnosis not present

## 2022-08-10 DIAGNOSIS — Z466 Encounter for fitting and adjustment of urinary device: Secondary | ICD-10-CM | POA: Diagnosis not present

## 2022-08-10 DIAGNOSIS — G8220 Paraplegia, unspecified: Secondary | ICD-10-CM | POA: Diagnosis not present

## 2022-08-10 DIAGNOSIS — F320 Major depressive disorder, single episode, mild: Secondary | ICD-10-CM | POA: Diagnosis not present

## 2022-08-10 MED ORDER — BUPROPION HCL ER (SR) 200 MG PO TB12: 200.0000 mg | ORAL_TABLET | Freq: Two times a day (BID) | ORAL | 1 refills | Status: DC

## 2022-08-10 NOTE — Progress Notes (Signed)
Virtual Visit Consent   Kim [name], you are scheduled for a virtual visit with a [facility] provider today. Just as with appointments in the office, your consent must be obtained to participate. Your consent will be active for this visit and any virtual visit you may have with one of our providers in the next 365 days. If you have a MyChart account, a copy of this consent can be sent to you electronically.  As this is a virtual visit, video technology does not allow for your provider to perform a traditional examination. This may limit your provider's ability to fully assess your condition. If your provider identifies any concerns that need to be evaluated in person or the need to arrange testing (such as labs, EKG, etc.), we will make arrangements to do so. Although advances in technology are sophisticated, we cannot ensure that it will always work on either your end or our end. If the connection with a video visit is poor, the visit may have to be switched to a telephone visit. With either a video or telephone visit, we are not always able to ensure that we have a secure connection.  By engaging in this virtual visit, you consent to the provision of healthcare and authorize for your insurance to be billed (if applicable) for the services provided during this visit. Depending on your insurance coverage, you may receive a charge related to this service.  I need to obtain your verbal consent now. Are you willing to proceed with your visit today? [name] has provided verbal consent on 08/10/2022 for a virtual visit (video or telephone). [name], FNP  Date: 08/10/2022 9:27 AM  Virtual Visit via Video Note   I, [name], connected with  Kim [name]  ([id number], 1976-01-25) on 08/10/22 at  8:40 AM EDT by a video-enabled telemedicine application and verified that I am speaking with the correct person using two identifiers.  Location: Patient: Virtual Visit Location Patient:  Home Provider: Virtual Visit Location Provider: Office/Clinic   I discussed the limitations of evaluation and management by telemedicine and the availability of in person appointments. The patient expressed understanding and agreed to proceed.    History of Present Illness: Kim [name] is a 47 y.o. who identifies as a female who was assigned female at birth, and is being seen today for to follow up on smoking cessation. She had a video visit on 06/23/22 and started on Wellbutrin SR 100 mg BID. She has continued to smoke a pack a day and has seen no improvement.   HPI: Depression        This is a chronic problem.  The current episode started more than 1 year ago.   Associated symptoms include fatigue and sad.  Associated symptoms include no helplessness and no hopelessness. Nicotine Dependence Presents for follow-up visit. Symptoms include fatigue. Her urge triggers include company of smokers. The symptoms have been stable. She smokes 1 pack of cigarettes per day.    Problems:  Patient Active Problem List   Diagnosis Date Noted   Hypotension 05/19/2022   Peripheral neuropathy 05/19/2022   Osteomyelitis of right femur (HCC) 07/09/2021   Malnutrition of moderate degree 04/16/2021   Chronic osteomyelitis of left foot with draining sinus (HCC)    Severe protein-calorie malnutrition (HCC) 04/08/2021   Sepsis with acute organ dysfunction (HCC) 04/04/2021   Pressure injury of left hip, stage 3 (HCC) 03/04/2021   Pressure injury of contiguous region involving right buttock and  hip, stage 4 (HCC) 02/10/2021   Pressure injury of left heel, stage 4 (HCC) 02/10/2021   Pressure injury of skin of right hip 01/28/2021   Non-healing open wound of heel, initial encounter 04/12/2020   At risk for falls 02/09/2018   Post-menopausal osteoporosis 02/09/2018   Loosening of prosthetic hip (HCC) 06/01/2016   Encounter for routine gynecological examination with Papanicolaou smear of cervix 03/03/2016    Enlarged uterus 03/03/2016   Postmenopausal 03/03/2016   GAD (generalized anxiety disorder) 01/21/2016   Dislocation of right hip (HCC) 08/01/2015   Status post right hip replacement 07/24/2015   Neurogenic bladder 06/28/2015   Current smoker 05/13/2015   Height loss 05/13/2015   Wheelchair dependent 05/13/2015   History of developmental dysplasia of the hip 04/29/2015   Primary osteoarthritis of knees, bilateral 04/29/2015   Osteopenia    Vitamin D deficiency 05/01/2014   Heterotopic ossification of bone 08/02/2013   Pain of right hip joint 08/02/2013   Cauda equina injury without spinal bone injury (HCC) 05/26/2011   Low back pain 05/26/2011   Osteoarthritis of knee 03/23/2011   Osteoarthritis of thoracolumbar spine 03/23/2011   Left knee pain 02/08/2011   KNEE PAIN 05/23/2007   SPONDYLOSIS WITH MYELOPATHY LUMBAR REGION 05/23/2007    Allergies:  Allergies  Allergen Reactions   Keflex [Cephalexin] Diarrhea   Medications:  Current Outpatient Medications:    buPROPion (WELLBUTRIN SR) 200 MG 12 hr tablet, Take 1 tablet (200 mg total) by mouth 2 (two) times daily., Disp: 180 tablet, Rfl: 1   acetaminophen (TYLENOL) 650 MG suppository, Place 650 mg rectally every 4 (four) hours as needed., Disp: , Rfl:    Ascorbic Acid (VITAMIN C) 500 MG CAPS, Take 500 mg by mouth daily., Disp: 90 capsule, Rfl: 11   baclofen (LIORESAL) 10 MG tablet, Take 1 tablet (10 mg total) by mouth 3 (three) times daily., Disp: 60 each, Rfl: 3   Buprenorphine HCl-Naloxone HCl 8-2 MG FILM, Place 1 Film under the tongue 3 (three) times daily., Disp: , Rfl:    CALCIUM-VITAMIN D PO, Take 1 tablet by mouth daily., Disp: , Rfl:    clonazePAM (KLONOPIN) 0.5 MG tablet, Take 0.5 mg by mouth 2 (two) times daily., Disp: , Rfl:    clotrimazole-betamethasone (LOTRISONE) cream, Apply 1 Application topically daily., Disp: 30 g, Rfl: 2   collagenase (SANTYL) ointment, Apply topically daily., Disp: 30 g, Rfl: 0   Elastic  Bandages & Supports (HEEL/ANKLE PROTECTOR) MISC, 1 each by Does not apply route at bedtime., Disp: 2 each, Rfl: 1   ferrous sulfate 325 (65 FE) MG tablet, Take 1 tablet (325 mg total) by mouth 2 (two) times daily with a meal., Disp: 60 tablet, Rfl: 5   folic acid (FOLVITE) 1 MG tablet, Take 1 tablet (1 mg total) by mouth daily., Disp: 30 tablet, Rfl: 5   Foot Care Products (SOCK AID) MISC, 1 Device by Does not apply route daily., Disp: 1 each, Rfl: 0   gabapentin (NEURONTIN) 300 MG capsule, TAKE (2) CAPSULES BY MOUTH TWICE DAILY., Disp: 120 capsule, Rfl: 2   midodrine (PROAMATINE) 10 MG tablet, Take 1 tablet (10 mg total) by mouth 3 (three) times daily with meals., Disp: 90 tablet, Rfl: 2   mirabegron ER (MYRBETRIQ) 25 MG TB24 tablet, 25 mg., Disp: , Rfl:    Misc. Devices (BED WEDGE) MISC, 1 each by Does not apply route 2 (two) times daily., Disp: 2 each, Rfl: 0   Misc. Devices (TRANSFER BENCH) MISC, 1  Device by Does not apply route as needed., Disp: 1 each, Rfl: 1   Misc. Devices [facility] CUSHION) MISC, 1 application by Does not apply route daily., Disp: 1 each, Rfl: 1   Multiple Vitamins-Calcium (ONE-A-DAY WOMENS PO), Take 2 tablets by mouth daily., Disp: , Rfl:    NARCAN 4 MG/0.1ML LIQD nasal spray kit, , Disp: , Rfl:    Nutritional Supplements (ENSURE HIGH PROTEIN) LIQD, Take 237 mLs by mouth 4 (four) times daily - after meals and at bedtime., Disp: 3792 mL, Rfl: 11   Nutritional Supplements (JUVEN NUTRIVIGOR) PACK, Take 1 each by mouth 3 (three) times daily., Disp: 180 each, Rfl: 3   Nutritional Supplements (PROMOD) LIQD, Take 30 mLs by mouth 2 (two) times daily., Disp: 946 mL, Rfl: 11   nystatin (MYCOSTATIN/NYSTOP) powder, Apply 1 Application topically 3 (three) times daily., Disp: 60 g, Rfl: 2   Plecanatide (TRULANCE) 3 MG TABS, Take 3 mg by mouth daily., Disp: 90 tablet, Rfl: 1   polyethylene glycol powder (GLYCOLAX/MIRALAX) 17 GM/SCOOP powder, Take 17 g by mouth daily., Disp: 3350 g,  Rfl: 1   POTASSIUM PO, Take 50 mg by mouth daily., Disp: , Rfl:    silver sulfADIAZINE (SILVADENE) 1 % cream, Apply 1 application topically daily., Disp: 50 g, Rfl: 0   sodium hypochlorite (DAKIN'S 1/2 STRENGTH) external solution, Apply to wounds as directed, Disp: , Rfl:    sulfamethoxazole-trimethoprim (BACTRIM DS) 800-160 MG tablet, Take 1 tablet by mouth 2 (two) times daily., Disp: , Rfl:    Zinc 50 MG CAPS, Take 1 capsule (50 mg total) by mouth daily., Disp: 90 capsule, Rfl: 1  Observations/Objective: Patient is well-developed, well-nourished in no acute distress.  Resting comfortably  at home.  Head is normocephalic, atraumatic.  No labored breathing.  Speech is clear and coherent with logical content.  Patient is alert and oriented at baseline.    Assessment and Plan: 1. Encounter for smoking cessation counseling - buPROPion (WELLBUTRIN SR) 200 MG 12 hr tablet; Take 1 tablet (200 mg total) by mouth 2 (two) times daily.  Dispense: 180 tablet; Refill: 1  2. Current smoker - buPROPion (WELLBUTRIN SR) 200 MG 12 hr tablet; Take 1 tablet (200 mg total) by mouth 2 (two) times daily.  Dispense: 180 tablet; Refill: 1  3. Depression, major, single episode, mild (HCC) - buPROPion (WELLBUTRIN SR) 200 MG 12 hr tablet; Take 1 tablet (200 mg total) by mouth 2 (two) times daily.  Dispense: 180 tablet; Refill: 1  Will increase Wellbutrin to 200 mg BID from 100 mg BID Smoking cessation encouraged Stress management  Follow up in 4 weeks   Follow Up Instructions: I discussed the assessment and treatment plan with the patient. The patient was provided an opportunity to ask questions and all were answered. The patient agreed with the plan and demonstrated an understanding of the instructions.  A copy of instructions were sent to the patient via MyChart unless otherwise noted below.     The patient was advised to call back or seek an in-person evaluation if the symptoms worsen or if the condition  fails to improve as anticipated.  Time:  I spent 11 minutes with the patient via telehealth technology discussing the above problems/concerns.    [name], FNP

## 2022-08-10 NOTE — Patient Outreach (Signed)
  Care Coordination   08/10/2022 Name: Kim [name] MRN: [id number] DOB: [birth year]   Care Coordination Outreach Attempts:  An unsuccessful telephone outreach was attempted today to offer the patient information about available care coordination services.  Follow Up Plan:  Additional outreach attempts will be made to offer the patient care coordination information and services.   Encounter Outcome:  No Answer   Care Coordination Interventions:  No, not indicated    Anastasya Jewell L. Noelle Penner, RN, BSN, CCM [facility] Care Management Community Coordinator Office number [phone]

## 2022-08-10 NOTE — Patient Instructions (Signed)

## 2022-08-13 DIAGNOSIS — N319 Neuromuscular dysfunction of bladder, unspecified: Secondary | ICD-10-CM | POA: Diagnosis not present

## 2022-08-13 DIAGNOSIS — Z993 Dependence on wheelchair: Secondary | ICD-10-CM | POA: Diagnosis not present

## 2022-08-13 DIAGNOSIS — S73004D Unspecified dislocation of right hip, subsequent encounter: Secondary | ICD-10-CM | POA: Diagnosis not present

## 2022-08-13 DIAGNOSIS — M8668 Other chronic osteomyelitis, other site: Secondary | ICD-10-CM | POA: Diagnosis not present

## 2022-08-13 DIAGNOSIS — S343XXD Injury of cauda equina, subsequent encounter: Secondary | ICD-10-CM | POA: Diagnosis not present

## 2022-08-17 ENCOUNTER — Ambulatory Visit: Payer: 59 | Admitting: Podiatry

## 2022-08-17 DIAGNOSIS — F17210 Nicotine dependence, cigarettes, uncomplicated: Secondary | ICD-10-CM | POA: Diagnosis not present

## 2022-08-17 DIAGNOSIS — G8220 Paraplegia, unspecified: Secondary | ICD-10-CM | POA: Diagnosis not present

## 2022-08-17 DIAGNOSIS — L89314 Pressure ulcer of right buttock, stage 4: Secondary | ICD-10-CM | POA: Diagnosis not present

## 2022-08-17 DIAGNOSIS — G8929 Other chronic pain: Secondary | ICD-10-CM | POA: Diagnosis not present

## 2022-08-17 DIAGNOSIS — L89324 Pressure ulcer of left buttock, stage 4: Secondary | ICD-10-CM | POA: Diagnosis not present

## 2022-08-17 DIAGNOSIS — Z466 Encounter for fitting and adjustment of urinary device: Secondary | ICD-10-CM | POA: Diagnosis not present

## 2022-08-17 DIAGNOSIS — Z9181 History of falling: Secondary | ICD-10-CM | POA: Diagnosis not present

## 2022-08-17 DIAGNOSIS — D649 Anemia, unspecified: Secondary | ICD-10-CM | POA: Diagnosis not present

## 2022-08-19 ENCOUNTER — Ambulatory Visit: Payer: Self-pay | Admitting: *Deleted

## 2022-08-19 DIAGNOSIS — G8220 Paraplegia, unspecified: Secondary | ICD-10-CM | POA: Diagnosis not present

## 2022-08-19 DIAGNOSIS — G8929 Other chronic pain: Secondary | ICD-10-CM | POA: Diagnosis not present

## 2022-08-19 DIAGNOSIS — Z466 Encounter for fitting and adjustment of urinary device: Secondary | ICD-10-CM | POA: Diagnosis not present

## 2022-08-19 DIAGNOSIS — L89324 Pressure ulcer of left buttock, stage 4: Secondary | ICD-10-CM | POA: Diagnosis not present

## 2022-08-19 DIAGNOSIS — Z9181 History of falling: Secondary | ICD-10-CM | POA: Diagnosis not present

## 2022-08-19 DIAGNOSIS — D649 Anemia, unspecified: Secondary | ICD-10-CM | POA: Diagnosis not present

## 2022-08-19 DIAGNOSIS — L89314 Pressure ulcer of right buttock, stage 4: Secondary | ICD-10-CM | POA: Diagnosis not present

## 2022-08-19 DIAGNOSIS — F17210 Nicotine dependence, cigarettes, uncomplicated: Secondary | ICD-10-CM | POA: Diagnosis not present

## 2022-08-19 NOTE — Patient Outreach (Signed)
  Care Coordination   Follow Up Visit Note   06/29/2023 updated note for 08/19/22 Name: Kim [name] MRN: [id number] DOB: 1975-11-11  Kim [name] is a 47 y.o. year old female who sees White, Edilia Bo, FNP for primary care. I spoke with  Kim [name] by phone today.  What matters to the patients health and wellness today?  She is having her home health nurse help with her wound vac  Requests to call RN CM back   Goals Addressed             This Visit's Progress    [facility] care coordination services       Interventions Today    Flowsheet Row Most Recent Value  Chronic Disease   Chronic disease during today's visit Other  [wound]  General Interventions   General Interventions Discussed/Reviewed General Interventions Reviewed, Doctor Visits, Community Resources, Horticulturist, commercial (DME)  Doctor Visits Discussed/Reviewed Doctor Visits Reviewed, PCP  Durable Medical Equipment (DME) Other  [wound vac]  PCP/Specialist Visits Compliance with follow-up visit  Education Interventions   Education Provided Provided Education  [wound care]  Provided Verbal Education On Other              SDOH assessments and interventions completed:  No     Care Coordination Interventions:  Yes, provided   Follow up plan: Follow up call scheduled for pending return call from patient    Encounter Outcome:  Pt. Visit Completed   Kim [name] L. Noelle Penner, RN, BSN, CCM [facility] Care Management Community Coordinator Office number [phone]

## 2022-08-24 ENCOUNTER — Other Ambulatory Visit: Payer: Self-pay | Admitting: Family

## 2022-08-24 DIAGNOSIS — L89324 Pressure ulcer of left buttock, stage 4: Secondary | ICD-10-CM | POA: Diagnosis not present

## 2022-08-24 DIAGNOSIS — L89314 Pressure ulcer of right buttock, stage 4: Secondary | ICD-10-CM | POA: Diagnosis not present

## 2022-08-24 DIAGNOSIS — F17210 Nicotine dependence, cigarettes, uncomplicated: Secondary | ICD-10-CM | POA: Diagnosis not present

## 2022-08-24 DIAGNOSIS — G8220 Paraplegia, unspecified: Secondary | ICD-10-CM | POA: Diagnosis not present

## 2022-08-24 DIAGNOSIS — Z9181 History of falling: Secondary | ICD-10-CM | POA: Diagnosis not present

## 2022-08-24 DIAGNOSIS — M4628 Osteomyelitis of vertebra, sacral and sacrococcygeal region: Secondary | ICD-10-CM | POA: Diagnosis not present

## 2022-08-24 DIAGNOSIS — G8929 Other chronic pain: Secondary | ICD-10-CM | POA: Diagnosis not present

## 2022-08-24 DIAGNOSIS — Z466 Encounter for fitting and adjustment of urinary device: Secondary | ICD-10-CM | POA: Diagnosis not present

## 2022-08-24 DIAGNOSIS — D649 Anemia, unspecified: Secondary | ICD-10-CM | POA: Diagnosis not present

## 2022-08-24 DIAGNOSIS — G6289 Other specified polyneuropathies: Secondary | ICD-10-CM

## 2022-08-26 DIAGNOSIS — L89314 Pressure ulcer of right buttock, stage 4: Secondary | ICD-10-CM | POA: Diagnosis not present

## 2022-08-26 DIAGNOSIS — D649 Anemia, unspecified: Secondary | ICD-10-CM | POA: Diagnosis not present

## 2022-08-26 DIAGNOSIS — Z9181 History of falling: Secondary | ICD-10-CM | POA: Diagnosis not present

## 2022-08-26 DIAGNOSIS — G8929 Other chronic pain: Secondary | ICD-10-CM | POA: Diagnosis not present

## 2022-08-26 DIAGNOSIS — G8220 Paraplegia, unspecified: Secondary | ICD-10-CM | POA: Diagnosis not present

## 2022-08-26 DIAGNOSIS — L89324 Pressure ulcer of left buttock, stage 4: Secondary | ICD-10-CM | POA: Diagnosis not present

## 2022-08-26 DIAGNOSIS — F17210 Nicotine dependence, cigarettes, uncomplicated: Secondary | ICD-10-CM | POA: Diagnosis not present

## 2022-08-26 DIAGNOSIS — Z466 Encounter for fitting and adjustment of urinary device: Secondary | ICD-10-CM | POA: Diagnosis not present

## 2022-08-31 DIAGNOSIS — L89314 Pressure ulcer of right buttock, stage 4: Secondary | ICD-10-CM | POA: Diagnosis not present

## 2022-08-31 DIAGNOSIS — Z9181 History of falling: Secondary | ICD-10-CM | POA: Diagnosis not present

## 2022-08-31 DIAGNOSIS — Z466 Encounter for fitting and adjustment of urinary device: Secondary | ICD-10-CM | POA: Diagnosis not present

## 2022-08-31 DIAGNOSIS — F17210 Nicotine dependence, cigarettes, uncomplicated: Secondary | ICD-10-CM | POA: Diagnosis not present

## 2022-08-31 DIAGNOSIS — G8929 Other chronic pain: Secondary | ICD-10-CM | POA: Diagnosis not present

## 2022-08-31 DIAGNOSIS — D649 Anemia, unspecified: Secondary | ICD-10-CM | POA: Diagnosis not present

## 2022-08-31 DIAGNOSIS — G8220 Paraplegia, unspecified: Secondary | ICD-10-CM | POA: Diagnosis not present

## 2022-08-31 DIAGNOSIS — L89324 Pressure ulcer of left buttock, stage 4: Secondary | ICD-10-CM | POA: Diagnosis not present

## 2022-09-02 DIAGNOSIS — Z466 Encounter for fitting and adjustment of urinary device: Secondary | ICD-10-CM | POA: Diagnosis not present

## 2022-09-02 DIAGNOSIS — Z9181 History of falling: Secondary | ICD-10-CM | POA: Diagnosis not present

## 2022-09-02 DIAGNOSIS — G8220 Paraplegia, unspecified: Secondary | ICD-10-CM | POA: Diagnosis not present

## 2022-09-02 DIAGNOSIS — F17210 Nicotine dependence, cigarettes, uncomplicated: Secondary | ICD-10-CM | POA: Diagnosis not present

## 2022-09-02 DIAGNOSIS — D649 Anemia, unspecified: Secondary | ICD-10-CM | POA: Diagnosis not present

## 2022-09-02 DIAGNOSIS — L89314 Pressure ulcer of right buttock, stage 4: Secondary | ICD-10-CM | POA: Diagnosis not present

## 2022-09-02 DIAGNOSIS — G8929 Other chronic pain: Secondary | ICD-10-CM | POA: Diagnosis not present

## 2022-09-02 DIAGNOSIS — L89324 Pressure ulcer of left buttock, stage 4: Secondary | ICD-10-CM | POA: Diagnosis not present

## 2022-09-05 DIAGNOSIS — L89324 Pressure ulcer of left buttock, stage 4: Secondary | ICD-10-CM | POA: Diagnosis not present

## 2022-09-05 DIAGNOSIS — L89314 Pressure ulcer of right buttock, stage 4: Secondary | ICD-10-CM | POA: Diagnosis not present

## 2022-09-05 DIAGNOSIS — G8220 Paraplegia, unspecified: Secondary | ICD-10-CM | POA: Diagnosis not present

## 2022-09-06 DIAGNOSIS — R339 Retention of urine, unspecified: Secondary | ICD-10-CM | POA: Diagnosis not present

## 2022-09-07 DIAGNOSIS — R339 Retention of urine, unspecified: Secondary | ICD-10-CM | POA: Diagnosis not present

## 2022-09-09 DIAGNOSIS — G8220 Paraplegia, unspecified: Secondary | ICD-10-CM | POA: Diagnosis not present

## 2022-09-09 DIAGNOSIS — D649 Anemia, unspecified: Secondary | ICD-10-CM | POA: Diagnosis not present

## 2022-09-09 DIAGNOSIS — F17210 Nicotine dependence, cigarettes, uncomplicated: Secondary | ICD-10-CM | POA: Diagnosis not present

## 2022-09-09 DIAGNOSIS — L89314 Pressure ulcer of right buttock, stage 4: Secondary | ICD-10-CM | POA: Diagnosis not present

## 2022-09-09 DIAGNOSIS — Z9181 History of falling: Secondary | ICD-10-CM | POA: Diagnosis not present

## 2022-09-09 DIAGNOSIS — G8929 Other chronic pain: Secondary | ICD-10-CM | POA: Diagnosis not present

## 2022-09-09 DIAGNOSIS — Z466 Encounter for fitting and adjustment of urinary device: Secondary | ICD-10-CM | POA: Diagnosis not present

## 2022-09-09 DIAGNOSIS — L89324 Pressure ulcer of left buttock, stage 4: Secondary | ICD-10-CM | POA: Diagnosis not present

## 2022-09-14 DIAGNOSIS — D649 Anemia, unspecified: Secondary | ICD-10-CM | POA: Diagnosis not present

## 2022-09-14 DIAGNOSIS — Z9181 History of falling: Secondary | ICD-10-CM | POA: Diagnosis not present

## 2022-09-14 DIAGNOSIS — L89324 Pressure ulcer of left buttock, stage 4: Secondary | ICD-10-CM | POA: Diagnosis not present

## 2022-09-14 DIAGNOSIS — F17210 Nicotine dependence, cigarettes, uncomplicated: Secondary | ICD-10-CM | POA: Diagnosis not present

## 2022-09-14 DIAGNOSIS — Z466 Encounter for fitting and adjustment of urinary device: Secondary | ICD-10-CM | POA: Diagnosis not present

## 2022-09-14 DIAGNOSIS — L89314 Pressure ulcer of right buttock, stage 4: Secondary | ICD-10-CM | POA: Diagnosis not present

## 2022-09-14 DIAGNOSIS — G8220 Paraplegia, unspecified: Secondary | ICD-10-CM | POA: Diagnosis not present

## 2022-09-14 DIAGNOSIS — G8929 Other chronic pain: Secondary | ICD-10-CM | POA: Diagnosis not present

## 2022-09-17 DIAGNOSIS — L89214 Pressure ulcer of right hip, stage 4: Secondary | ICD-10-CM | POA: Diagnosis not present

## 2022-09-17 DIAGNOSIS — L89224 Pressure ulcer of left hip, stage 4: Secondary | ICD-10-CM | POA: Diagnosis not present

## 2022-09-17 DIAGNOSIS — Z79899 Other long term (current) drug therapy: Secondary | ICD-10-CM | POA: Diagnosis not present

## 2022-09-17 DIAGNOSIS — Z9889 Other specified postprocedural states: Secondary | ICD-10-CM | POA: Diagnosis not present

## 2022-09-17 DIAGNOSIS — M869 Osteomyelitis, unspecified: Secondary | ICD-10-CM | POA: Diagnosis not present

## 2022-09-17 DIAGNOSIS — F17210 Nicotine dependence, cigarettes, uncomplicated: Secondary | ICD-10-CM | POA: Diagnosis not present

## 2022-09-17 DIAGNOSIS — D649 Anemia, unspecified: Secondary | ICD-10-CM | POA: Diagnosis not present

## 2022-09-17 DIAGNOSIS — E46 Unspecified protein-calorie malnutrition: Secondary | ICD-10-CM | POA: Diagnosis not present

## 2022-09-21 DIAGNOSIS — L89324 Pressure ulcer of left buttock, stage 4: Secondary | ICD-10-CM | POA: Diagnosis not present

## 2022-09-21 DIAGNOSIS — D649 Anemia, unspecified: Secondary | ICD-10-CM | POA: Diagnosis not present

## 2022-09-21 DIAGNOSIS — Z9181 History of falling: Secondary | ICD-10-CM | POA: Diagnosis not present

## 2022-09-21 DIAGNOSIS — L89314 Pressure ulcer of right buttock, stage 4: Secondary | ICD-10-CM | POA: Diagnosis not present

## 2022-09-21 DIAGNOSIS — G8220 Paraplegia, unspecified: Secondary | ICD-10-CM | POA: Diagnosis not present

## 2022-09-21 DIAGNOSIS — Z466 Encounter for fitting and adjustment of urinary device: Secondary | ICD-10-CM | POA: Diagnosis not present

## 2022-09-21 DIAGNOSIS — F17210 Nicotine dependence, cigarettes, uncomplicated: Secondary | ICD-10-CM | POA: Diagnosis not present

## 2022-09-21 DIAGNOSIS — G8929 Other chronic pain: Secondary | ICD-10-CM | POA: Diagnosis not present

## 2022-09-23 ENCOUNTER — Other Ambulatory Visit: Payer: Self-pay | Admitting: Family

## 2022-09-23 DIAGNOSIS — G6289 Other specified polyneuropathies: Secondary | ICD-10-CM

## 2022-09-23 NOTE — Telephone Encounter (Signed)
Appt scheduled for 07/16 pt needs refill

## 2022-09-23 NOTE — Telephone Encounter (Signed)
[facility] pt NTBS 30-d given 08/24/22

## 2022-09-25 ENCOUNTER — Telehealth: Payer: Self-pay | Admitting: Family

## 2022-09-25 DIAGNOSIS — Z466 Encounter for fitting and adjustment of urinary device: Secondary | ICD-10-CM | POA: Diagnosis not present

## 2022-09-25 DIAGNOSIS — F17210 Nicotine dependence, cigarettes, uncomplicated: Secondary | ICD-10-CM | POA: Diagnosis not present

## 2022-09-25 DIAGNOSIS — G8220 Paraplegia, unspecified: Secondary | ICD-10-CM | POA: Diagnosis not present

## 2022-09-25 DIAGNOSIS — L89324 Pressure ulcer of left buttock, stage 4: Secondary | ICD-10-CM | POA: Diagnosis not present

## 2022-09-25 DIAGNOSIS — Z9181 History of falling: Secondary | ICD-10-CM | POA: Diagnosis not present

## 2022-09-25 DIAGNOSIS — D649 Anemia, unspecified: Secondary | ICD-10-CM | POA: Diagnosis not present

## 2022-09-25 DIAGNOSIS — L89314 Pressure ulcer of right buttock, stage 4: Secondary | ICD-10-CM | POA: Diagnosis not present

## 2022-09-25 DIAGNOSIS — G8929 Other chronic pain: Secondary | ICD-10-CM | POA: Diagnosis not present

## 2022-09-25 NOTE — Telephone Encounter (Signed)
Appt made

## 2022-09-28 ENCOUNTER — Encounter: Payer: Self-pay | Admitting: Family

## 2022-09-28 ENCOUNTER — Telehealth (INDEPENDENT_AMBULATORY_CARE_PROVIDER_SITE_OTHER): Payer: 59 | Admitting: Family

## 2022-09-28 DIAGNOSIS — L89314 Pressure ulcer of right buttock, stage 4: Secondary | ICD-10-CM | POA: Diagnosis not present

## 2022-09-28 DIAGNOSIS — F411 Generalized anxiety disorder: Secondary | ICD-10-CM | POA: Diagnosis not present

## 2022-09-28 DIAGNOSIS — F17200 Nicotine dependence, unspecified, uncomplicated: Secondary | ICD-10-CM | POA: Diagnosis not present

## 2022-09-28 DIAGNOSIS — S343XXD Injury of cauda equina, subsequent encounter: Secondary | ICD-10-CM

## 2022-09-28 DIAGNOSIS — L89324 Pressure ulcer of left buttock, stage 4: Secondary | ICD-10-CM | POA: Diagnosis not present

## 2022-09-28 DIAGNOSIS — G6289 Other specified polyneuropathies: Secondary | ICD-10-CM | POA: Diagnosis not present

## 2022-09-28 DIAGNOSIS — S91309D Unspecified open wound, unspecified foot, subsequent encounter: Secondary | ICD-10-CM

## 2022-09-28 DIAGNOSIS — I959 Hypotension, unspecified: Secondary | ICD-10-CM | POA: Diagnosis not present

## 2022-09-28 DIAGNOSIS — N319 Neuromuscular dysfunction of bladder, unspecified: Secondary | ICD-10-CM

## 2022-09-28 DIAGNOSIS — L8944 Pressure ulcer of contiguous site of back, buttock and hip, stage 4: Secondary | ICD-10-CM

## 2022-09-28 DIAGNOSIS — E43 Unspecified severe protein-calorie malnutrition: Secondary | ICD-10-CM

## 2022-09-28 DIAGNOSIS — S91309A Unspecified open wound, unspecified foot, initial encounter: Secondary | ICD-10-CM

## 2022-09-28 DIAGNOSIS — S343XXS Injury of cauda equina, sequela: Secondary | ICD-10-CM

## 2022-09-28 DIAGNOSIS — Z466 Encounter for fitting and adjustment of urinary device: Secondary | ICD-10-CM | POA: Diagnosis not present

## 2022-09-28 DIAGNOSIS — F17210 Nicotine dependence, cigarettes, uncomplicated: Secondary | ICD-10-CM | POA: Diagnosis not present

## 2022-09-28 DIAGNOSIS — D649 Anemia, unspecified: Secondary | ICD-10-CM | POA: Diagnosis not present

## 2022-09-28 DIAGNOSIS — G8929 Other chronic pain: Secondary | ICD-10-CM | POA: Diagnosis not present

## 2022-09-28 DIAGNOSIS — G8220 Paraplegia, unspecified: Secondary | ICD-10-CM | POA: Diagnosis not present

## 2022-09-28 DIAGNOSIS — Z9181 History of falling: Secondary | ICD-10-CM | POA: Diagnosis not present

## 2022-09-28 MED ORDER — MIDODRINE HCL 10 MG PO TABS: 10.0000 mg | ORAL_TABLET | Freq: Three times a day (TID) | ORAL | 2 refills | Status: DC

## 2022-09-28 MED ORDER — GABAPENTIN 300 MG PO CAPS
ORAL_CAPSULE | ORAL | 2 refills | Status: DC
Start: 2022-09-28 — End: 2022-12-28

## 2022-09-28 NOTE — Progress Notes (Signed)
Virtual Visit Consent   Kim [name], you are scheduled for a virtual visit with a [facility] provider today. Just as with appointments in the office, your consent must be obtained to participate. Your consent will be active for this visit and any virtual visit you may have with one of our providers in the next 365 days. If you have a MyChart account, a copy of this consent can be sent to you electronically.  As this is a virtual visit, video technology does not allow for your provider to perform a traditional examination. This may limit your provider's ability to fully assess your condition. If your provider identifies any concerns that need to be evaluated in person or the need to arrange testing (such as labs, EKG, etc.), we will make arrangements to do so. Although advances in technology are sophisticated, we cannot ensure that it will always work on either your end or our end. If the connection with a video visit is poor, the visit may have to be switched to a telephone visit. With either a video or telephone visit, we are not always able to ensure that we have a secure connection.  By engaging in this virtual visit, you consent to the provision of healthcare and authorize for your insurance to be billed (if applicable) for the services provided during this visit. Depending on your insurance coverage, you may receive a charge related to this service.  I need to obtain your verbal consent now. Are you willing to proceed with your visit today? [name] has provided verbal consent on 09/28/2022 for a virtual visit (video or telephone). [name], FNP  Date: 09/28/2022 2:59 PM  Virtual Visit via Video Note   I, [name], connected with  Kim [name]  ([id number], 09/04/1975) on 09/28/22 at  2:25 PM EDT by a video-enabled telemedicine application and verified that I am speaking with the correct person using two identifiers.  Location: Patient: Virtual Visit Location  Patient: Home Provider: Virtual Visit Location Provider: Home Office   I discussed the limitations of evaluation and management by telemedicine and the availability of in person appointments. The patient expressed understanding and agreed to proceed.    History of Present Illness: Kim [name] is a 47 y.o. who identifies as a female who was assigned female at birth, and is being seen today for chronic follow up. She is followed ortho surgeon and hx of failed right hip replacement. This was removed 09/2021.   She has cauda equina spinal cord injury and multiple pressure ulcers. She has a wound vac right buttocks and left buttock and is followed by wound care. She has home health nurse coming M,W, F.  She has an aid 11 hours a day to help with daily activities since she is bed bound. She is suppose to be out of bed only a hour a day.   She is followed by ID and currently bactrim BID and topical vancomycin.    She is also followed by suboxone clinic.     She is followed by Urologists every 6 months for neurogenic bladder. She has bladder spasms and has had a catheter since 03/2021.  Home health changes this out once a month.    She has hypotension and takes midodrine.    She takes gabapentin 600 mg BID for polyneuropathy.   HPI: Arthritis Presents for follow-up visit. She complains of pain and stiffness. Affected locations include the left knee, right knee, left MCP and right MCP.  Her pain is at a severity of 5/10.  Back Pain This is a chronic problem. The current episode started more than 1 year ago. The problem occurs intermittently. The problem has been waxing and waning since onset. The pain is present in the lumbar spine. The quality of the pain is described as aching. The pain is at a severity of 7/10. The pain is moderate. The treatment provided moderate relief.  Anxiety Presents for follow-up visit. Symptoms include depressed mood, excessive worry, nervous/anxious behavior and  restlessness. Patient reports no shortness of breath. Symptoms occur occasionally. The severity of symptoms is moderate.    Nicotine Dependence Presents for follow-up visit. Her urge triggers include company of smokers. The symptoms have been stable. She smokes < 1/2 a pack of cigarettes per day.    Problems:  Patient Active Problem List   Diagnosis Date Noted   Hypotension 05/19/2022   Peripheral neuropathy 05/19/2022   Osteomyelitis of right femur (HCC) 07/09/2021   Malnutrition of moderate degree 04/16/2021   Chronic osteomyelitis of left foot with draining sinus (HCC)    Severe protein-calorie malnutrition (HCC) 04/08/2021   Sepsis with acute organ dysfunction (HCC) 04/04/2021   Pressure injury of left hip, stage 3 (HCC) 03/04/2021   Pressure injury of contiguous region involving right buttock and hip, stage 4 (HCC) 02/10/2021   Pressure injury of left heel, stage 4 (HCC) 02/10/2021   Pressure injury of skin of right hip 01/28/2021   Non-healing open wound of heel, initial encounter 04/12/2020   At risk for falls 02/09/2018   Post-menopausal osteoporosis 02/09/2018   Loosening of prosthetic hip (HCC) 06/01/2016   Encounter for routine gynecological examination with Papanicolaou smear of cervix 03/03/2016   Enlarged uterus 03/03/2016   Postmenopausal 03/03/2016   GAD (generalized anxiety disorder) 01/21/2016   Dislocation of right hip (HCC) 08/01/2015   Status post right hip replacement 07/24/2015   Neurogenic bladder 06/28/2015   Current smoker 05/13/2015   Height loss 05/13/2015   Wheelchair dependent 05/13/2015   History of developmental dysplasia of the hip 04/29/2015   Primary osteoarthritis of knees, bilateral 04/29/2015   Osteopenia    Vitamin D deficiency 05/01/2014   Heterotopic ossification of bone 08/02/2013   Pain of right hip joint 08/02/2013   Cauda equina injury without spinal bone injury (HCC) 05/26/2011   Low back pain 05/26/2011   Osteoarthritis of knee  03/23/2011   Osteoarthritis of thoracolumbar spine 03/23/2011   Left knee pain 02/08/2011   KNEE PAIN 05/23/2007   SPONDYLOSIS WITH MYELOPATHY LUMBAR REGION 05/23/2007    Allergies:  Allergies  Allergen Reactions   Keflex [Cephalexin] Diarrhea   Medications:  Current Outpatient Medications:    acetaminophen (TYLENOL) 650 MG suppository, Place 650 mg rectally every 4 (four) hours as needed., Disp: , Rfl:    Ascorbic Acid (VITAMIN C) 500 MG CAPS, Take 500 mg by mouth daily., Disp: 90 capsule, Rfl: 11   baclofen (LIORESAL) 10 MG tablet, Take 1 tablet (10 mg total) by mouth 3 (three) times daily., Disp: 60 each, Rfl: 3   Buprenorphine HCl-Naloxone HCl 8-2 MG FILM, Place 1 Film under the tongue 3 (three) times daily., Disp: , Rfl:    buPROPion (WELLBUTRIN SR) 200 MG 12 hr tablet, Take 1 tablet (200 mg total) by mouth 2 (two) times daily., Disp: 180 tablet, Rfl: 1   CALCIUM-VITAMIN D PO, Take 1 tablet by mouth daily., Disp: , Rfl:    clonazePAM (KLONOPIN) 0.5 MG tablet, Take 0.5 mg by mouth  2 (two) times daily., Disp: , Rfl:    clotrimazole-betamethasone (LOTRISONE) cream, Apply 1 Application topically daily., Disp: 30 g, Rfl: 2   collagenase (SANTYL) ointment, Apply topically daily., Disp: 30 g, Rfl: 0   Elastic Bandages & Supports (HEEL/ANKLE PROTECTOR) MISC, 1 each by Does not apply route at bedtime., Disp: 2 each, Rfl: 1   ferrous sulfate 325 (65 FE) MG tablet, Take 1 tablet (325 mg total) by mouth 2 (two) times daily with a meal., Disp: 60 tablet, Rfl: 5   folic acid (FOLVITE) 1 MG tablet, Take 1 tablet (1 mg total) by mouth daily., Disp: 30 tablet, Rfl: 5   Foot Care Products (SOCK AID) MISC, 1 Device by Does not apply route daily., Disp: 1 each, Rfl: 0   gabapentin (NEURONTIN) 300 MG capsule, TAKE (2) CAPSULES BY MOUTH TWICE DAILY. (NEEDS TO BE SEEN BEFORE NEXT REFILL), Disp: 120 capsule, Rfl: 2   midodrine (PROAMATINE) 10 MG tablet, Take 1 tablet (10 mg total) by mouth 3 (three) times  daily with meals., Disp: 90 tablet, Rfl: 2   mirabegron ER (MYRBETRIQ) 25 MG TB24 tablet, 25 mg., Disp: , Rfl:    Misc. Devices (BED WEDGE) MISC, 1 each by Does not apply route 2 (two) times daily., Disp: 2 each, Rfl: 0   Misc. Devices (TRANSFER BENCH) MISC, 1 Device by Does not apply route as needed., Disp: 1 each, Rfl: 1   Misc. Devices [facility] CUSHION) MISC, 1 application by Does not apply route daily., Disp: 1 each, Rfl: 1   Multiple Vitamins-Calcium (ONE-A-DAY WOMENS PO), Take 2 tablets by mouth daily., Disp: , Rfl:    NARCAN 4 MG/0.1ML LIQD nasal spray kit, , Disp: , Rfl:    Nutritional Supplements (ENSURE HIGH PROTEIN) LIQD, Take 237 mLs by mouth 4 (four) times daily - after meals and at bedtime., Disp: 3792 mL, Rfl: 11   Nutritional Supplements (JUVEN NUTRIVIGOR) PACK, Take 1 each by mouth 3 (three) times daily., Disp: 180 each, Rfl: 3   Nutritional Supplements (PROMOD) LIQD, Take 30 mLs by mouth 2 (two) times daily., Disp: 946 mL, Rfl: 11   nystatin (MYCOSTATIN/NYSTOP) powder, Apply 1 Application topically 3 (three) times daily., Disp: 60 g, Rfl: 2   Plecanatide (TRULANCE) 3 MG TABS, Take 3 mg by mouth daily., Disp: 90 tablet, Rfl: 1   polyethylene glycol powder (GLYCOLAX/MIRALAX) 17 GM/SCOOP powder, Take 17 g by mouth daily., Disp: 3350 g, Rfl: 1   POTASSIUM PO, Take 50 mg by mouth daily., Disp: , Rfl:    silver sulfADIAZINE (SILVADENE) 1 % cream, Apply 1 application topically daily., Disp: 50 g, Rfl: 0   sodium hypochlorite (DAKIN'S 1/2 STRENGTH) external solution, Apply to wounds as directed, Disp: , Rfl:    sulfamethoxazole-trimethoprim (BACTRIM DS) 800-160 MG tablet, Take 1 tablet by mouth 2 (two) times daily., Disp: , Rfl:    Zinc 50 MG CAPS, Take 1 capsule (50 mg total) by mouth daily., Disp: 90 capsule, Rfl: 1  Observations/Objective: Patient is well-developed, well-nourished in no acute distress.  Resting comfortably in bed Head is normocephalic, atraumatic.  No labored  breathing.  Speech is clear and coherent with logical content.  Patient is alert and oriented at baseline.  Wound vac in place  Assessment and Plan: 1. Cauda equina injury without spinal bone injury, sequela  2. Current smoker  3. GAD (generalized anxiety disorder)  4. Hypotension, unspecified hypotension type - midodrine (PROAMATINE) 10 MG tablet; Take 1 tablet (10 mg total) by mouth 3 (  three) times daily with meals.  Dispense: 90 tablet; Refill: 2  5. Neurogenic bladder  6. Non-healing open wound of heel, initial encounter  7. Severe protein-calorie malnutrition (HCC)  8. Pressure injury of contiguous region involving right buttock and hip, stage 4 (HCC)  9. Other polyneuropathy - gabapentin (NEURONTIN) 300 MG capsule; TAKE (2) CAPSULES BY MOUTH TWICE DAILY. (NEEDS TO BE SEEN BEFORE NEXT REFILL)  Dispense: 120 capsule; Refill: 2  Continue all medications Keep all specialists follow up Continue with Home health and wound care Follow up in 3 months   Follow Up Instructions: I discussed the assessment and treatment plan with the patient. The patient was provided an opportunity to ask questions and all were answered. The patient agreed with the plan and demonstrated an understanding of the instructions.  A copy of instructions were sent to the patient via MyChart unless otherwise noted below.     The patient was advised to call back or seek an in-person evaluation if the symptoms worsen or if the condition fails to improve as anticipated.  Time:  I spent 12 minutes with the patient via telehealth technology discussing the above problems/concerns.    [name], FNP

## 2022-10-05 DIAGNOSIS — L89314 Pressure ulcer of right buttock, stage 4: Secondary | ICD-10-CM | POA: Diagnosis not present

## 2022-10-05 DIAGNOSIS — G8220 Paraplegia, unspecified: Secondary | ICD-10-CM | POA: Diagnosis not present

## 2022-10-05 DIAGNOSIS — L89324 Pressure ulcer of left buttock, stage 4: Secondary | ICD-10-CM | POA: Diagnosis not present

## 2022-10-06 DIAGNOSIS — Z466 Encounter for fitting and adjustment of urinary device: Secondary | ICD-10-CM | POA: Diagnosis not present

## 2022-10-06 DIAGNOSIS — L89324 Pressure ulcer of left buttock, stage 4: Secondary | ICD-10-CM | POA: Diagnosis not present

## 2022-10-10 DIAGNOSIS — G8929 Other chronic pain: Secondary | ICD-10-CM | POA: Diagnosis not present

## 2022-10-10 DIAGNOSIS — Z466 Encounter for fitting and adjustment of urinary device: Secondary | ICD-10-CM | POA: Diagnosis not present

## 2022-10-10 DIAGNOSIS — D649 Anemia, unspecified: Secondary | ICD-10-CM | POA: Diagnosis not present

## 2022-10-10 DIAGNOSIS — L89314 Pressure ulcer of right buttock, stage 4: Secondary | ICD-10-CM | POA: Diagnosis not present

## 2022-10-10 DIAGNOSIS — F17210 Nicotine dependence, cigarettes, uncomplicated: Secondary | ICD-10-CM | POA: Diagnosis not present

## 2022-10-10 DIAGNOSIS — G8220 Paraplegia, unspecified: Secondary | ICD-10-CM | POA: Diagnosis not present

## 2022-10-10 DIAGNOSIS — Z9181 History of falling: Secondary | ICD-10-CM | POA: Diagnosis not present

## 2022-10-10 DIAGNOSIS — L89324 Pressure ulcer of left buttock, stage 4: Secondary | ICD-10-CM | POA: Diagnosis not present

## 2022-10-13 DIAGNOSIS — Z466 Encounter for fitting and adjustment of urinary device: Secondary | ICD-10-CM | POA: Diagnosis not present

## 2022-10-13 DIAGNOSIS — G8220 Paraplegia, unspecified: Secondary | ICD-10-CM | POA: Diagnosis not present

## 2022-10-13 DIAGNOSIS — L89314 Pressure ulcer of right buttock, stage 4: Secondary | ICD-10-CM | POA: Diagnosis not present

## 2022-10-13 DIAGNOSIS — G8929 Other chronic pain: Secondary | ICD-10-CM | POA: Diagnosis not present

## 2022-10-13 DIAGNOSIS — F17210 Nicotine dependence, cigarettes, uncomplicated: Secondary | ICD-10-CM | POA: Diagnosis not present

## 2022-10-13 DIAGNOSIS — Z9181 History of falling: Secondary | ICD-10-CM | POA: Diagnosis not present

## 2022-10-13 DIAGNOSIS — D649 Anemia, unspecified: Secondary | ICD-10-CM | POA: Diagnosis not present

## 2022-10-13 DIAGNOSIS — L89324 Pressure ulcer of left buttock, stage 4: Secondary | ICD-10-CM | POA: Diagnosis not present

## 2022-10-15 DIAGNOSIS — Z466 Encounter for fitting and adjustment of urinary device: Secondary | ICD-10-CM | POA: Diagnosis not present

## 2022-10-15 DIAGNOSIS — G8929 Other chronic pain: Secondary | ICD-10-CM | POA: Diagnosis not present

## 2022-10-15 DIAGNOSIS — D649 Anemia, unspecified: Secondary | ICD-10-CM | POA: Diagnosis not present

## 2022-10-15 DIAGNOSIS — L89324 Pressure ulcer of left buttock, stage 4: Secondary | ICD-10-CM | POA: Diagnosis not present

## 2022-10-15 DIAGNOSIS — L89314 Pressure ulcer of right buttock, stage 4: Secondary | ICD-10-CM | POA: Diagnosis not present

## 2022-10-15 DIAGNOSIS — F17210 Nicotine dependence, cigarettes, uncomplicated: Secondary | ICD-10-CM | POA: Diagnosis not present

## 2022-10-15 DIAGNOSIS — Z9181 History of falling: Secondary | ICD-10-CM | POA: Diagnosis not present

## 2022-10-15 DIAGNOSIS — G8220 Paraplegia, unspecified: Secondary | ICD-10-CM | POA: Diagnosis not present

## 2022-10-20 ENCOUNTER — Telehealth: Payer: 59 | Admitting: Family

## 2022-10-20 DIAGNOSIS — G8220 Paraplegia, unspecified: Secondary | ICD-10-CM | POA: Diagnosis not present

## 2022-10-20 DIAGNOSIS — D649 Anemia, unspecified: Secondary | ICD-10-CM | POA: Diagnosis not present

## 2022-10-20 DIAGNOSIS — Z9181 History of falling: Secondary | ICD-10-CM | POA: Diagnosis not present

## 2022-10-20 DIAGNOSIS — F17210 Nicotine dependence, cigarettes, uncomplicated: Secondary | ICD-10-CM | POA: Diagnosis not present

## 2022-10-20 DIAGNOSIS — Z466 Encounter for fitting and adjustment of urinary device: Secondary | ICD-10-CM | POA: Diagnosis not present

## 2022-10-20 DIAGNOSIS — L89314 Pressure ulcer of right buttock, stage 4: Secondary | ICD-10-CM | POA: Diagnosis not present

## 2022-10-20 DIAGNOSIS — G8929 Other chronic pain: Secondary | ICD-10-CM | POA: Diagnosis not present

## 2022-10-20 DIAGNOSIS — L89324 Pressure ulcer of left buttock, stage 4: Secondary | ICD-10-CM | POA: Diagnosis not present

## 2022-10-23 DIAGNOSIS — L89314 Pressure ulcer of right buttock, stage 4: Secondary | ICD-10-CM | POA: Diagnosis not present

## 2022-10-23 DIAGNOSIS — D649 Anemia, unspecified: Secondary | ICD-10-CM | POA: Diagnosis not present

## 2022-10-23 DIAGNOSIS — G8220 Paraplegia, unspecified: Secondary | ICD-10-CM | POA: Diagnosis not present

## 2022-10-23 DIAGNOSIS — Z466 Encounter for fitting and adjustment of urinary device: Secondary | ICD-10-CM | POA: Diagnosis not present

## 2022-10-23 DIAGNOSIS — Z9181 History of falling: Secondary | ICD-10-CM | POA: Diagnosis not present

## 2022-10-23 DIAGNOSIS — F17210 Nicotine dependence, cigarettes, uncomplicated: Secondary | ICD-10-CM | POA: Diagnosis not present

## 2022-10-23 DIAGNOSIS — L89324 Pressure ulcer of left buttock, stage 4: Secondary | ICD-10-CM | POA: Diagnosis not present

## 2022-10-23 DIAGNOSIS — G8929 Other chronic pain: Secondary | ICD-10-CM | POA: Diagnosis not present

## 2022-10-27 DIAGNOSIS — G8220 Paraplegia, unspecified: Secondary | ICD-10-CM | POA: Diagnosis not present

## 2022-10-27 DIAGNOSIS — G8929 Other chronic pain: Secondary | ICD-10-CM | POA: Diagnosis not present

## 2022-10-27 DIAGNOSIS — F17210 Nicotine dependence, cigarettes, uncomplicated: Secondary | ICD-10-CM | POA: Diagnosis not present

## 2022-10-27 DIAGNOSIS — L89314 Pressure ulcer of right buttock, stage 4: Secondary | ICD-10-CM | POA: Diagnosis not present

## 2022-10-27 DIAGNOSIS — Z9181 History of falling: Secondary | ICD-10-CM | POA: Diagnosis not present

## 2022-10-27 DIAGNOSIS — Z466 Encounter for fitting and adjustment of urinary device: Secondary | ICD-10-CM | POA: Diagnosis not present

## 2022-10-27 DIAGNOSIS — L89324 Pressure ulcer of left buttock, stage 4: Secondary | ICD-10-CM | POA: Diagnosis not present

## 2022-10-27 DIAGNOSIS — D649 Anemia, unspecified: Secondary | ICD-10-CM | POA: Diagnosis not present

## 2022-11-03 DIAGNOSIS — Z466 Encounter for fitting and adjustment of urinary device: Secondary | ICD-10-CM | POA: Diagnosis not present

## 2022-11-03 DIAGNOSIS — L89324 Pressure ulcer of left buttock, stage 4: Secondary | ICD-10-CM | POA: Diagnosis not present

## 2022-11-03 DIAGNOSIS — Z9181 History of falling: Secondary | ICD-10-CM | POA: Diagnosis not present

## 2022-11-03 DIAGNOSIS — G8220 Paraplegia, unspecified: Secondary | ICD-10-CM | POA: Diagnosis not present

## 2022-11-03 DIAGNOSIS — G8929 Other chronic pain: Secondary | ICD-10-CM | POA: Diagnosis not present

## 2022-11-03 DIAGNOSIS — F17210 Nicotine dependence, cigarettes, uncomplicated: Secondary | ICD-10-CM | POA: Diagnosis not present

## 2022-11-03 DIAGNOSIS — D649 Anemia, unspecified: Secondary | ICD-10-CM | POA: Diagnosis not present

## 2022-11-03 DIAGNOSIS — L89314 Pressure ulcer of right buttock, stage 4: Secondary | ICD-10-CM | POA: Diagnosis not present

## 2022-11-05 DIAGNOSIS — G8220 Paraplegia, unspecified: Secondary | ICD-10-CM | POA: Diagnosis not present

## 2022-11-05 DIAGNOSIS — L89314 Pressure ulcer of right buttock, stage 4: Secondary | ICD-10-CM | POA: Diagnosis not present

## 2022-11-05 DIAGNOSIS — L89324 Pressure ulcer of left buttock, stage 4: Secondary | ICD-10-CM | POA: Diagnosis not present

## 2022-11-06 DIAGNOSIS — Z466 Encounter for fitting and adjustment of urinary device: Secondary | ICD-10-CM | POA: Diagnosis not present

## 2022-11-06 DIAGNOSIS — D649 Anemia, unspecified: Secondary | ICD-10-CM | POA: Diagnosis not present

## 2022-11-06 DIAGNOSIS — L89324 Pressure ulcer of left buttock, stage 4: Secondary | ICD-10-CM | POA: Diagnosis not present

## 2022-11-06 DIAGNOSIS — F17210 Nicotine dependence, cigarettes, uncomplicated: Secondary | ICD-10-CM | POA: Diagnosis not present

## 2022-11-06 DIAGNOSIS — G8929 Other chronic pain: Secondary | ICD-10-CM | POA: Diagnosis not present

## 2022-11-06 DIAGNOSIS — Z9181 History of falling: Secondary | ICD-10-CM | POA: Diagnosis not present

## 2022-11-06 DIAGNOSIS — G8220 Paraplegia, unspecified: Secondary | ICD-10-CM | POA: Diagnosis not present

## 2022-11-06 DIAGNOSIS — L89314 Pressure ulcer of right buttock, stage 4: Secondary | ICD-10-CM | POA: Diagnosis not present

## 2022-11-10 DIAGNOSIS — Z466 Encounter for fitting and adjustment of urinary device: Secondary | ICD-10-CM | POA: Diagnosis not present

## 2022-11-10 DIAGNOSIS — L89314 Pressure ulcer of right buttock, stage 4: Secondary | ICD-10-CM | POA: Diagnosis not present

## 2022-11-10 DIAGNOSIS — F17210 Nicotine dependence, cigarettes, uncomplicated: Secondary | ICD-10-CM | POA: Diagnosis not present

## 2022-11-10 DIAGNOSIS — G8220 Paraplegia, unspecified: Secondary | ICD-10-CM | POA: Diagnosis not present

## 2022-11-10 DIAGNOSIS — D649 Anemia, unspecified: Secondary | ICD-10-CM | POA: Diagnosis not present

## 2022-11-10 DIAGNOSIS — L89324 Pressure ulcer of left buttock, stage 4: Secondary | ICD-10-CM | POA: Diagnosis not present

## 2022-11-10 DIAGNOSIS — G8929 Other chronic pain: Secondary | ICD-10-CM | POA: Diagnosis not present

## 2022-11-10 DIAGNOSIS — Z9181 History of falling: Secondary | ICD-10-CM | POA: Diagnosis not present

## 2022-11-11 DIAGNOSIS — M25561 Pain in right knee: Secondary | ICD-10-CM | POA: Diagnosis not present

## 2022-11-11 DIAGNOSIS — R269 Unspecified abnormalities of gait and mobility: Secondary | ICD-10-CM | POA: Diagnosis not present

## 2022-11-11 DIAGNOSIS — L89154 Pressure ulcer of sacral region, stage 4: Secondary | ICD-10-CM | POA: Diagnosis not present

## 2022-11-11 DIAGNOSIS — G8220 Paraplegia, unspecified: Secondary | ICD-10-CM | POA: Diagnosis not present

## 2022-11-16 DIAGNOSIS — D649 Anemia, unspecified: Secondary | ICD-10-CM | POA: Diagnosis not present

## 2022-11-16 DIAGNOSIS — L89324 Pressure ulcer of left buttock, stage 4: Secondary | ICD-10-CM | POA: Diagnosis not present

## 2022-11-16 DIAGNOSIS — G8929 Other chronic pain: Secondary | ICD-10-CM | POA: Diagnosis not present

## 2022-11-16 DIAGNOSIS — F17210 Nicotine dependence, cigarettes, uncomplicated: Secondary | ICD-10-CM | POA: Diagnosis not present

## 2022-11-16 DIAGNOSIS — Z466 Encounter for fitting and adjustment of urinary device: Secondary | ICD-10-CM | POA: Diagnosis not present

## 2022-11-16 DIAGNOSIS — L89314 Pressure ulcer of right buttock, stage 4: Secondary | ICD-10-CM | POA: Diagnosis not present

## 2022-11-16 DIAGNOSIS — Z9181 History of falling: Secondary | ICD-10-CM | POA: Diagnosis not present

## 2022-11-16 DIAGNOSIS — G8220 Paraplegia, unspecified: Secondary | ICD-10-CM | POA: Diagnosis not present

## 2022-11-17 DIAGNOSIS — Z466 Encounter for fitting and adjustment of urinary device: Secondary | ICD-10-CM | POA: Diagnosis not present

## 2022-11-17 DIAGNOSIS — F17210 Nicotine dependence, cigarettes, uncomplicated: Secondary | ICD-10-CM | POA: Diagnosis not present

## 2022-11-17 DIAGNOSIS — L89314 Pressure ulcer of right buttock, stage 4: Secondary | ICD-10-CM | POA: Diagnosis not present

## 2022-11-17 DIAGNOSIS — G8220 Paraplegia, unspecified: Secondary | ICD-10-CM | POA: Diagnosis not present

## 2022-11-17 DIAGNOSIS — R339 Retention of urine, unspecified: Secondary | ICD-10-CM | POA: Diagnosis not present

## 2022-11-17 DIAGNOSIS — D649 Anemia, unspecified: Secondary | ICD-10-CM | POA: Diagnosis not present

## 2022-11-17 DIAGNOSIS — G8929 Other chronic pain: Secondary | ICD-10-CM | POA: Diagnosis not present

## 2022-11-17 DIAGNOSIS — Z9181 History of falling: Secondary | ICD-10-CM | POA: Diagnosis not present

## 2022-11-17 DIAGNOSIS — L89324 Pressure ulcer of left buttock, stage 4: Secondary | ICD-10-CM | POA: Diagnosis not present

## 2022-11-19 DIAGNOSIS — L89214 Pressure ulcer of right hip, stage 4: Secondary | ICD-10-CM | POA: Diagnosis not present

## 2022-11-19 DIAGNOSIS — L89314 Pressure ulcer of right buttock, stage 4: Secondary | ICD-10-CM | POA: Diagnosis not present

## 2022-11-19 DIAGNOSIS — L89224 Pressure ulcer of left hip, stage 4: Secondary | ICD-10-CM | POA: Diagnosis not present

## 2022-11-19 DIAGNOSIS — Z09 Encounter for follow-up examination after completed treatment for conditions other than malignant neoplasm: Secondary | ICD-10-CM | POA: Diagnosis not present

## 2022-11-19 DIAGNOSIS — S71002D Unspecified open wound, left hip, subsequent encounter: Secondary | ICD-10-CM | POA: Diagnosis not present

## 2022-11-19 DIAGNOSIS — E46 Unspecified protein-calorie malnutrition: Secondary | ICD-10-CM | POA: Diagnosis not present

## 2022-11-19 DIAGNOSIS — D649 Anemia, unspecified: Secondary | ICD-10-CM | POA: Diagnosis not present

## 2022-11-19 DIAGNOSIS — S71001D Unspecified open wound, right hip, subsequent encounter: Secondary | ICD-10-CM | POA: Diagnosis not present

## 2022-11-19 DIAGNOSIS — L89324 Pressure ulcer of left buttock, stage 4: Secondary | ICD-10-CM | POA: Diagnosis not present

## 2022-11-19 DIAGNOSIS — Z9889 Other specified postprocedural states: Secondary | ICD-10-CM | POA: Diagnosis not present

## 2022-11-19 DIAGNOSIS — Z79899 Other long term (current) drug therapy: Secondary | ICD-10-CM | POA: Diagnosis not present

## 2022-11-19 DIAGNOSIS — F17210 Nicotine dependence, cigarettes, uncomplicated: Secondary | ICD-10-CM | POA: Diagnosis not present

## 2022-11-19 DIAGNOSIS — N319 Neuromuscular dysfunction of bladder, unspecified: Secondary | ICD-10-CM | POA: Diagnosis not present

## 2022-11-23 DIAGNOSIS — L89324 Pressure ulcer of left buttock, stage 4: Secondary | ICD-10-CM | POA: Diagnosis not present

## 2022-11-23 DIAGNOSIS — L89314 Pressure ulcer of right buttock, stage 4: Secondary | ICD-10-CM | POA: Diagnosis not present

## 2022-11-24 DIAGNOSIS — G8220 Paraplegia, unspecified: Secondary | ICD-10-CM | POA: Diagnosis not present

## 2022-11-24 DIAGNOSIS — G8929 Other chronic pain: Secondary | ICD-10-CM | POA: Diagnosis not present

## 2022-11-24 DIAGNOSIS — L89324 Pressure ulcer of left buttock, stage 4: Secondary | ICD-10-CM | POA: Diagnosis not present

## 2022-11-24 DIAGNOSIS — Z466 Encounter for fitting and adjustment of urinary device: Secondary | ICD-10-CM | POA: Diagnosis not present

## 2022-11-24 DIAGNOSIS — F17210 Nicotine dependence, cigarettes, uncomplicated: Secondary | ICD-10-CM | POA: Diagnosis not present

## 2022-11-24 DIAGNOSIS — Z9181 History of falling: Secondary | ICD-10-CM | POA: Diagnosis not present

## 2022-11-24 DIAGNOSIS — D649 Anemia, unspecified: Secondary | ICD-10-CM | POA: Diagnosis not present

## 2022-11-24 DIAGNOSIS — L89314 Pressure ulcer of right buttock, stage 4: Secondary | ICD-10-CM | POA: Diagnosis not present

## 2022-11-27 DIAGNOSIS — G8220 Paraplegia, unspecified: Secondary | ICD-10-CM | POA: Diagnosis not present

## 2022-11-27 DIAGNOSIS — Z9181 History of falling: Secondary | ICD-10-CM | POA: Diagnosis not present

## 2022-11-27 DIAGNOSIS — L89324 Pressure ulcer of left buttock, stage 4: Secondary | ICD-10-CM | POA: Diagnosis not present

## 2022-11-27 DIAGNOSIS — F17210 Nicotine dependence, cigarettes, uncomplicated: Secondary | ICD-10-CM | POA: Diagnosis not present

## 2022-11-27 DIAGNOSIS — G8929 Other chronic pain: Secondary | ICD-10-CM | POA: Diagnosis not present

## 2022-11-27 DIAGNOSIS — L89314 Pressure ulcer of right buttock, stage 4: Secondary | ICD-10-CM | POA: Diagnosis not present

## 2022-11-27 DIAGNOSIS — Z466 Encounter for fitting and adjustment of urinary device: Secondary | ICD-10-CM | POA: Diagnosis not present

## 2022-11-27 DIAGNOSIS — D649 Anemia, unspecified: Secondary | ICD-10-CM | POA: Diagnosis not present

## 2022-11-30 DIAGNOSIS — L89314 Pressure ulcer of right buttock, stage 4: Secondary | ICD-10-CM | POA: Diagnosis not present

## 2022-11-30 DIAGNOSIS — L89324 Pressure ulcer of left buttock, stage 4: Secondary | ICD-10-CM | POA: Diagnosis not present

## 2022-11-30 DIAGNOSIS — N319 Neuromuscular dysfunction of bladder, unspecified: Secondary | ICD-10-CM | POA: Diagnosis not present

## 2022-11-30 DIAGNOSIS — Z466 Encounter for fitting and adjustment of urinary device: Secondary | ICD-10-CM | POA: Diagnosis not present

## 2022-12-06 DIAGNOSIS — L89314 Pressure ulcer of right buttock, stage 4: Secondary | ICD-10-CM | POA: Diagnosis not present

## 2022-12-06 DIAGNOSIS — L89324 Pressure ulcer of left buttock, stage 4: Secondary | ICD-10-CM | POA: Diagnosis not present

## 2022-12-06 DIAGNOSIS — G8220 Paraplegia, unspecified: Secondary | ICD-10-CM | POA: Diagnosis not present

## 2022-12-11 ENCOUNTER — Ambulatory Visit: Payer: 59 | Admitting: Podiatry

## 2022-12-14 DIAGNOSIS — L89324 Pressure ulcer of left buttock, stage 4: Secondary | ICD-10-CM | POA: Diagnosis not present

## 2022-12-14 DIAGNOSIS — N319 Neuromuscular dysfunction of bladder, unspecified: Secondary | ICD-10-CM | POA: Diagnosis not present

## 2022-12-17 DIAGNOSIS — N3289 Other specified disorders of bladder: Secondary | ICD-10-CM | POA: Diagnosis not present

## 2022-12-17 DIAGNOSIS — E46 Unspecified protein-calorie malnutrition: Secondary | ICD-10-CM | POA: Diagnosis not present

## 2022-12-17 DIAGNOSIS — L89224 Pressure ulcer of left hip, stage 4: Secondary | ICD-10-CM | POA: Diagnosis not present

## 2022-12-17 DIAGNOSIS — D649 Anemia, unspecified: Secondary | ICD-10-CM | POA: Diagnosis not present

## 2022-12-17 DIAGNOSIS — F17210 Nicotine dependence, cigarettes, uncomplicated: Secondary | ICD-10-CM | POA: Diagnosis not present

## 2022-12-17 DIAGNOSIS — Z79899 Other long term (current) drug therapy: Secondary | ICD-10-CM | POA: Diagnosis not present

## 2022-12-17 DIAGNOSIS — L89322 Pressure ulcer of left buttock, stage 2: Secondary | ICD-10-CM | POA: Diagnosis not present

## 2022-12-17 DIAGNOSIS — Z872 Personal history of diseases of the skin and subcutaneous tissue: Secondary | ICD-10-CM | POA: Diagnosis not present

## 2022-12-17 DIAGNOSIS — L89214 Pressure ulcer of right hip, stage 4: Secondary | ICD-10-CM | POA: Diagnosis not present

## 2022-12-17 DIAGNOSIS — Z9889 Other specified postprocedural states: Secondary | ICD-10-CM | POA: Diagnosis not present

## 2022-12-21 DIAGNOSIS — F17210 Nicotine dependence, cigarettes, uncomplicated: Secondary | ICD-10-CM | POA: Diagnosis not present

## 2022-12-21 DIAGNOSIS — G8220 Paraplegia, unspecified: Secondary | ICD-10-CM | POA: Diagnosis not present

## 2022-12-21 DIAGNOSIS — G8929 Other chronic pain: Secondary | ICD-10-CM | POA: Diagnosis not present

## 2022-12-21 DIAGNOSIS — L89314 Pressure ulcer of right buttock, stage 4: Secondary | ICD-10-CM | POA: Diagnosis not present

## 2022-12-21 DIAGNOSIS — Z466 Encounter for fitting and adjustment of urinary device: Secondary | ICD-10-CM | POA: Diagnosis not present

## 2022-12-21 DIAGNOSIS — D649 Anemia, unspecified: Secondary | ICD-10-CM | POA: Diagnosis not present

## 2022-12-21 DIAGNOSIS — L89324 Pressure ulcer of left buttock, stage 4: Secondary | ICD-10-CM | POA: Diagnosis not present

## 2022-12-21 DIAGNOSIS — Z9181 History of falling: Secondary | ICD-10-CM | POA: Diagnosis not present

## 2022-12-22 ENCOUNTER — Ambulatory Visit: Payer: 59 | Admitting: Podiatry

## 2022-12-24 DIAGNOSIS — D649 Anemia, unspecified: Secondary | ICD-10-CM | POA: Diagnosis not present

## 2022-12-24 DIAGNOSIS — Z466 Encounter for fitting and adjustment of urinary device: Secondary | ICD-10-CM | POA: Diagnosis not present

## 2022-12-24 DIAGNOSIS — L89324 Pressure ulcer of left buttock, stage 4: Secondary | ICD-10-CM | POA: Diagnosis not present

## 2022-12-24 DIAGNOSIS — G8220 Paraplegia, unspecified: Secondary | ICD-10-CM | POA: Diagnosis not present

## 2022-12-24 DIAGNOSIS — F17210 Nicotine dependence, cigarettes, uncomplicated: Secondary | ICD-10-CM | POA: Diagnosis not present

## 2022-12-24 DIAGNOSIS — G8929 Other chronic pain: Secondary | ICD-10-CM | POA: Diagnosis not present

## 2022-12-24 DIAGNOSIS — Z9181 History of falling: Secondary | ICD-10-CM | POA: Diagnosis not present

## 2022-12-24 DIAGNOSIS — L89314 Pressure ulcer of right buttock, stage 4: Secondary | ICD-10-CM | POA: Diagnosis not present

## 2022-12-28 ENCOUNTER — Other Ambulatory Visit: Payer: Self-pay | Admitting: Family

## 2022-12-28 DIAGNOSIS — G6289 Other specified polyneuropathies: Secondary | ICD-10-CM

## 2022-12-29 ENCOUNTER — Ambulatory Visit: Payer: 59 | Admitting: Podiatry

## 2022-12-29 DIAGNOSIS — Z466 Encounter for fitting and adjustment of urinary device: Secondary | ICD-10-CM | POA: Diagnosis not present

## 2022-12-29 DIAGNOSIS — L89314 Pressure ulcer of right buttock, stage 4: Secondary | ICD-10-CM | POA: Diagnosis not present

## 2022-12-29 DIAGNOSIS — L89324 Pressure ulcer of left buttock, stage 4: Secondary | ICD-10-CM | POA: Diagnosis not present

## 2022-12-29 DIAGNOSIS — G8929 Other chronic pain: Secondary | ICD-10-CM | POA: Diagnosis not present

## 2022-12-29 DIAGNOSIS — G8220 Paraplegia, unspecified: Secondary | ICD-10-CM | POA: Diagnosis not present

## 2022-12-29 DIAGNOSIS — Z9181 History of falling: Secondary | ICD-10-CM | POA: Diagnosis not present

## 2022-12-29 DIAGNOSIS — F17210 Nicotine dependence, cigarettes, uncomplicated: Secondary | ICD-10-CM | POA: Diagnosis not present

## 2022-12-29 DIAGNOSIS — D649 Anemia, unspecified: Secondary | ICD-10-CM | POA: Diagnosis not present

## 2022-12-31 DIAGNOSIS — F17210 Nicotine dependence, cigarettes, uncomplicated: Secondary | ICD-10-CM | POA: Diagnosis not present

## 2022-12-31 DIAGNOSIS — L89314 Pressure ulcer of right buttock, stage 4: Secondary | ICD-10-CM | POA: Diagnosis not present

## 2022-12-31 DIAGNOSIS — G8929 Other chronic pain: Secondary | ICD-10-CM | POA: Diagnosis not present

## 2022-12-31 DIAGNOSIS — D649 Anemia, unspecified: Secondary | ICD-10-CM | POA: Diagnosis not present

## 2022-12-31 DIAGNOSIS — L89324 Pressure ulcer of left buttock, stage 4: Secondary | ICD-10-CM | POA: Diagnosis not present

## 2022-12-31 DIAGNOSIS — G8220 Paraplegia, unspecified: Secondary | ICD-10-CM | POA: Diagnosis not present

## 2022-12-31 DIAGNOSIS — Z9181 History of falling: Secondary | ICD-10-CM | POA: Diagnosis not present

## 2022-12-31 DIAGNOSIS — Z466 Encounter for fitting and adjustment of urinary device: Secondary | ICD-10-CM | POA: Diagnosis not present

## 2023-01-01 ENCOUNTER — Telehealth (INDEPENDENT_AMBULATORY_CARE_PROVIDER_SITE_OTHER): Payer: 59 | Admitting: Family Medicine

## 2023-01-01 DIAGNOSIS — J988 Other specified respiratory disorders: Secondary | ICD-10-CM | POA: Diagnosis not present

## 2023-01-01 MED ORDER — LEVOFLOXACIN 500 MG PO TABS
500.0000 mg | ORAL_TABLET | Freq: Every day | ORAL | 0 refills | Status: AC
Start: 2023-01-01 — End: 2023-01-08

## 2023-01-01 NOTE — Progress Notes (Unsigned)
   Virtual Visit via video Note   Due to COVID-19 pandemic this visit was conducted virtually. This visit type was conducted due to national recommendations for restrictions regarding the COVID-19 Pandemic (e.g. social distancing, sheltering in place) in an effort to limit this patient's exposure and mitigate transmission in our community. All issues noted in this document were discussed and addressed.  A physical exam was not performed with this format.  I connected with  Kim [name]  on 01/01/23 at 1205 by video and verified that I am speaking with the correct person using two identifiers. Kim [name] is currently located at home and no one is currently with her during the visit. The provider, Gabriel Earing, FNP is located in their office at time of visit.  I discussed the limitations, risks, security and privacy concerns of performing an evaluation and management service by video  and the availability of in person appointments. I also discussed with the patient that there may be a patient responsible charge related to this service. The patient expressed understanding and agreed to proceed.  CC: cough  History and Present Illness:  Kim [name] reports a cough for 1 week, worsening. She reports clear sputum. She also reports some mild nasal congestion. She denies shortness of breath, wheezing, fever, chills, nausea, vomiting, diarrhea, or sore throat. She reports that her home health nurse is concerned about her lung sounds. She has been taking cold and flu medication with mild relief. She denies hx of asthma, COPD, or PNA. Currently on bactrim for wound infections.    ROS As per HPI.     Observations/Objective: Alert and oriented. Respirations unlabored. No cyanosis. Non toxic appearing. Normal mood and behavior.    Assessment and Plan: [name] was seen today for cough.  Diagnoses and all orders for this visit:  Respiratory infection Unable to have patient come in for  xray today as our office is closing early. Will treat empirically with levaquin as she is also currently on bactrim for wounds. Discussed symptomatic care and return precautions.  -     levofloxacin (LEVAQUIN) 500 MG tablet; Take 1 tablet (500 mg total) by mouth daily for 7 days.     Follow Up Instructions: As needed.     I discussed the assessment and treatment plan with the patient. The patient was provided an opportunity to ask questions and all were answered. The patient agreed with the plan and demonstrated an understanding of the instructions.   The patient was advised to call back or seek an in-person evaluation if the symptoms worsen or if the condition fails to improve as anticipated.  The above assessment and management plan was discussed with the patient. The patient verbalized understanding of and has agreed to the management plan. Patient is aware to call the clinic if symptoms persist or worsen. Patient is aware when to return to the clinic for a follow-up visit. Patient educated on when it is appropriate to go to the emergency department.   Time call ended: 1213  I provided 8 minutes of face-to-face time during this encounter.    Gabriel Earing, FNP

## 2023-01-04 ENCOUNTER — Encounter: Payer: Self-pay | Admitting: Family Medicine

## 2023-01-04 DIAGNOSIS — R339 Retention of urine, unspecified: Secondary | ICD-10-CM | POA: Diagnosis not present

## 2023-01-05 DIAGNOSIS — L89324 Pressure ulcer of left buttock, stage 4: Secondary | ICD-10-CM | POA: Diagnosis not present

## 2023-01-05 DIAGNOSIS — G8220 Paraplegia, unspecified: Secondary | ICD-10-CM | POA: Diagnosis not present

## 2023-01-05 DIAGNOSIS — L89314 Pressure ulcer of right buttock, stage 4: Secondary | ICD-10-CM | POA: Diagnosis not present

## 2023-01-07 DIAGNOSIS — L89214 Pressure ulcer of right hip, stage 4: Secondary | ICD-10-CM | POA: Diagnosis not present

## 2023-01-07 DIAGNOSIS — L89323 Pressure ulcer of left buttock, stage 3: Secondary | ICD-10-CM | POA: Diagnosis not present

## 2023-01-07 DIAGNOSIS — Z9889 Other specified postprocedural states: Secondary | ICD-10-CM | POA: Diagnosis not present

## 2023-01-07 DIAGNOSIS — E46 Unspecified protein-calorie malnutrition: Secondary | ICD-10-CM | POA: Diagnosis not present

## 2023-01-07 DIAGNOSIS — S31819D Unspecified open wound of right buttock, subsequent encounter: Secondary | ICD-10-CM | POA: Diagnosis not present

## 2023-01-07 DIAGNOSIS — F17210 Nicotine dependence, cigarettes, uncomplicated: Secondary | ICD-10-CM | POA: Diagnosis not present

## 2023-01-07 DIAGNOSIS — D649 Anemia, unspecified: Secondary | ICD-10-CM | POA: Diagnosis not present

## 2023-01-12 DIAGNOSIS — Z466 Encounter for fitting and adjustment of urinary device: Secondary | ICD-10-CM | POA: Diagnosis not present

## 2023-01-12 DIAGNOSIS — Z9181 History of falling: Secondary | ICD-10-CM | POA: Diagnosis not present

## 2023-01-12 DIAGNOSIS — F17210 Nicotine dependence, cigarettes, uncomplicated: Secondary | ICD-10-CM | POA: Diagnosis not present

## 2023-01-12 DIAGNOSIS — G8929 Other chronic pain: Secondary | ICD-10-CM | POA: Diagnosis not present

## 2023-01-12 DIAGNOSIS — D649 Anemia, unspecified: Secondary | ICD-10-CM | POA: Diagnosis not present

## 2023-01-12 DIAGNOSIS — L89324 Pressure ulcer of left buttock, stage 4: Secondary | ICD-10-CM | POA: Diagnosis not present

## 2023-01-12 DIAGNOSIS — L89314 Pressure ulcer of right buttock, stage 4: Secondary | ICD-10-CM | POA: Diagnosis not present

## 2023-01-12 DIAGNOSIS — G8220 Paraplegia, unspecified: Secondary | ICD-10-CM | POA: Diagnosis not present

## 2023-01-18 DIAGNOSIS — L89314 Pressure ulcer of right buttock, stage 4: Secondary | ICD-10-CM | POA: Diagnosis not present

## 2023-01-18 DIAGNOSIS — G8220 Paraplegia, unspecified: Secondary | ICD-10-CM | POA: Diagnosis not present

## 2023-01-18 DIAGNOSIS — Z466 Encounter for fitting and adjustment of urinary device: Secondary | ICD-10-CM | POA: Diagnosis not present

## 2023-01-18 DIAGNOSIS — F17210 Nicotine dependence, cigarettes, uncomplicated: Secondary | ICD-10-CM | POA: Diagnosis not present

## 2023-01-18 DIAGNOSIS — L89324 Pressure ulcer of left buttock, stage 4: Secondary | ICD-10-CM | POA: Diagnosis not present

## 2023-01-18 DIAGNOSIS — G8929 Other chronic pain: Secondary | ICD-10-CM | POA: Diagnosis not present

## 2023-01-18 DIAGNOSIS — Z9181 History of falling: Secondary | ICD-10-CM | POA: Diagnosis not present

## 2023-01-18 DIAGNOSIS — D649 Anemia, unspecified: Secondary | ICD-10-CM | POA: Diagnosis not present

## 2023-01-21 DIAGNOSIS — L89309 Pressure ulcer of unspecified buttock, unspecified stage: Secondary | ICD-10-CM | POA: Diagnosis not present

## 2023-01-21 DIAGNOSIS — F17200 Nicotine dependence, unspecified, uncomplicated: Secondary | ICD-10-CM | POA: Diagnosis not present

## 2023-01-21 DIAGNOSIS — Z4789 Encounter for other orthopedic aftercare: Secondary | ICD-10-CM | POA: Diagnosis not present

## 2023-01-21 DIAGNOSIS — Z466 Encounter for fitting and adjustment of urinary device: Secondary | ICD-10-CM | POA: Diagnosis not present

## 2023-01-21 DIAGNOSIS — N319 Neuromuscular dysfunction of bladder, unspecified: Secondary | ICD-10-CM | POA: Diagnosis not present

## 2023-01-21 DIAGNOSIS — L89314 Pressure ulcer of right buttock, stage 4: Secondary | ICD-10-CM | POA: Diagnosis not present

## 2023-01-21 DIAGNOSIS — G8220 Paraplegia, unspecified: Secondary | ICD-10-CM | POA: Diagnosis not present

## 2023-01-21 DIAGNOSIS — M85851 Other specified disorders of bone density and structure, right thigh: Secondary | ICD-10-CM | POA: Diagnosis not present

## 2023-01-21 DIAGNOSIS — Z9889 Other specified postprocedural states: Secondary | ICD-10-CM | POA: Diagnosis not present

## 2023-01-21 DIAGNOSIS — Z79899 Other long term (current) drug therapy: Secondary | ICD-10-CM | POA: Diagnosis not present

## 2023-01-21 DIAGNOSIS — L89899 Pressure ulcer of other site, unspecified stage: Secondary | ICD-10-CM | POA: Diagnosis not present

## 2023-01-21 DIAGNOSIS — L89609 Pressure ulcer of unspecified heel, unspecified stage: Secondary | ICD-10-CM | POA: Diagnosis not present

## 2023-01-21 DIAGNOSIS — M1612 Unilateral primary osteoarthritis, left hip: Secondary | ICD-10-CM | POA: Diagnosis not present

## 2023-01-23 DIAGNOSIS — L89324 Pressure ulcer of left buttock, stage 4: Secondary | ICD-10-CM | POA: Diagnosis not present

## 2023-01-23 DIAGNOSIS — L89314 Pressure ulcer of right buttock, stage 4: Secondary | ICD-10-CM | POA: Diagnosis not present

## 2023-01-25 DIAGNOSIS — Z466 Encounter for fitting and adjustment of urinary device: Secondary | ICD-10-CM | POA: Diagnosis not present

## 2023-01-25 DIAGNOSIS — L89324 Pressure ulcer of left buttock, stage 4: Secondary | ICD-10-CM | POA: Diagnosis not present

## 2023-01-25 DIAGNOSIS — Z9181 History of falling: Secondary | ICD-10-CM | POA: Diagnosis not present

## 2023-01-25 DIAGNOSIS — F17210 Nicotine dependence, cigarettes, uncomplicated: Secondary | ICD-10-CM | POA: Diagnosis not present

## 2023-01-25 DIAGNOSIS — L89314 Pressure ulcer of right buttock, stage 4: Secondary | ICD-10-CM | POA: Diagnosis not present

## 2023-01-25 DIAGNOSIS — G8929 Other chronic pain: Secondary | ICD-10-CM | POA: Diagnosis not present

## 2023-01-25 DIAGNOSIS — G8220 Paraplegia, unspecified: Secondary | ICD-10-CM | POA: Diagnosis not present

## 2023-01-25 DIAGNOSIS — D649 Anemia, unspecified: Secondary | ICD-10-CM | POA: Diagnosis not present

## 2023-01-26 ENCOUNTER — Other Ambulatory Visit: Payer: Self-pay | Admitting: *Deleted

## 2023-01-26 DIAGNOSIS — G6289 Other specified polyneuropathies: Secondary | ICD-10-CM

## 2023-01-26 NOTE — Telephone Encounter (Signed)
[facility] NTBS 30 days given 12/28/22

## 2023-01-27 ENCOUNTER — Encounter: Payer: Self-pay | Admitting: Family

## 2023-01-27 NOTE — Telephone Encounter (Signed)
NA. Letter mailed 

## 2023-02-03 ENCOUNTER — Telehealth: Payer: Self-pay | Admitting: Family

## 2023-02-03 DIAGNOSIS — E43 Unspecified severe protein-calorie malnutrition: Secondary | ICD-10-CM

## 2023-02-03 DIAGNOSIS — L8944 Pressure ulcer of contiguous site of back, buttock and hip, stage 4: Secondary | ICD-10-CM

## 2023-02-03 DIAGNOSIS — S343XXS Injury of cauda equina, sequela: Secondary | ICD-10-CM

## 2023-02-03 NOTE — Telephone Encounter (Signed)
[name] called stating that patient is her client and wants to know if PCP could write order for Serious Illness Care [facility]). Says pt has a wound Vac and needs a nurse to come to the home PRN. Says Home Health is not providing support for this. Please advise.   Parnell, [phone]

## 2023-02-04 NOTE — Telephone Encounter (Signed)
Palliative Care referral placed

## 2023-02-05 ENCOUNTER — Telehealth: Payer: 59 | Admitting: Family

## 2023-02-05 ENCOUNTER — Encounter: Payer: Self-pay | Admitting: Family

## 2023-02-05 DIAGNOSIS — F411 Generalized anxiety disorder: Secondary | ICD-10-CM

## 2023-02-05 DIAGNOSIS — E440 Moderate protein-calorie malnutrition: Secondary | ICD-10-CM

## 2023-02-05 DIAGNOSIS — G6289 Other specified polyneuropathies: Secondary | ICD-10-CM

## 2023-02-05 DIAGNOSIS — G8220 Paraplegia, unspecified: Secondary | ICD-10-CM | POA: Diagnosis not present

## 2023-02-05 DIAGNOSIS — L89324 Pressure ulcer of left buttock, stage 4: Secondary | ICD-10-CM | POA: Diagnosis not present

## 2023-02-05 DIAGNOSIS — L8944 Pressure ulcer of contiguous site of back, buttock and hip, stage 4: Secondary | ICD-10-CM | POA: Diagnosis not present

## 2023-02-05 DIAGNOSIS — S343XXS Injury of cauda equina, sequela: Secondary | ICD-10-CM

## 2023-02-05 DIAGNOSIS — N319 Neuromuscular dysfunction of bladder, unspecified: Secondary | ICD-10-CM | POA: Diagnosis not present

## 2023-02-05 DIAGNOSIS — L89624 Pressure ulcer of left heel, stage 4: Secondary | ICD-10-CM | POA: Diagnosis not present

## 2023-02-05 DIAGNOSIS — L89314 Pressure ulcer of right buttock, stage 4: Secondary | ICD-10-CM | POA: Diagnosis not present

## 2023-02-05 DIAGNOSIS — I959 Hypotension, unspecified: Secondary | ICD-10-CM

## 2023-02-05 MED ORDER — GABAPENTIN 300 MG PO CAPS
ORAL_CAPSULE | ORAL | 2 refills | Status: DC
Start: 2023-02-05 — End: 2023-04-26

## 2023-02-05 NOTE — Progress Notes (Signed)
Virtual Visit Consent   Kim [name], you are scheduled for a virtual visit with a [facility] provider today. Just as with appointments in the office, your consent must be obtained to participate. Your consent will be active for this visit and any virtual visit you may have with one of our providers in the next 365 days. If you have a MyChart account, a copy of this consent can be sent to you electronically.  As this is a virtual visit, video technology does not allow for your provider to perform a traditional examination. This may limit your provider's ability to fully assess your condition. If your provider identifies any concerns that need to be evaluated in person or the need to arrange testing (such as labs, EKG, etc.), we will make arrangements to do so. Although advances in technology are sophisticated, we cannot ensure that it will always work on either your end or our end. If the connection with a video visit is poor, the visit may have to be switched to a telephone visit. With either a video or telephone visit, we are not always able to ensure that we have a secure connection.  By engaging in this virtual visit, you consent to the provision of healthcare and authorize for your insurance to be billed (if applicable) for the services provided during this visit. Depending on your insurance coverage, you may receive a charge related to this service.  I need to obtain your verbal consent now. Are you willing to proceed with your visit today? Kim [name] has provided verbal consent on 02/05/2023 for a virtual visit (video or telephone). [name], FNP  Date: 02/05/2023 2:00 PM  Virtual Visit via Video Note   I, [name], connected with  Kim [name]  ([id number], May 18, 1975) on 02/05/23 at  1:55 PM EDT by a video-enabled telemedicine application and verified that I am speaking with the correct person using two identifiers.  Location: Patient: Virtual Visit Location  Patient: Home Provider: Virtual Visit Location Provider: Home Office   I discussed the limitations of evaluation and management by telemedicine and the availability of in person appointments. The patient expressed understanding and agreed to proceed.    History of Present Illness: Kim [name] is a 47 y.o. who identifies as a female who was assigned female at birth, and is being seen today for chronic follow up. She is followed ortho surgeon and hx of failed right hip replacement. This was removed 09/2021.   She has cauda equina spinal cord injury and multiple pressure ulcers. She had a wound vac right buttocks and left buttock and is followed by wound care. She was having home health nurse coming M,W, F, but having trouble finding an agency to come change her wound vac. She currently does not have a wound vac and doing wet to dry dressing.   She has an aid 11 hours a day to help with daily activities since she is bed bound. She is suppose to be out of bed only a hour a day.   She is followed by ID and currently bactrim BID.    She is also followed by suboxone clinic.     She is followed by Urologists every 6 months for neurogenic bladder. She has bladder spasms and has had a catheter since 03/2021.  Home health changes this out once a month.    She has hypotension and takes midodrine.    She takes gabapentin 600 mg BID for polyneuropathy. Marland Kitchen  HPI: HPI  Problems:  Patient Active Problem List   Diagnosis Date Noted   Hypotension 05/19/2022   Peripheral neuropathy 05/19/2022   Osteomyelitis of right femur (HCC) 07/09/2021   Malnutrition of moderate degree 04/16/2021   Chronic osteomyelitis of left foot with draining sinus (HCC)    Severe protein-calorie malnutrition (HCC) 04/08/2021   Sepsis with acute organ dysfunction (HCC) 04/04/2021   Pressure injury of left hip, stage 3 (HCC) 03/04/2021   Pressure injury of contiguous region involving right buttock and hip, stage 4 (HCC)  02/10/2021   Pressure injury of left heel, stage 4 (HCC) 02/10/2021   Pressure injury of skin of right hip 01/28/2021   Non-healing open wound of heel, initial encounter 04/12/2020   At risk for falls 02/09/2018   Post-menopausal osteoporosis 02/09/2018   Loosening of prosthetic hip (HCC) 06/01/2016   Encounter for routine gynecological examination with Papanicolaou smear of cervix 03/03/2016   Enlarged uterus 03/03/2016   Postmenopausal 03/03/2016   GAD (generalized anxiety disorder) 01/21/2016   Dislocation of right hip (HCC) 08/01/2015   Status post right hip replacement 07/24/2015   Neurogenic bladder 06/28/2015   Current smoker 05/13/2015   Height loss 05/13/2015   Wheelchair dependent 05/13/2015   History of developmental dysplasia of the hip 04/29/2015   Primary osteoarthritis of knees, bilateral 04/29/2015   Osteopenia    Vitamin D deficiency 05/01/2014   Heterotopic ossification of bone 08/02/2013   Pain of right hip joint 08/02/2013   Cauda equina injury without spinal bone injury (HCC) 05/26/2011   Low back pain 05/26/2011   Osteoarthritis of knee 03/23/2011   Osteoarthritis of thoracolumbar spine 03/23/2011   Left knee pain 02/08/2011   KNEE PAIN 05/23/2007   SPONDYLOSIS WITH MYELOPATHY LUMBAR REGION 05/23/2007    Allergies:  Allergies  Allergen Reactions   Keflex [Cephalexin] Diarrhea   Medications:  Current Outpatient Medications:    acetaminophen (TYLENOL) 650 MG suppository, Place 650 mg rectally every 4 (four) hours as needed., Disp: , Rfl:    Ascorbic Acid (VITAMIN C) 500 MG CAPS, Take 500 mg by mouth daily., Disp: 90 capsule, Rfl: 11   baclofen (LIORESAL) 10 MG tablet, Take 1 tablet (10 mg total) by mouth 3 (three) times daily., Disp: 60 each, Rfl: 3   Buprenorphine HCl-Naloxone HCl 8-2 MG FILM, Place 1 Film under the tongue 3 (three) times daily., Disp: , Rfl:    buPROPion (WELLBUTRIN SR) 200 MG 12 hr tablet, Take 1 tablet (200 mg total) by mouth 2 (two)  times daily., Disp: 180 tablet, Rfl: 1   CALCIUM-VITAMIN D PO, Take 1 tablet by mouth daily., Disp: , Rfl:    clonazePAM (KLONOPIN) 0.5 MG tablet, Take 0.5 mg by mouth 2 (two) times daily., Disp: , Rfl:    clotrimazole-betamethasone (LOTRISONE) cream, Apply 1 Application topically daily., Disp: 30 g, Rfl: 2   collagenase (SANTYL) ointment, Apply topically daily., Disp: 30 g, Rfl: 0   Elastic Bandages & Supports (HEEL/ANKLE PROTECTOR) MISC, 1 each by Does not apply route at bedtime., Disp: 2 each, Rfl: 1   ferrous sulfate 325 (65 FE) MG tablet, Take 1 tablet (325 mg total) by mouth 2 (two) times daily with a meal., Disp: 60 tablet, Rfl: 5   folic acid (FOLVITE) 1 MG tablet, Take 1 tablet (1 mg total) by mouth daily., Disp: 30 tablet, Rfl: 5   Foot Care Products (SOCK AID) MISC, 1 Device by Does not apply route daily., Disp: 1 each, Rfl: 0   gabapentin (  NEURONTIN) 300 MG capsule, TAKE 2 CAPSULES BY MOUTH 2 TIMES A DAY, Disp: 120 capsule, Rfl: 2   midodrine (PROAMATINE) 10 MG tablet, Take 1 tablet (10 mg total) by mouth 3 (three) times daily with meals., Disp: 90 tablet, Rfl: 2   mirabegron ER (MYRBETRIQ) 25 MG TB24 tablet, 25 mg., Disp: , Rfl:    Misc. Devices (BED WEDGE) MISC, 1 each by Does not apply route 2 (two) times daily., Disp: 2 each, Rfl: 0   Misc. Devices (TRANSFER BENCH) MISC, 1 Device by Does not apply route as needed., Disp: 1 each, Rfl: 1   Misc. Devices [facility] CUSHION) MISC, 1 application by Does not apply route daily., Disp: 1 each, Rfl: 1   Multiple Vitamins-Calcium (ONE-A-DAY WOMENS PO), Take 2 tablets by mouth daily., Disp: , Rfl:    NARCAN 4 MG/0.1ML LIQD nasal spray kit, , Disp: , Rfl:    Nutritional Supplements (ENSURE HIGH PROTEIN) LIQD, Take 237 mLs by mouth 4 (four) times daily - after meals and at bedtime., Disp: 3792 mL, Rfl: 11   Nutritional Supplements (JUVEN NUTRIVIGOR) PACK, Take 1 each by mouth 3 (three) times daily., Disp: 180 each, Rfl: 3   Nutritional  Supplements (PROMOD) LIQD, Take 30 mLs by mouth 2 (two) times daily., Disp: 946 mL, Rfl: 11   nystatin (MYCOSTATIN/NYSTOP) powder, Apply 1 Application topically 3 (three) times daily., Disp: 60 g, Rfl: 2   Plecanatide (TRULANCE) 3 MG TABS, Take 3 mg by mouth daily., Disp: 90 tablet, Rfl: 1   polyethylene glycol powder (GLYCOLAX/MIRALAX) 17 GM/SCOOP powder, Take 17 g by mouth daily., Disp: 3350 g, Rfl: 1   POTASSIUM PO, Take 50 mg by mouth daily., Disp: , Rfl:    silver sulfADIAZINE (SILVADENE) 1 % cream, Apply 1 application topically daily., Disp: 50 g, Rfl: 0   sodium hypochlorite (DAKIN'S 1/2 STRENGTH) external solution, Apply to wounds as directed, Disp: , Rfl:    sulfamethoxazole-trimethoprim (BACTRIM DS) 800-160 MG tablet, Take 1 tablet by mouth 2 (two) times daily., Disp: , Rfl:    Zinc 50 MG CAPS, Take 1 capsule (50 mg total) by mouth daily., Disp: 90 capsule, Rfl: 1  Observations/Objective: Patient is well-developed, well-nourished in no acute distress.  Resting comfortably  at home.  Head is normocephalic, atraumatic.  No labored breathing.  Speech is clear and coherent with logical content.  Patient is alert and oriented at baseline.  Dressing intact on wounds  Assessment and Plan: 1. Other polyneuropathy - gabapentin (NEURONTIN) 300 MG capsule; TAKE 2 CAPSULES BY MOUTH 2 TIMES A DAY  Dispense: 120 capsule; Refill: 2  2. Cauda equina injury without spinal bone injury, sequela - Ambulatory referral to Home Health  3. GAD (generalized anxiety disorder)  4. Pressure injury of contiguous region involving right buttock and hip, stage 4 (HCC) - Ambulatory referral to Home Health  5. Pressure injury of left heel, stage 4 (HCC) - Ambulatory referral to Home Health  6. Malnutrition of moderate degree - Ambulatory referral to Home Health  7. Neurogenic bladder - Ambulatory referral to Home Health  8. Hypotension, unspecified hypotension type  Continue current medications   Keep follow up with wound care  Referral to Home health placed Follow up 3 months   Follow Up Instructions: I discussed the assessment and treatment plan with the patient. The patient was provided an opportunity to ask questions and all were answered. The patient agreed with the plan and demonstrated an understanding of the instructions.  A copy of  instructions were sent to the patient via MyChart unless otherwise noted below.    The patient was advised to call back or seek an in-person evaluation if the symptoms worsen or if the condition fails to improve as anticipated.    [name], FNP

## 2023-02-12 DIAGNOSIS — E440 Moderate protein-calorie malnutrition: Secondary | ICD-10-CM | POA: Diagnosis not present

## 2023-02-12 DIAGNOSIS — L89224 Pressure ulcer of left hip, stage 4: Secondary | ICD-10-CM | POA: Diagnosis not present

## 2023-02-12 DIAGNOSIS — F17210 Nicotine dependence, cigarettes, uncomplicated: Secondary | ICD-10-CM | POA: Diagnosis not present

## 2023-02-12 DIAGNOSIS — Z79899 Other long term (current) drug therapy: Secondary | ICD-10-CM | POA: Diagnosis not present

## 2023-02-12 DIAGNOSIS — F17200 Nicotine dependence, unspecified, uncomplicated: Secondary | ICD-10-CM | POA: Diagnosis not present

## 2023-02-12 DIAGNOSIS — L89214 Pressure ulcer of right hip, stage 4: Secondary | ICD-10-CM | POA: Diagnosis not present

## 2023-02-12 DIAGNOSIS — D649 Anemia, unspecified: Secondary | ICD-10-CM | POA: Diagnosis not present

## 2023-02-12 DIAGNOSIS — L89324 Pressure ulcer of left buttock, stage 4: Secondary | ICD-10-CM | POA: Diagnosis not present

## 2023-02-12 DIAGNOSIS — Z9889 Other specified postprocedural states: Secondary | ICD-10-CM | POA: Diagnosis not present

## 2023-02-16 ENCOUNTER — Encounter: Payer: Self-pay | Admitting: Podiatry

## 2023-02-16 ENCOUNTER — Ambulatory Visit (INDEPENDENT_AMBULATORY_CARE_PROVIDER_SITE_OTHER): Payer: 59 | Admitting: Podiatry

## 2023-02-16 DIAGNOSIS — M79675 Pain in left toe(s): Secondary | ICD-10-CM

## 2023-02-16 DIAGNOSIS — M79674 Pain in right toe(s): Secondary | ICD-10-CM | POA: Diagnosis not present

## 2023-02-16 DIAGNOSIS — B351 Tinea unguium: Secondary | ICD-10-CM | POA: Diagnosis not present

## 2023-02-16 DIAGNOSIS — G6181 Chronic inflammatory demyelinating polyneuritis: Secondary | ICD-10-CM

## 2023-02-16 NOTE — Progress Notes (Signed)
This patient returns to my office for at risk foot care.  This patient requires this care by a professional since this patient will be at risk due to having neuropathy.  This patient is unable to cut nails herself since the patient cannot reach her nails.These nails are painful walking and wearing shoes.   She presents to the office in a wheelchair. This patient presents for at risk foot care today.  General Appearance  Alert, conversant and in no acute stress.  Vascular  Dorsalis pedis and posterior tibial  pulses are palpable  bilaterally.  Capillary return is within normal limits  bilaterally. Temperature is within normal limits  bilaterally. Purplish feet noted.  Neurologic  Senn-Weinstein monofilament wire test within normal limits  bilaterally. Muscle power within normal limits bilaterally.  Nails Thick disfigured discolored nails with subungual debris  from hallux to fifth toes bilaterally. No evidence of bacterial infection or drainage bilaterally.  Orthopedic  No limitations of motion  feet .  No crepitus or effusions noted.  No bony pathology or digital deformities noted.  Skin  normotropic skin with no porokeratosis noted bilaterally.  No signs of infections or ulcers noted.     Onychomycosis  Pain in right toes  Pain in left toes  Consent was obtained for treatment procedures.   Mechanical debridement of nails 1-5  bilaterally performed with a nail nipper.  Filed with dremel without incident.    Return office visit    3 months                  Told patient to return for periodic foot care and evaluation due to potential at risk complications.   Helane Gunther DPM

## 2023-02-22 DIAGNOSIS — L89314 Pressure ulcer of right buttock, stage 4: Secondary | ICD-10-CM | POA: Diagnosis not present

## 2023-02-22 DIAGNOSIS — Z79899 Other long term (current) drug therapy: Secondary | ICD-10-CM | POA: Diagnosis not present

## 2023-02-22 DIAGNOSIS — L89324 Pressure ulcer of left buttock, stage 4: Secondary | ICD-10-CM | POA: Diagnosis not present

## 2023-02-23 DIAGNOSIS — L89324 Pressure ulcer of left buttock, stage 4: Secondary | ICD-10-CM | POA: Diagnosis not present

## 2023-02-23 DIAGNOSIS — L89314 Pressure ulcer of right buttock, stage 4: Secondary | ICD-10-CM | POA: Diagnosis not present

## 2023-02-24 ENCOUNTER — Telehealth: Payer: Self-pay

## 2023-02-24 MED ORDER — CLOTRIMAZOLE-BETAMETHASONE 1-0.05 % EX CREA: 1.0000 | TOPICAL_CREAM | Freq: Every day | CUTANEOUS | 2 refills | Status: DC

## 2023-02-24 NOTE — Addendum Note (Signed)
Addended byLilian Kapur, Karlton Maya R on: 02/24/2023 03:04 PM   Modules accepted: Orders

## 2023-02-24 NOTE — Telephone Encounter (Signed)
Patient called - she sees Dr, Stacie Acres for routine foot care - you filled lotrisone for her in January - she is asking for a refill -she said it really cleared up her feet - now she is having the same skin problem. [employer]

## 2023-03-07 DIAGNOSIS — G8220 Paraplegia, unspecified: Secondary | ICD-10-CM | POA: Diagnosis not present

## 2023-03-07 DIAGNOSIS — L89314 Pressure ulcer of right buttock, stage 4: Secondary | ICD-10-CM | POA: Diagnosis not present

## 2023-03-07 DIAGNOSIS — L89324 Pressure ulcer of left buttock, stage 4: Secondary | ICD-10-CM | POA: Diagnosis not present

## 2023-03-09 DIAGNOSIS — G8220 Paraplegia, unspecified: Secondary | ICD-10-CM | POA: Diagnosis not present

## 2023-03-09 DIAGNOSIS — Z7401 Bed confinement status: Secondary | ICD-10-CM | POA: Diagnosis not present

## 2023-03-09 DIAGNOSIS — G834 Cauda equina syndrome: Secondary | ICD-10-CM | POA: Diagnosis not present

## 2023-03-09 DIAGNOSIS — L89614 Pressure ulcer of right heel, stage 4: Secondary | ICD-10-CM | POA: Diagnosis not present

## 2023-03-09 DIAGNOSIS — E440 Moderate protein-calorie malnutrition: Secondary | ICD-10-CM | POA: Diagnosis not present

## 2023-03-09 DIAGNOSIS — L89304 Pressure ulcer of unspecified buttock, stage 4: Secondary | ICD-10-CM | POA: Diagnosis not present

## 2023-03-09 DIAGNOSIS — R634 Abnormal weight loss: Secondary | ICD-10-CM | POA: Diagnosis not present

## 2023-03-09 DIAGNOSIS — Z960 Presence of urogenital implants: Secondary | ICD-10-CM | POA: Diagnosis not present

## 2023-03-16 ENCOUNTER — Other Ambulatory Visit: Payer: Self-pay | Admitting: Family

## 2023-03-16 MED ORDER — MIRTAZAPINE 7.5 MG PO TABS: 7.5000 mg | ORAL_TABLET | Freq: Every day | ORAL | 1 refills | Status: AC

## 2023-03-18 DIAGNOSIS — Z79899 Other long term (current) drug therapy: Secondary | ICD-10-CM | POA: Diagnosis not present

## 2023-03-18 DIAGNOSIS — L89224 Pressure ulcer of left hip, stage 4: Secondary | ICD-10-CM | POA: Diagnosis not present

## 2023-03-18 DIAGNOSIS — D649 Anemia, unspecified: Secondary | ICD-10-CM | POA: Diagnosis not present

## 2023-03-18 DIAGNOSIS — E46 Unspecified protein-calorie malnutrition: Secondary | ICD-10-CM | POA: Diagnosis not present

## 2023-03-18 DIAGNOSIS — L89324 Pressure ulcer of left buttock, stage 4: Secondary | ICD-10-CM | POA: Diagnosis not present

## 2023-03-18 DIAGNOSIS — L89314 Pressure ulcer of right buttock, stage 4: Secondary | ICD-10-CM | POA: Diagnosis not present

## 2023-03-18 DIAGNOSIS — F17210 Nicotine dependence, cigarettes, uncomplicated: Secondary | ICD-10-CM | POA: Diagnosis not present

## 2023-03-18 DIAGNOSIS — R634 Abnormal weight loss: Secondary | ICD-10-CM | POA: Diagnosis not present

## 2023-03-18 DIAGNOSIS — L89214 Pressure ulcer of right hip, stage 4: Secondary | ICD-10-CM | POA: Diagnosis not present

## 2023-03-25 DIAGNOSIS — L89324 Pressure ulcer of left buttock, stage 4: Secondary | ICD-10-CM | POA: Diagnosis not present

## 2023-03-25 DIAGNOSIS — L89314 Pressure ulcer of right buttock, stage 4: Secondary | ICD-10-CM | POA: Diagnosis not present

## 2023-03-25 DIAGNOSIS — R339 Retention of urine, unspecified: Secondary | ICD-10-CM | POA: Diagnosis not present

## 2023-03-31 NOTE — Progress Notes (Signed)
No show

## 2023-04-01 DIAGNOSIS — L89324 Pressure ulcer of left buttock, stage 4: Secondary | ICD-10-CM | POA: Diagnosis not present

## 2023-04-07 DIAGNOSIS — L89314 Pressure ulcer of right buttock, stage 4: Secondary | ICD-10-CM | POA: Diagnosis not present

## 2023-04-07 DIAGNOSIS — L89324 Pressure ulcer of left buttock, stage 4: Secondary | ICD-10-CM | POA: Diagnosis not present

## 2023-04-07 DIAGNOSIS — G8220 Paraplegia, unspecified: Secondary | ICD-10-CM | POA: Diagnosis not present

## 2023-04-08 DIAGNOSIS — E440 Moderate protein-calorie malnutrition: Secondary | ICD-10-CM | POA: Diagnosis not present

## 2023-04-08 DIAGNOSIS — L89214 Pressure ulcer of right hip, stage 4: Secondary | ICD-10-CM | POA: Diagnosis not present

## 2023-04-08 DIAGNOSIS — F17200 Nicotine dependence, unspecified, uncomplicated: Secondary | ICD-10-CM | POA: Diagnosis not present

## 2023-04-08 DIAGNOSIS — F17210 Nicotine dependence, cigarettes, uncomplicated: Secondary | ICD-10-CM | POA: Diagnosis not present

## 2023-04-08 DIAGNOSIS — D649 Anemia, unspecified: Secondary | ICD-10-CM | POA: Diagnosis not present

## 2023-04-08 DIAGNOSIS — L89224 Pressure ulcer of left hip, stage 4: Secondary | ICD-10-CM | POA: Diagnosis not present

## 2023-04-08 DIAGNOSIS — I96 Gangrene, not elsewhere classified: Secondary | ICD-10-CM | POA: Diagnosis not present

## 2023-04-14 DIAGNOSIS — Z7401 Bed confinement status: Secondary | ICD-10-CM | POA: Diagnosis not present

## 2023-04-14 DIAGNOSIS — Z960 Presence of urogenital implants: Secondary | ICD-10-CM | POA: Diagnosis not present

## 2023-04-14 DIAGNOSIS — R634 Abnormal weight loss: Secondary | ICD-10-CM | POA: Diagnosis not present

## 2023-04-14 DIAGNOSIS — M623 Immobility syndrome (paraplegic): Secondary | ICD-10-CM | POA: Diagnosis not present

## 2023-04-14 DIAGNOSIS — G834 Cauda equina syndrome: Secondary | ICD-10-CM | POA: Diagnosis not present

## 2023-04-14 DIAGNOSIS — E440 Moderate protein-calorie malnutrition: Secondary | ICD-10-CM | POA: Diagnosis not present

## 2023-04-14 DIAGNOSIS — G8220 Paraplegia, unspecified: Secondary | ICD-10-CM | POA: Diagnosis not present

## 2023-04-14 DIAGNOSIS — L89614 Pressure ulcer of right heel, stage 4: Secondary | ICD-10-CM | POA: Diagnosis not present

## 2023-04-20 DIAGNOSIS — N319 Neuromuscular dysfunction of bladder, unspecified: Secondary | ICD-10-CM | POA: Diagnosis not present

## 2023-04-21 DIAGNOSIS — R339 Retention of urine, unspecified: Secondary | ICD-10-CM | POA: Diagnosis not present

## 2023-04-22 ENCOUNTER — Telehealth: Payer: Self-pay | Admitting: Family Medicine

## 2023-04-22 ENCOUNTER — Telehealth: Payer: Self-pay | Admitting: Family

## 2023-04-22 NOTE — Telephone Encounter (Signed)
Copied from CRM [id number]. Topic: Clinical - Home Health Verbal Orders >> Apr 22, 2023  2:01 PM Lorretta Harp wrote: Caller/Agency: Vassie Loll Callback Number: [phone] Service Requested: new order sent for home health care from DR. Hawks Frequency: Patient needs a new order sent from Dr.Hawks so patient can get home health started. Any new concerns about the patient? Patient needs home health order because she had been unable to get home health started due to insurance, and needs wound care for a port that has not be flushed due to lack of home healthcare

## 2023-04-22 NOTE — Telephone Encounter (Signed)
TC to pt, NTBS for [facility] documentation, video visit scheduled for 04/23/23

## 2023-04-22 NOTE — Telephone Encounter (Signed)
Apt scheduled to discuss with PCP  Copied from CRM [id number]. Topic: Referral - Request for Referral >> Apr 22, 2023 12:14 PM Mosetta Putt H wrote: Reason for CRM: Patient Is requesting a referral to home health agency or any recommendations

## 2023-04-23 ENCOUNTER — Encounter: Payer: Self-pay | Admitting: Family

## 2023-04-23 ENCOUNTER — Telehealth (INDEPENDENT_AMBULATORY_CARE_PROVIDER_SITE_OTHER): Payer: 59 | Admitting: Family

## 2023-04-23 DIAGNOSIS — S343XXS Injury of cauda equina, sequela: Secondary | ICD-10-CM | POA: Diagnosis not present

## 2023-04-23 DIAGNOSIS — L89624 Pressure ulcer of left heel, stage 4: Secondary | ICD-10-CM

## 2023-04-23 DIAGNOSIS — L8944 Pressure ulcer of contiguous site of back, buttock and hip, stage 4: Secondary | ICD-10-CM

## 2023-04-23 DIAGNOSIS — N319 Neuromuscular dysfunction of bladder, unspecified: Secondary | ICD-10-CM

## 2023-04-23 NOTE — Progress Notes (Signed)
Virtual Visit Consent   Kim [name], you are scheduled for a virtual visit with a [facility] provider today. Just as with appointments in the office, your consent must be obtained to participate. Your consent will be active for this visit and any virtual visit you may have with one of our providers in the next 365 days. If you have a MyChart account, a copy of this consent can be sent to you electronically.  As this is a virtual visit, video technology does not allow for your provider to perform a traditional examination. This may limit your provider's ability to fully assess your condition. If your provider identifies any concerns that need to be evaluated in person or the need to arrange testing (such as labs, EKG, etc.), we will make arrangements to do so. Although advances in technology are sophisticated, we cannot ensure that it will always work on either your end or our end. If the connection with a video visit is poor, the visit may have to be switched to a telephone visit. With either a video or telephone visit, we are not always able to ensure that we have a secure connection.  By engaging in this virtual visit, you consent to the provision of healthcare and authorize for your insurance to be billed (if applicable) for the services provided during this visit. Depending on your insurance coverage, you may receive a charge related to this service.  I need to obtain your verbal consent now. Are you willing to proceed with your visit today? Kim [name] has provided verbal consent on 04/23/2023 for a virtual visit (video or telephone). [name], FNP  Date: 04/23/2023 10:52 AM  Virtual Visit via Video Note   I, [name], connected with  Kim [name]  ([id number], 12-19-1975) on 04/23/23 at 10:10 AM EST by a video-enabled telemedicine application and verified that I am speaking with the correct person using two identifiers.  Location: Patient: Virtual Visit Location  Patient: Home Provider: Virtual Visit Location Provider: Home Office   I discussed the limitations of evaluation and management by telemedicine and the availability of in person appointments. The patient expressed understanding and agreed to proceed.    History of Present Illness: Kim [name] is a 48 y.o. who identifies as a female who was assigned female at birth, and is being seen today for orders home health. She has cauda equina spinal cord injury and multiple pressure ulcers. She had a wound vac right buttocks and left buttock and is followed by wound care. She was having home health nurse coming M,W, F, but having trouble finding an agency to come change her wound vac. She currently does not have a wound vac and doing silver AG.    She has an aid 11 hours a day to help with daily activities since she is bed bound. She is suppose to be out of bed only a hour a day.   She is followed by ID and currently bactrim BID.   She is followed by Palliative Care and they are coming out once a month.   She has neurogenic bladder and has a foley cathter. However, this came out yesterday. Trying to get transport to Urologists office to get replaced.       HPI: HPI  Problems:  Patient Active Problem List   Diagnosis Date Noted   Hypotension 05/19/2022   Peripheral neuropathy 05/19/2022   Osteomyelitis of right femur (HCC) 07/09/2021   Malnutrition of moderate degree 04/16/2021  Chronic osteomyelitis of left foot with draining sinus (HCC)    Severe protein-calorie malnutrition (HCC) 04/08/2021   Sepsis with acute organ dysfunction (HCC) 04/04/2021   Pressure injury of left hip, stage 3 (HCC) 03/04/2021   Pressure injury of contiguous region involving right buttock and hip, stage 4 (HCC) 02/10/2021   Pressure injury of left heel, stage 4 (HCC) 02/10/2021   Pressure injury of skin of right hip 01/28/2021   Non-healing open wound of heel, initial encounter 04/12/2020   At risk for falls  02/09/2018   Post-menopausal osteoporosis 02/09/2018   Loosening of prosthetic hip (HCC) 06/01/2016   Encounter for routine gynecological examination with Papanicolaou smear of cervix 03/03/2016   Enlarged uterus 03/03/2016   Postmenopausal 03/03/2016   GAD (generalized anxiety disorder) 01/21/2016   Dislocation of right hip (HCC) 08/01/2015   Status post right hip replacement 07/24/2015   Neurogenic bladder 06/28/2015   Current smoker 05/13/2015   Height loss 05/13/2015   Wheelchair dependent 05/13/2015   History of developmental dysplasia of the hip 04/29/2015   Primary osteoarthritis of knees, bilateral 04/29/2015   Osteopenia    Vitamin D deficiency 05/01/2014   Heterotopic ossification of bone 08/02/2013   Pain of right hip joint 08/02/2013   Cauda equina injury without spinal bone injury (HCC) 05/26/2011   Low back pain 05/26/2011   Osteoarthritis of knee 03/23/2011   Osteoarthritis of thoracolumbar spine 03/23/2011   Left knee pain 02/08/2011   KNEE PAIN 05/23/2007   SPONDYLOSIS WITH MYELOPATHY LUMBAR REGION 05/23/2007    Allergies:  Allergies  Allergen Reactions   Keflex [Cephalexin] Diarrhea   Medications:  Current Outpatient Medications:    mirtazapine (REMERON) 7.5 MG tablet, Take 1 tablet (7.5 mg total) by mouth at bedtime., Disp: 90 tablet, Rfl: 1   acetaminophen (TYLENOL) 650 MG suppository, Place 650 mg rectally every 4 (four) hours as needed., Disp: , Rfl:    Ascorbic Acid (VITAMIN C) 500 MG CAPS, Take 500 mg by mouth daily., Disp: 90 capsule, Rfl: 11   baclofen (LIORESAL) 10 MG tablet, Take 1 tablet (10 mg total) by mouth 3 (three) times daily., Disp: 60 each, Rfl: 3   Buprenorphine HCl-Naloxone HCl 8-2 MG FILM, Place 1 Film under the tongue 3 (three) times daily., Disp: , Rfl:    buPROPion (WELLBUTRIN SR) 200 MG 12 hr tablet, Take 1 tablet (200 mg total) by mouth 2 (two) times daily., Disp: 180 tablet, Rfl: 1   CALCIUM-VITAMIN D PO, Take 1 tablet by mouth  daily., Disp: , Rfl:    clonazePAM (KLONOPIN) 0.5 MG tablet, Take 0.5 mg by mouth 2 (two) times daily., Disp: , Rfl:    clotrimazole-betamethasone (LOTRISONE) cream, Apply 1 Application topically daily., Disp: 30 g, Rfl: 2   collagenase (SANTYL) ointment, Apply topically daily., Disp: 30 g, Rfl: 0   Elastic Bandages & Supports (HEEL/ANKLE PROTECTOR) MISC, 1 each by Does not apply route at bedtime., Disp: 2 each, Rfl: 1   ferrous sulfate 325 (65 FE) MG tablet, Take 1 tablet (325 mg total) by mouth 2 (two) times daily with a meal., Disp: 60 tablet, Rfl: 5   folic acid (FOLVITE) 1 MG tablet, Take 1 tablet (1 mg total) by mouth daily., Disp: 30 tablet, Rfl: 5   Foot Care Products (SOCK AID) MISC, 1 Device by Does not apply route daily., Disp: 1 each, Rfl: 0   gabapentin (NEURONTIN) 300 MG capsule, TAKE 2 CAPSULES BY MOUTH 2 TIMES A DAY, Disp: 120 capsule, Rfl: 2  midodrine (PROAMATINE) 10 MG tablet, Take 1 tablet (10 mg total) by mouth 3 (three) times daily with meals., Disp: 90 tablet, Rfl: 2   mirabegron ER (MYRBETRIQ) 25 MG TB24 tablet, 25 mg., Disp: , Rfl:    Misc. Devices (BED WEDGE) MISC, 1 each by Does not apply route 2 (two) times daily., Disp: 2 each, Rfl: 0   Misc. Devices (TRANSFER BENCH) MISC, 1 Device by Does not apply route as needed., Disp: 1 each, Rfl: 1   Misc. Devices [facility] CUSHION) MISC, 1 application by Does not apply route daily., Disp: 1 each, Rfl: 1   Multiple Vitamins-Calcium (ONE-A-DAY WOMENS PO), Take 2 tablets by mouth daily., Disp: , Rfl:    NARCAN 4 MG/0.1ML LIQD nasal spray kit, , Disp: , Rfl:    Nutritional Supplements (ENSURE HIGH PROTEIN) LIQD, Take 237 mLs by mouth 4 (four) times daily - after meals and at bedtime., Disp: 3792 mL, Rfl: 11   Nutritional Supplements (JUVEN NUTRIVIGOR) PACK, Take 1 each by mouth 3 (three) times daily., Disp: 180 each, Rfl: 3   Nutritional Supplements (PROMOD) LIQD, Take 30 mLs by mouth 2 (two) times daily., Disp: 946 mL, Rfl: 11    nystatin (MYCOSTATIN/NYSTOP) powder, Apply 1 Application topically 3 (three) times daily., Disp: 60 g, Rfl: 2   Plecanatide (TRULANCE) 3 MG TABS, Take 3 mg by mouth daily., Disp: 90 tablet, Rfl: 1   polyethylene glycol powder (GLYCOLAX/MIRALAX) 17 GM/SCOOP powder, Take 17 g by mouth daily., Disp: 3350 g, Rfl: 1   POTASSIUM PO, Take 50 mg by mouth daily., Disp: , Rfl:    silver sulfADIAZINE (SILVADENE) 1 % cream, Apply 1 application topically daily., Disp: 50 g, Rfl: 0   sodium hypochlorite (DAKIN'S 1/2 STRENGTH) external solution, Apply to wounds as directed, Disp: , Rfl:    sulfamethoxazole-trimethoprim (BACTRIM DS) 800-160 MG tablet, Take 1 tablet by mouth 2 (two) times daily., Disp: , Rfl:    Zinc 50 MG CAPS, Take 1 capsule (50 mg total) by mouth daily., Disp: 90 capsule, Rfl: 1  Observations/Objective: Patient is well-developed, well-nourished in no acute distress.  Resting comfortably  at home.  Head is normocephalic, atraumatic.  No labored breathing.  Speech is clear and coherent with logical content.  Patient is alert and oriented at baseline.  Wounds with dressing that are intact  Assessment and Plan: 1. Cauda equina injury without spinal bone injury, sequela (Primary) - Ambulatory referral to Home Health  2. Pressure injury of contiguous region involving right buttock and hip, stage 4 (HCC) - Ambulatory referral to Home Health  3. Neurogenic bladder - Ambulatory referral to Home Health  4. Pressure injury of left heel, stage 4 (HCC) - Ambulatory referral to Home Health  Will place orders for home health  Continue wound care Continue current medications  Keep follow up with specialists   Follow Up Instructions: I discussed the assessment and treatment plan with the patient. The patient was provided an opportunity to ask questions and all were answered. The patient agreed with the plan and demonstrated an understanding of the instructions.  A copy of instructions were  sent to the patient via MyChart unless otherwise noted below.    The patient was advised to call back or seek an in-person evaluation if the symptoms worsen or if the condition fails to improve as anticipated.    [name], FNP

## 2023-04-24 ENCOUNTER — Other Ambulatory Visit: Payer: Self-pay | Admitting: Family

## 2023-04-24 DIAGNOSIS — G6289 Other specified polyneuropathies: Secondary | ICD-10-CM

## 2023-04-25 DIAGNOSIS — L89324 Pressure ulcer of left buttock, stage 4: Secondary | ICD-10-CM | POA: Diagnosis not present

## 2023-04-25 DIAGNOSIS — L89314 Pressure ulcer of right buttock, stage 4: Secondary | ICD-10-CM | POA: Diagnosis not present

## 2023-04-26 ENCOUNTER — Telehealth: Payer: Self-pay

## 2023-04-26 NOTE — Telephone Encounter (Signed)
Copied from CRM [id number]. Topic: Clinical - Home Health Verbal Orders >> Apr 26, 2023  9:54 AM Adelina Mings wrote: Caller/Agency: [facility]/ care [facility] team  Callback Number: [phone] Service Requested: wound care and foley changes  Frequency: f/u Any new concerns about the patient? No

## 2023-04-27 DIAGNOSIS — N319 Neuromuscular dysfunction of bladder, unspecified: Secondary | ICD-10-CM | POA: Diagnosis not present

## 2023-04-27 DIAGNOSIS — L89324 Pressure ulcer of left buttock, stage 4: Secondary | ICD-10-CM | POA: Diagnosis not present

## 2023-04-28 NOTE — Telephone Encounter (Signed)
TC talked w/ Wynona Canes at [facility] to let her know that we are in the process of finding a HH agency to accept pt, she will let their nurse practitioner know.

## 2023-04-29 DIAGNOSIS — F17210 Nicotine dependence, cigarettes, uncomplicated: Secondary | ICD-10-CM | POA: Diagnosis not present

## 2023-04-29 DIAGNOSIS — L89214 Pressure ulcer of right hip, stage 4: Secondary | ICD-10-CM | POA: Diagnosis not present

## 2023-04-29 DIAGNOSIS — L89224 Pressure ulcer of left hip, stage 4: Secondary | ICD-10-CM | POA: Diagnosis not present

## 2023-04-29 DIAGNOSIS — I96 Gangrene, not elsewhere classified: Secondary | ICD-10-CM | POA: Diagnosis not present

## 2023-04-29 DIAGNOSIS — D649 Anemia, unspecified: Secondary | ICD-10-CM | POA: Diagnosis not present

## 2023-05-08 DIAGNOSIS — L89314 Pressure ulcer of right buttock, stage 4: Secondary | ICD-10-CM | POA: Diagnosis not present

## 2023-05-08 DIAGNOSIS — G8220 Paraplegia, unspecified: Secondary | ICD-10-CM | POA: Diagnosis not present

## 2023-05-08 DIAGNOSIS — L89324 Pressure ulcer of left buttock, stage 4: Secondary | ICD-10-CM | POA: Diagnosis not present

## 2023-05-11 ENCOUNTER — Telehealth: Payer: Self-pay

## 2023-05-11 NOTE — Telephone Encounter (Signed)
 Patient called and left a message checking on upcoming appts or if there was one she may need to reschedule. I am currently showing there was a MyChart visit that was left in 10/2021 and the last comp appt was 08/2021.   I am happy to complete any scheduling, but wanted to verify if there was any further scheduling needs or if another referral is required?

## 2023-05-12 NOTE — Telephone Encounter (Signed)
 Called patient x2 on 05/11/2023 to follow up on the message that was left regarding upcoming appt, but no answer on first attempt and left VM on second.   Spoke with patient today regarding appt concerns. She had concerns with antibiotics and her wound, that was the reason for her call. She stated that she stated that she accidentally still had our number saved and is still continuing care at [facility]. I was able to provide her the [facility] ID # for her to call and follow up.

## 2023-05-18 ENCOUNTER — Ambulatory Visit: Payer: 59 | Admitting: Family

## 2023-05-18 ENCOUNTER — Telehealth (INDEPENDENT_AMBULATORY_CARE_PROVIDER_SITE_OTHER): Payer: 59 | Admitting: Family

## 2023-05-18 ENCOUNTER — Encounter: Payer: Self-pay | Admitting: Family

## 2023-05-18 DIAGNOSIS — F17200 Nicotine dependence, unspecified, uncomplicated: Secondary | ICD-10-CM | POA: Diagnosis not present

## 2023-05-18 DIAGNOSIS — Z716 Tobacco abuse counseling: Secondary | ICD-10-CM | POA: Diagnosis not present

## 2023-05-18 DIAGNOSIS — I959 Hypotension, unspecified: Secondary | ICD-10-CM

## 2023-05-18 DIAGNOSIS — F320 Major depressive disorder, single episode, mild: Secondary | ICD-10-CM

## 2023-05-18 DIAGNOSIS — G6289 Other specified polyneuropathies: Secondary | ICD-10-CM

## 2023-05-18 MED ORDER — MIDODRINE HCL 10 MG PO TABS: 10.0000 mg | ORAL_TABLET | Freq: Three times a day (TID) | ORAL | 2 refills | Status: AC

## 2023-05-18 MED ORDER — GABAPENTIN 300 MG PO CAPS: ORAL_CAPSULE | ORAL | 2 refills | Status: DC

## 2023-05-18 MED ORDER — BUPROPION HCL ER (SR) 200 MG PO TB12: 200.0000 mg | ORAL_TABLET | Freq: Two times a day (BID) | ORAL | 1 refills | Status: DC

## 2023-05-18 NOTE — Progress Notes (Signed)
Virtual Visit Consent   Kim [name], you are scheduled for a virtual visit with a [facility] provider today. Just as with appointments in the office, your consent must be obtained to participate. Your consent will be active for this visit and any virtual visit you may have with one of our providers in the next 365 days. If you have a MyChart account, a copy of this consent can be sent to you electronically.  As this is a virtual visit, video technology does not allow for your provider to perform a traditional examination. This may limit your provider's ability to fully assess your condition. If your provider identifies any concerns that need to be evaluated in person or the need to arrange testing (such as labs, EKG, etc.), we will make arrangements to do so. Although advances in technology are sophisticated, we cannot ensure that it will always work on either your end or our end. If the connection with a video visit is poor, the visit may have to be switched to a telephone visit. With either a video or telephone visit, we are not always able to ensure that we have a secure connection.  By engaging in this virtual visit, you consent to the provision of healthcare and authorize for your insurance to be billed (if applicable) for the services provided during this visit. Depending on your insurance coverage, you may receive a charge related to this service.  I need to obtain your verbal consent now. Are you willing to proceed with your visit today? Jozi L Taffe has provided verbal consent on 05/18/2023 for a virtual visit (video or telephone). [name], FNP  Date: 05/18/2023 3:28 PM   Virtual Visit via Video Note   I, [name], connected with  Kim [name]  ([id number], 1975-10-03) on 05/18/23 at  2:25 PM EST by a video-enabled telemedicine application and verified that I am speaking with the correct person using two identifiers.  Location: Patient: Virtual Visit Location  Patient: Home Provider: Virtual Visit Location Provider: Home Office   I discussed the limitations of evaluation and management by telemedicine and the availability of in person appointments. The patient expressed understanding and agreed to proceed.    History of Present Illness: Kim [name] is a 48 y.o. who identifies as a female who was assigned female at birth, and is being seen today for She has cauda equina spinal cord injury and multiple pressure ulcers. She had a wound vac right buttocks and left buttock and is followed by wound care. She was having home health nurse coming M,W, F, but having trouble finding an agency to come change her wound vac. She currently does not have a wound vac and doing silver AG.    She has an aid 11 hours a day to help with daily activities since she is bed bound. She is suppose to be out of bed only a hour a day.   She is followed by ID and currently bactrim BID.    She is followed by Palliative Care and they are coming out once a month.    She has neurogenic bladder and has a foley cathter. However, this came out yesterday. Trying to get transport to Urologists office to get replaced.   She is smoking a pack a day, but has a quit date of March 1. '  She has neuropathy constant burning pain in left lower leg that is a 7 out 10.   HPI: Depression  This is a chronic problem.  The current episode started more than 1 year ago.   The onset quality is gradual.   The problem occurs intermittently.  Associated symptoms include fatigue and sad.  Associated symptoms include no helplessness and no hopelessness. Nicotine Dependence Presents for follow-up visit. Symptoms include fatigue. Her urge triggers include company of smokers. She smokes 1 pack of cigarettes per day.    Problems:  Patient Active Problem List   Diagnosis Date Noted   Hypotension 05/19/2022   Peripheral neuropathy 05/19/2022   Osteomyelitis of right femur (HCC) 07/09/2021    Malnutrition of moderate degree 04/16/2021   Chronic osteomyelitis of left foot with draining sinus (HCC)    Severe protein-calorie malnutrition (HCC) 04/08/2021   Sepsis with acute organ dysfunction (HCC) 04/04/2021   Pressure injury of left hip, stage 3 (HCC) 03/04/2021   Pressure injury of contiguous region involving right buttock and hip, stage 4 (HCC) 02/10/2021   Pressure injury of left heel, stage 4 (HCC) 02/10/2021   Pressure injury of skin of right hip 01/28/2021   Non-healing open wound of heel, initial encounter 04/12/2020   At risk for falls 02/09/2018   Post-menopausal osteoporosis 02/09/2018   Loosening of prosthetic hip (HCC) 06/01/2016   Encounter for routine gynecological examination with Papanicolaou smear of cervix 03/03/2016   Enlarged uterus 03/03/2016   Postmenopausal 03/03/2016   GAD (generalized anxiety disorder) 01/21/2016   Dislocation of right hip (HCC) 08/01/2015   Status post right hip replacement 07/24/2015   Neurogenic bladder 06/28/2015   Current smoker 05/13/2015   Height loss 05/13/2015   Wheelchair dependent 05/13/2015   History of developmental dysplasia of the hip 04/29/2015   Primary osteoarthritis of knees, bilateral 04/29/2015   Osteopenia    Vitamin D deficiency 05/01/2014   Heterotopic ossification of bone 08/02/2013   Pain of right hip joint 08/02/2013   Cauda equina injury without spinal bone injury (HCC) 05/26/2011   Low back pain 05/26/2011   Osteoarthritis of knee 03/23/2011   Osteoarthritis of thoracolumbar spine 03/23/2011   Left knee pain 02/08/2011   KNEE PAIN 05/23/2007   SPONDYLOSIS WITH MYELOPATHY LUMBAR REGION 05/23/2007    Allergies:  Allergies  Allergen Reactions   Keflex [Cephalexin] Diarrhea   Medications:  Current Outpatient Medications:    mirtazapine (REMERON) 7.5 MG tablet, Take 1 tablet (7.5 mg total) by mouth at bedtime., Disp: 90 tablet, Rfl: 1   acetaminophen (TYLENOL) 650 MG suppository, Place 650 mg  rectally every 4 (four) hours as needed., Disp: , Rfl:    Ascorbic Acid (VITAMIN C) 500 MG CAPS, Take 500 mg by mouth daily., Disp: 90 capsule, Rfl: 11   baclofen (LIORESAL) 10 MG tablet, Take 1 tablet (10 mg total) by mouth 3 (three) times daily., Disp: 60 each, Rfl: 3   Buprenorphine HCl-Naloxone HCl 8-2 MG FILM, Place 1 Film under the tongue 3 (three) times daily., Disp: , Rfl:    buPROPion (WELLBUTRIN SR) 200 MG 12 hr tablet, Take 1 tablet (200 mg total) by mouth 2 (two) times daily., Disp: 180 tablet, Rfl: 1   CALCIUM-VITAMIN D PO, Take 1 tablet by mouth daily., Disp: , Rfl:    clonazePAM (KLONOPIN) 0.5 MG tablet, Take 0.5 mg by mouth 2 (two) times daily., Disp: , Rfl:    clotrimazole-betamethasone (LOTRISONE) cream, Apply 1 Application topically daily., Disp: 30 g, Rfl: 2   collagenase (SANTYL) ointment, Apply topically daily., Disp: 30 g, Rfl: 0   Elastic Bandages & Supports (HEEL/ANKLE PROTECTOR)  MISC, 1 each by Does not apply route at bedtime., Disp: 2 each, Rfl: 1   ferrous sulfate 325 (65 FE) MG tablet, Take 1 tablet (325 mg total) by mouth 2 (two) times daily with a meal., Disp: 60 tablet, Rfl: 5   folic acid (FOLVITE) 1 MG tablet, Take 1 tablet (1 mg total) by mouth daily., Disp: 30 tablet, Rfl: 5   Foot Care Products (SOCK AID) MISC, 1 Device by Does not apply route daily., Disp: 1 each, Rfl: 0   gabapentin (NEURONTIN) 300 MG capsule, TAKE 2 CAPSULES BY MOUTH 2 TIMES A DAY, Disp: 120 capsule, Rfl: 2   midodrine (PROAMATINE) 10 MG tablet, Take 1 tablet (10 mg total) by mouth 3 (three) times daily with meals., Disp: 90 tablet, Rfl: 2   mirabegron ER (MYRBETRIQ) 25 MG TB24 tablet, 25 mg., Disp: , Rfl:    Misc. Devices (BED WEDGE) MISC, 1 each by Does not apply route 2 (two) times daily., Disp: 2 each, Rfl: 0   Misc. Devices (TRANSFER BENCH) MISC, 1 Device by Does not apply route as needed., Disp: 1 each, Rfl: 1   Misc. Devices [facility] CUSHION) MISC, 1 application by Does not apply  route daily., Disp: 1 each, Rfl: 1   Multiple Vitamins-Calcium (ONE-A-DAY WOMENS PO), Take 2 tablets by mouth daily., Disp: , Rfl:    NARCAN 4 MG/0.1ML LIQD nasal spray kit, , Disp: , Rfl:    Nutritional Supplements (ENSURE HIGH PROTEIN) LIQD, Take 237 mLs by mouth 4 (four) times daily - after meals and at bedtime., Disp: 3792 mL, Rfl: 11   Nutritional Supplements (JUVEN NUTRIVIGOR) PACK, Take 1 each by mouth 3 (three) times daily., Disp: 180 each, Rfl: 3   Nutritional Supplements (PROMOD) LIQD, Take 30 mLs by mouth 2 (two) times daily., Disp: 946 mL, Rfl: 11   nystatin (MYCOSTATIN/NYSTOP) powder, Apply 1 Application topically 3 (three) times daily., Disp: 60 g, Rfl: 2   Plecanatide (TRULANCE) 3 MG TABS, Take 3 mg by mouth daily., Disp: 90 tablet, Rfl: 1   polyethylene glycol powder (GLYCOLAX/MIRALAX) 17 GM/SCOOP powder, Take 17 g by mouth daily., Disp: 3350 g, Rfl: 1   POTASSIUM PO, Take 50 mg by mouth daily., Disp: , Rfl:    silver sulfADIAZINE (SILVADENE) 1 % cream, Apply 1 application topically daily., Disp: 50 g, Rfl: 0   sodium hypochlorite (DAKIN'S 1/2 STRENGTH) external solution, Apply to wounds as directed, Disp: , Rfl:    sulfamethoxazole-trimethoprim (BACTRIM DS) 800-160 MG tablet, Take 1 tablet by mouth 2 (two) times daily., Disp: , Rfl:    Zinc 50 MG CAPS, Take 1 capsule (50 mg total) by mouth daily., Disp: 90 capsule, Rfl: 1  Observations/Objective: Patient is well-developed, well-nourished in no acute distress.  Resting comfortably  at home.  Head is normocephalic, atraumatic.  No labored breathing.  Speech is clear and coherent with logical content.  Patient is alert and oriented at baseline.  Laying in bed In good spirits at this time.   Assessment and Plan: 1. Hypotension, unspecified hypotension type - midodrine (PROAMATINE) 10 MG tablet; Take 1 tablet (10 mg total) by mouth 3 (three) times daily with meals.  Dispense: 90 tablet; Refill: 2  2. Other polyneuropathy -  gabapentin (NEURONTIN) 300 MG capsule; TAKE 2 CAPSULES BY MOUTH 2 TIMES A DAY  Dispense: 120 capsule; Refill: 2  3. Encounter for smoking cessation counseling - buPROPion (WELLBUTRIN SR) 200 MG 12 hr tablet; Take 1 tablet (200 mg total) by mouth 2 (  two) times daily.  Dispense: 180 tablet; Refill: 1  4. Current smoker - buPROPion (WELLBUTRIN SR) 200 MG 12 hr tablet; Take 1 tablet (200 mg total) by mouth 2 (two) times daily.  Dispense: 180 tablet; Refill: 1  5. Depression, major, single episode, mild (HCC) - buPROPion (WELLBUTRIN SR) 200 MG 12 hr tablet; Take 1 tablet (200 mg total) by mouth 2 (two) times daily.  Dispense: 180 tablet; Refill: 1  Continue current medications  Keep all follow up with specialists  Continue with wound care and keeping wound clean and dry Smoking cessation discussed, has goal to quit smoking March 1! Follow up in 3 months   Follow Up Instructions: I discussed the assessment and treatment plan with the patient. The patient was provided an opportunity to ask questions and all were answered. The patient agreed with the plan and demonstrated an understanding of the instructions.  A copy of instructions were sent to the patient via MyChart unless otherwise noted below.     The patient was advised to call back or seek an in-person evaluation if the symptoms worsen or if the condition fails to improve as anticipated.    [name], FNP

## 2023-05-19 ENCOUNTER — Ambulatory Visit: Payer: 59 | Admitting: Podiatry

## 2023-05-25 ENCOUNTER — Ambulatory Visit: Payer: 59 | Admitting: Podiatry

## 2023-06-03 DIAGNOSIS — L89214 Pressure ulcer of right hip, stage 4: Secondary | ICD-10-CM | POA: Diagnosis not present

## 2023-06-03 DIAGNOSIS — L89224 Pressure ulcer of left hip, stage 4: Secondary | ICD-10-CM | POA: Diagnosis not present

## 2023-06-03 DIAGNOSIS — G8220 Paraplegia, unspecified: Secondary | ICD-10-CM | POA: Diagnosis not present

## 2023-06-03 DIAGNOSIS — F17210 Nicotine dependence, cigarettes, uncomplicated: Secondary | ICD-10-CM | POA: Diagnosis not present

## 2023-06-03 DIAGNOSIS — E440 Moderate protein-calorie malnutrition: Secondary | ICD-10-CM | POA: Diagnosis not present

## 2023-06-03 DIAGNOSIS — L89324 Pressure ulcer of left buttock, stage 4: Secondary | ICD-10-CM | POA: Diagnosis not present

## 2023-06-03 DIAGNOSIS — L89313 Pressure ulcer of right buttock, stage 3: Secondary | ICD-10-CM | POA: Diagnosis not present

## 2023-06-03 DIAGNOSIS — N319 Neuromuscular dysfunction of bladder, unspecified: Secondary | ICD-10-CM | POA: Diagnosis not present

## 2023-06-03 DIAGNOSIS — F17200 Nicotine dependence, unspecified, uncomplicated: Secondary | ICD-10-CM | POA: Diagnosis not present

## 2023-06-05 DIAGNOSIS — L89314 Pressure ulcer of right buttock, stage 4: Secondary | ICD-10-CM | POA: Diagnosis not present

## 2023-06-05 DIAGNOSIS — L89324 Pressure ulcer of left buttock, stage 4: Secondary | ICD-10-CM | POA: Diagnosis not present

## 2023-06-05 DIAGNOSIS — G8220 Paraplegia, unspecified: Secondary | ICD-10-CM | POA: Diagnosis not present

## 2023-06-07 DIAGNOSIS — M8639 Chronic multifocal osteomyelitis, multiple sites: Secondary | ICD-10-CM | POA: Diagnosis not present

## 2023-06-08 ENCOUNTER — Ambulatory Visit: Payer: 59 | Admitting: Podiatry

## 2023-06-08 DIAGNOSIS — L89324 Pressure ulcer of left buttock, stage 4: Secondary | ICD-10-CM | POA: Diagnosis not present

## 2023-06-14 ENCOUNTER — Telehealth: Payer: Self-pay | Admitting: Podiatry

## 2023-06-14 NOTE — Telephone Encounter (Signed)
 Pt left message Friday 3/7 at 234pm to cxl appt for 3/11 at 1015 as she r/s it but did not remember to schedule transportation. She had r/s from 3/4 due to not feeling well.  I returned call and her voicemail is full but have canceled the appt.

## 2023-06-15 ENCOUNTER — Ambulatory Visit: Admitting: Podiatry

## 2023-06-16 DIAGNOSIS — Z9889 Other specified postprocedural states: Secondary | ICD-10-CM | POA: Diagnosis not present

## 2023-06-16 DIAGNOSIS — M1632 Unilateral osteoarthritis resulting from hip dysplasia, left hip: Secondary | ICD-10-CM | POA: Diagnosis not present

## 2023-06-28 DIAGNOSIS — N319 Neuromuscular dysfunction of bladder, unspecified: Secondary | ICD-10-CM | POA: Diagnosis not present

## 2023-06-29 NOTE — Patient Instructions (Signed)
 Visit Information  Thank you for taking time to visit with me today. Please don't hesitate to contact me if I can be of assistance to you.   Following are the goals we discussed today:   Goals Addressed             This Visit's Progress    [facility] care coordination services       Interventions Today    Flowsheet Row Most Recent Value  Chronic Disease   Chronic disease during today's visit Other  [wound]  General Interventions   General Interventions Discussed/Reviewed General Interventions Reviewed, Doctor Visits, Community Resources, Horticulturist, commercial (DME)  Doctor Visits Discussed/Reviewed Doctor Visits Reviewed, PCP  Durable Medical Equipment (DME) Other  [wound vac]  PCP/Specialist Visits Compliance with follow-up visit  Education Interventions   Education Provided Provided Education  [wound care]  Provided Verbal Education On Other              Our next appointment is by telephone on pending call from patient at ?  Please call the care guide team at [phone] if you need to cancel or reschedule your appointment.   If you are experiencing a Mental Health or Behavioral Health Crisis or need someone to talk to, please call the Suicide and Crisis Lifeline: 988 call the [place] National Suicide Prevention Lifeline: (214)147-0183 or TTY: 315-791-8071 TTY 657-733-3333) to talk to a trained counselor call 1-800-273-TALK (toll free, 24 hour hotline) call the [facility]: [phone] call 911   Patient verbalizes understanding of instructions and care plan provided today and agrees to view in MyChart. Active MyChart status and patient understanding of how to access instructions and care plan via MyChart confirmed with patient.     The patient has been provided with contact information for the care management team and has been advised to call with any health related questions or concerns.   Laurette Villescas L. Noelle Penner, RN, BSN, CCM [facility] RN Care  Manager [phone]

## 2023-07-01 DIAGNOSIS — M8668 Other chronic osteomyelitis, other site: Secondary | ICD-10-CM | POA: Diagnosis not present

## 2023-07-01 DIAGNOSIS — F17210 Nicotine dependence, cigarettes, uncomplicated: Secondary | ICD-10-CM | POA: Diagnosis not present

## 2023-07-01 DIAGNOSIS — L89313 Pressure ulcer of right buttock, stage 3: Secondary | ICD-10-CM | POA: Diagnosis not present

## 2023-07-01 DIAGNOSIS — L89214 Pressure ulcer of right hip, stage 4: Secondary | ICD-10-CM | POA: Diagnosis not present

## 2023-07-01 DIAGNOSIS — L89324 Pressure ulcer of left buttock, stage 4: Secondary | ICD-10-CM | POA: Diagnosis not present

## 2023-07-01 DIAGNOSIS — L89224 Pressure ulcer of left hip, stage 4: Secondary | ICD-10-CM | POA: Diagnosis not present

## 2023-07-02 IMAGING — DX DG CHEST 1V PORT
1 series · 1 of 1 positions shown · non-contrast
Comparison: April 03, 2021

CLINICAL DATA: Central line placement.

EXAM:
PORTABLE CHEST 1 VIEW

[chest ap]
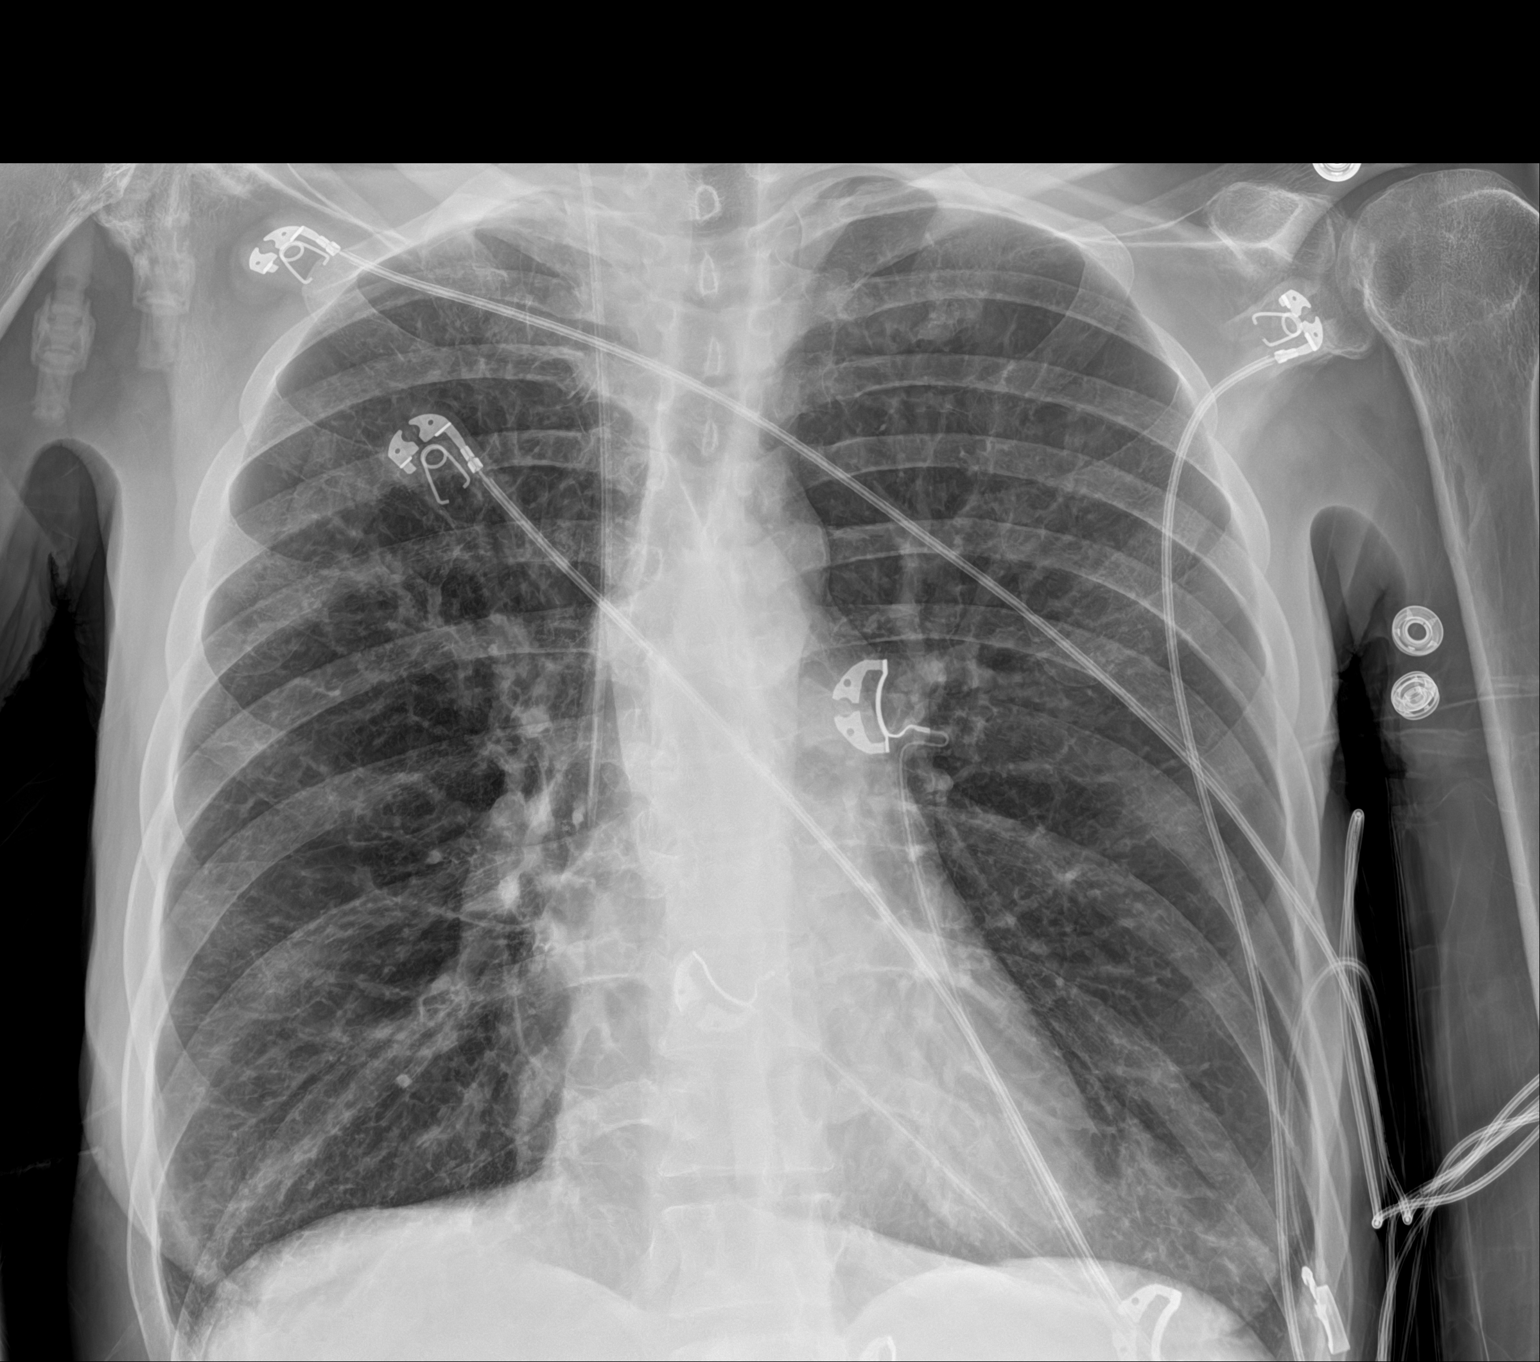

[1 of 1 positions shown; findings below may reference images not displayed]

FINDINGS: The right internal jugular venous catheter is seen with its distal
tip noted at the junction of the superior vena cava and right
atrium. Very mild atelectasis versus early infiltrate is seen within
the left lung base. There is no evidence of a pleural effusion or
pneumothorax. A skin fold is suspected along the lateral aspect of
the upper right lung. The heart size and mediastinal contours are
within normal limits. The visualized skeletal structures are
unremarkable.
IMPRESSION: Right internal jugular venous catheter placement positioning, as
described above.

## 2023-07-06 DIAGNOSIS — L89314 Pressure ulcer of right buttock, stage 4: Secondary | ICD-10-CM | POA: Diagnosis not present

## 2023-07-06 DIAGNOSIS — L89324 Pressure ulcer of left buttock, stage 4: Secondary | ICD-10-CM | POA: Diagnosis not present

## 2023-07-06 DIAGNOSIS — G8220 Paraplegia, unspecified: Secondary | ICD-10-CM | POA: Diagnosis not present

## 2023-07-11 ENCOUNTER — Other Ambulatory Visit: Payer: Self-pay | Admitting: Family

## 2023-07-14 DIAGNOSIS — L89324 Pressure ulcer of left buttock, stage 4: Secondary | ICD-10-CM | POA: Diagnosis not present

## 2023-07-22 DIAGNOSIS — E46 Unspecified protein-calorie malnutrition: Secondary | ICD-10-CM | POA: Diagnosis not present

## 2023-07-22 DIAGNOSIS — F17210 Nicotine dependence, cigarettes, uncomplicated: Secondary | ICD-10-CM | POA: Diagnosis not present

## 2023-07-22 DIAGNOSIS — L89214 Pressure ulcer of right hip, stage 4: Secondary | ICD-10-CM | POA: Diagnosis not present

## 2023-07-22 DIAGNOSIS — D649 Anemia, unspecified: Secondary | ICD-10-CM | POA: Diagnosis not present

## 2023-07-22 DIAGNOSIS — I96 Gangrene, not elsewhere classified: Secondary | ICD-10-CM | POA: Diagnosis not present

## 2023-07-22 DIAGNOSIS — F17200 Nicotine dependence, unspecified, uncomplicated: Secondary | ICD-10-CM | POA: Diagnosis not present

## 2023-07-22 DIAGNOSIS — L89224 Pressure ulcer of left hip, stage 4: Secondary | ICD-10-CM | POA: Diagnosis not present

## 2023-07-22 DIAGNOSIS — G8929 Other chronic pain: Secondary | ICD-10-CM | POA: Diagnosis not present

## 2023-07-22 DIAGNOSIS — N3289 Other specified disorders of bladder: Secondary | ICD-10-CM | POA: Diagnosis not present

## 2023-08-02 IMAGING — DX DG TIBIA/FIBULA 2V*R*
4 series · 4 of 4 positions shown · non-contrast
Comparison: None.

CLINICAL DATA: Pain.  Protrusion.

EXAM:
RIGHT TIBIA AND FIBULA - 2 VIEW

[tibia ap (1 of 2)]
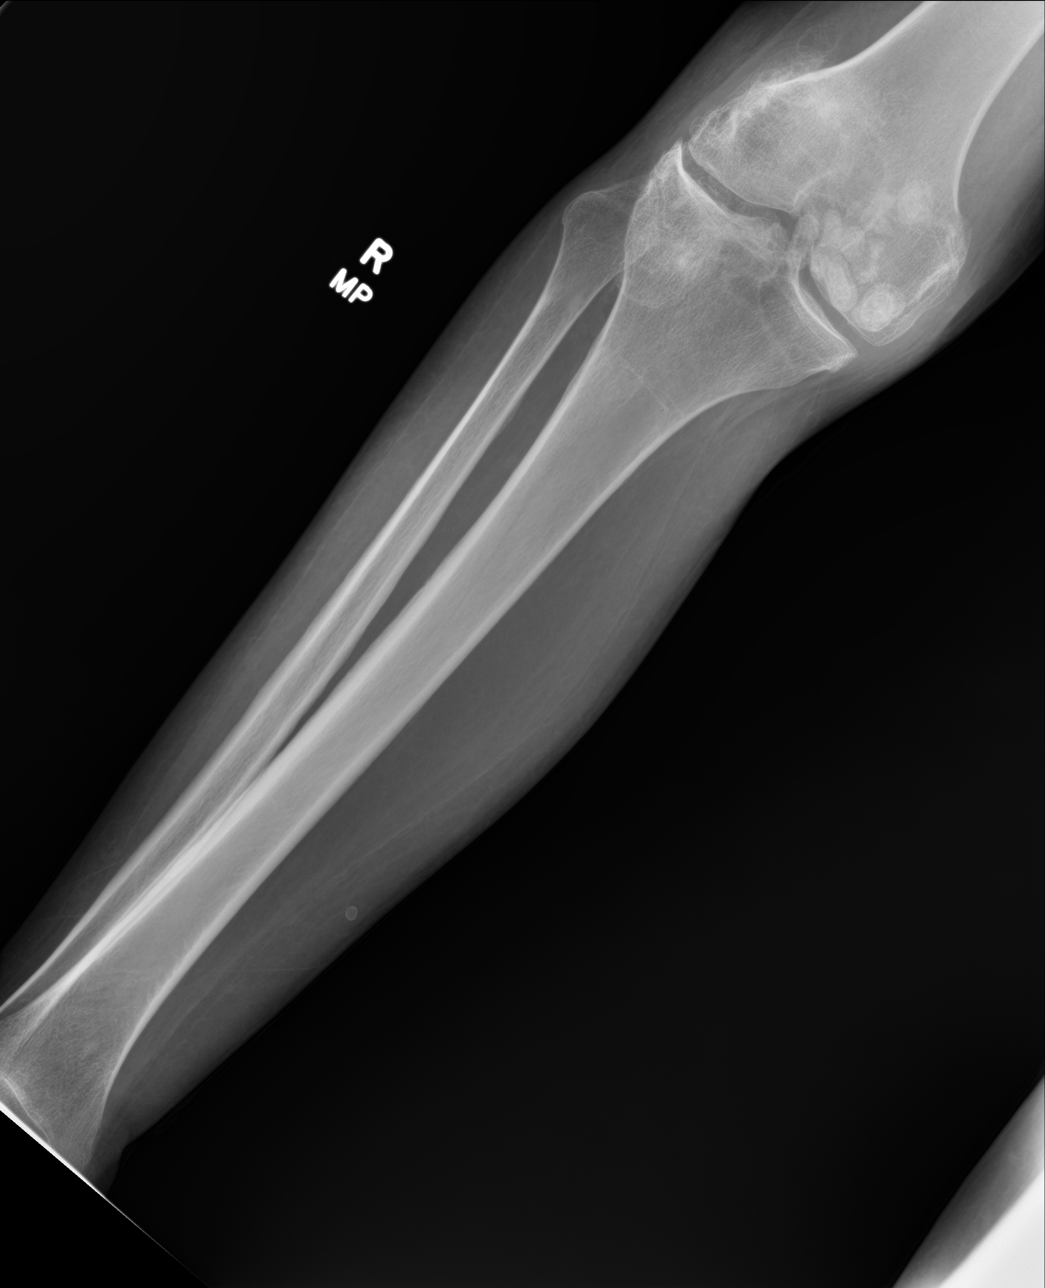

[tibia ap (2 of 2)]
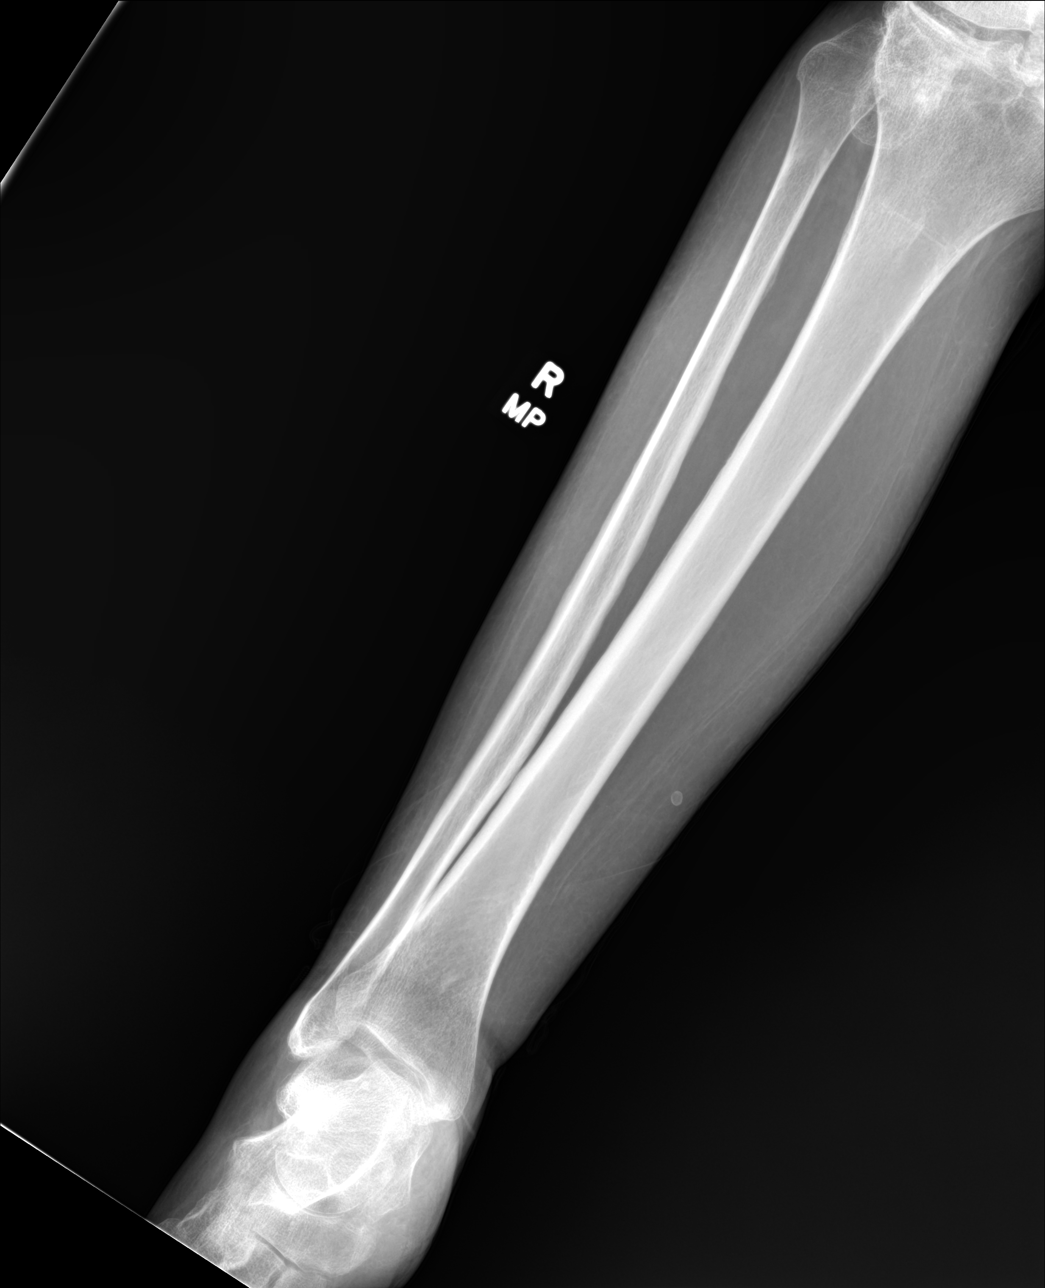

[tibia lat (1 of 2)]
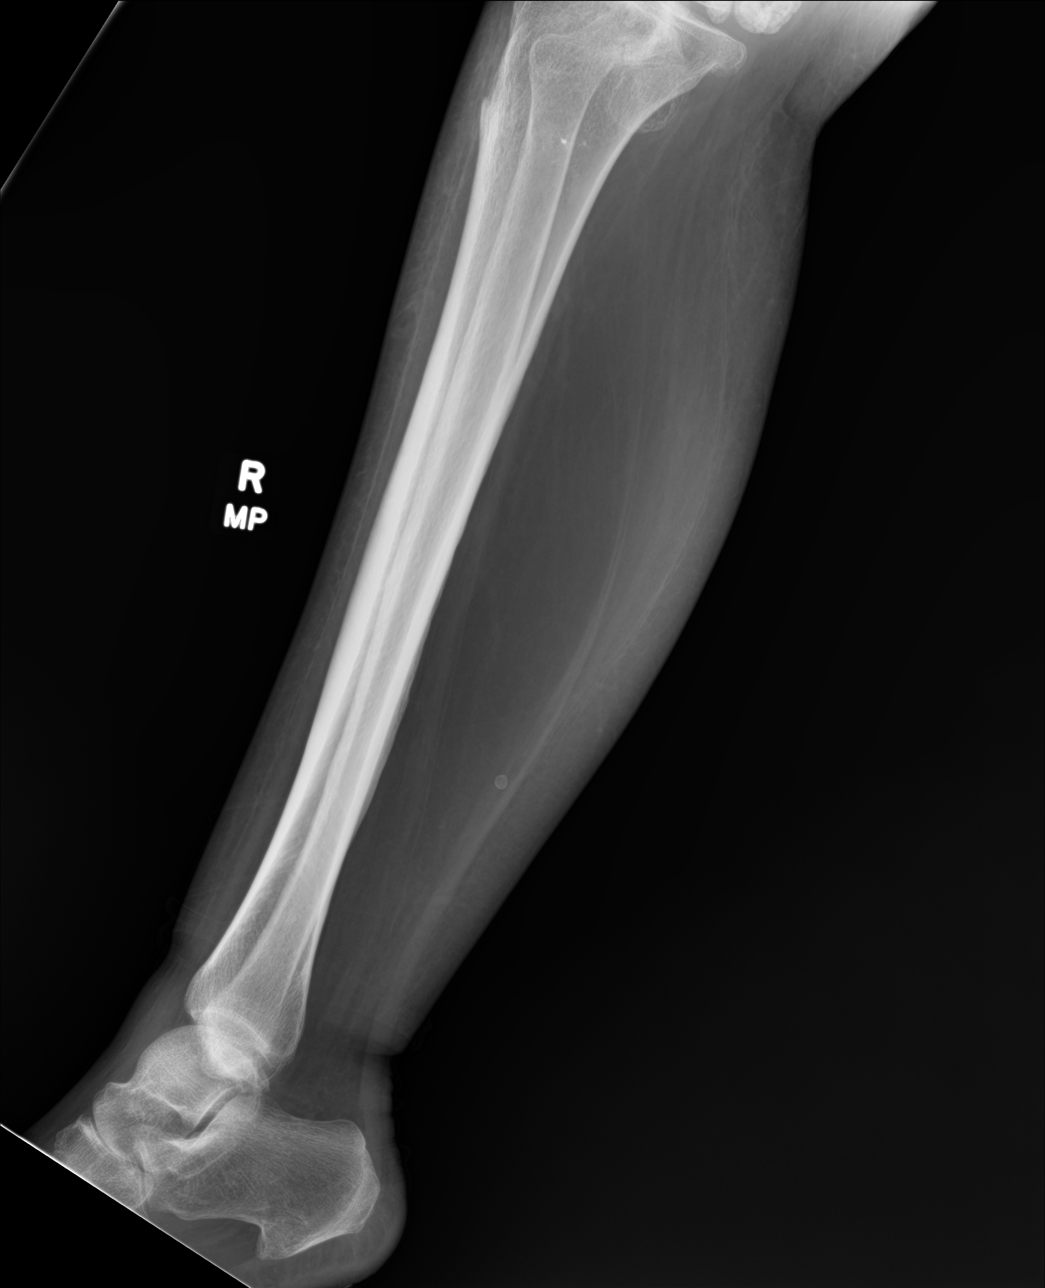

[tibia lat (2 of 2)]
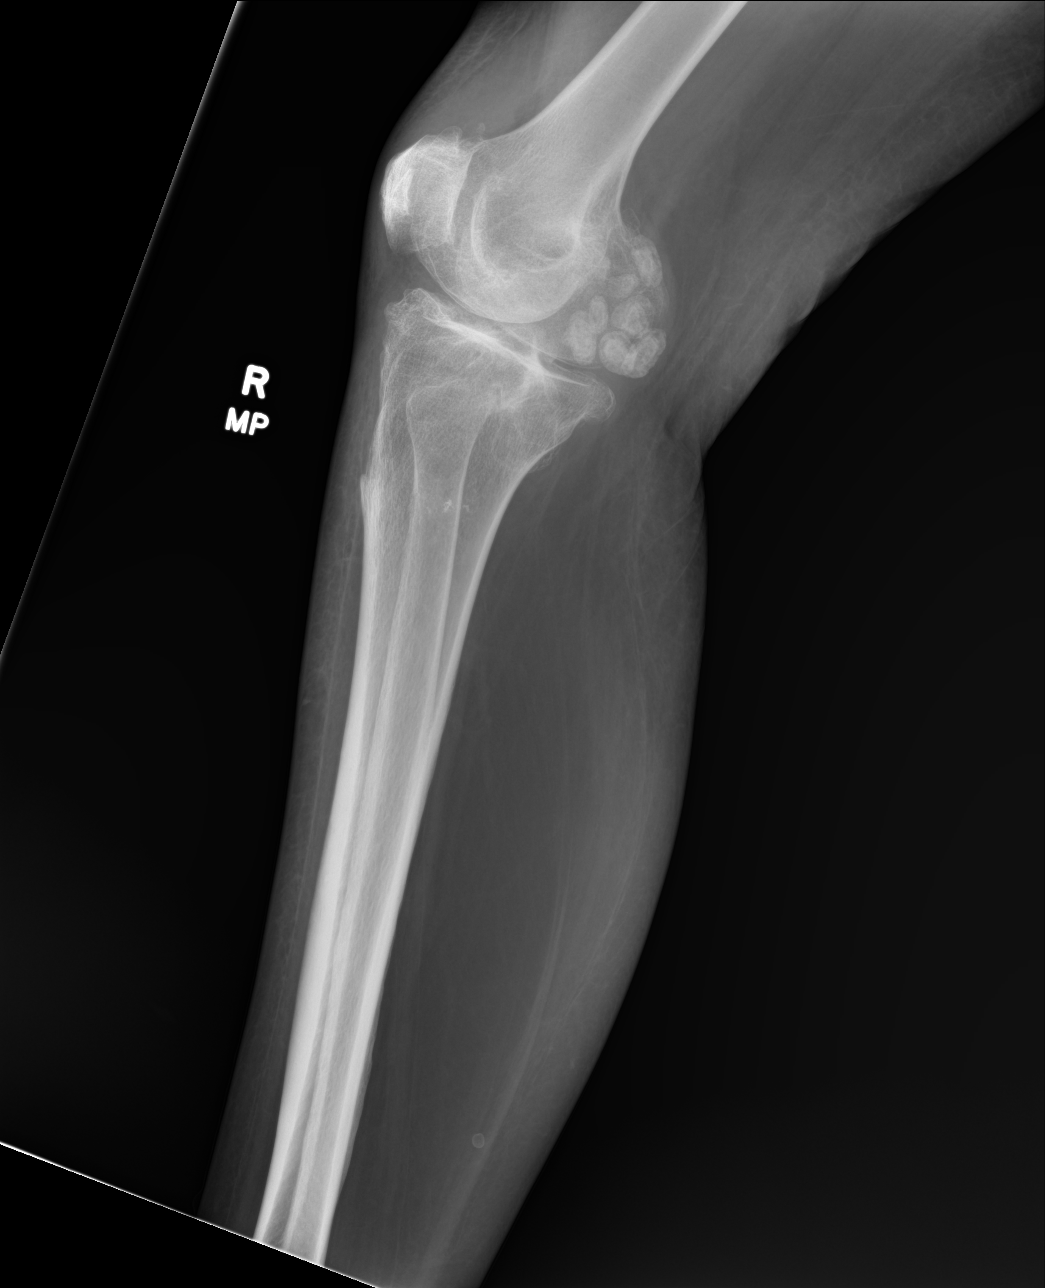

[4 of 4 positions shown; findings below may reference images not displayed]

FINDINGS: There is no evidence for acute fracture or dislocation. There are
mild degenerative changes of the medial and lateral compartments of
the knee with joint space narrowing. Multiple (approximately seven)
calcific soft tissue densities measuring up to 1.8 cm are seen in
the region of the posterior knee, indeterminate. No other focal
osseous lesion identified. Soft tissues are otherwise within normal
limits.
IMPRESSION: 1. No acute bony abnormality.
2. Mild degenerative changes of the right knee.
3. Approximally 7 calcific densities in the region of the posterior
right knee, indeterminate. Loose bodies are not excluded. Consider
further evaluation with dedicated x-rays of the right knee or MRI as
clinically appropriate.

## 2023-08-05 DIAGNOSIS — G8220 Paraplegia, unspecified: Secondary | ICD-10-CM | POA: Diagnosis not present

## 2023-08-05 DIAGNOSIS — L89324 Pressure ulcer of left buttock, stage 4: Secondary | ICD-10-CM | POA: Diagnosis not present

## 2023-08-05 DIAGNOSIS — L89314 Pressure ulcer of right buttock, stage 4: Secondary | ICD-10-CM | POA: Diagnosis not present

## 2023-08-09 ENCOUNTER — Encounter: Payer: Self-pay | Admitting: Obstetrics and Gynecology

## 2023-08-09 ENCOUNTER — Ambulatory Visit: Admitting: Obstetrics and Gynecology

## 2023-08-09 ENCOUNTER — Other Ambulatory Visit (HOSPITAL_COMMUNITY)
Admission: RE | Admit: 2023-08-09 | Discharge: 2023-08-09 | Disposition: A | Discharge status: Home / Self Care | Source: Ambulatory Visit | Attending: Obstetrics and Gynecology | Admitting: Obstetrics and Gynecology

## 2023-08-09 VITALS — BP 124/83 | HR 84

## 2023-08-09 DIAGNOSIS — Z1151 Encounter for screening for human papillomavirus (HPV): Secondary | ICD-10-CM | POA: Diagnosis not present

## 2023-08-09 DIAGNOSIS — S343XXS Injury of cauda equina, sequela: Secondary | ICD-10-CM

## 2023-08-09 DIAGNOSIS — Z01419 Encounter for gynecological examination (general) (routine) without abnormal findings: Secondary | ICD-10-CM | POA: Diagnosis not present

## 2023-08-09 DIAGNOSIS — Z1239 Encounter for other screening for malignant neoplasm of breast: Secondary | ICD-10-CM

## 2023-08-09 DIAGNOSIS — Z780 Asymptomatic menopausal state: Secondary | ICD-10-CM | POA: Diagnosis not present

## 2023-08-09 NOTE — Progress Notes (Signed)
 ANNUAL EXAM Patient name: Kim [name] MRN [id number]  Date of birth: 1975-09-25 Chief Complaint:   Gynecologic Exam  History of Present Illness:   Kim [name] is a 48 y.o. [phone]  female being seen today for a routine annual exam.  Current complaints: here for annual well woman exam. Sees primary care regularly  Hx of cauda equina spinal cord injury, bedridden, currently has two pressure ulcers followed by wound care. . She has neurogenic bladder and has a catheter in place that she has replaced monthly. She is followed by multiple specialties. Last pap 2017. Has not had recent mammogram.   Patient's last menstrual period was 11/24/2012. postmenopausal   Upstream - 08/09/23 1059       Pregnancy Intention Screening   Does the patient want to become pregnant in the next year? N/A    Does the patient's partner want to become pregnant in the next year? N/A    Would the patient like to discuss contraceptive options today? N/A      Contraception Wrap Up   Contraception Counseling Provided No             Last pap 2017. Results were: NILM w/ HRHPV negative. H/O abnormal pap: no Last mammogram: diagnostic in 2012. Results were: normal. Family h/o breast cancer: no      08/09/2023   10:59 AM 07/09/2022    2:51 PM 07/09/2022    2:50 PM 08/22/2021    2:16 PM 08/01/2021    1:58 PM  Depression screen PHQ 2/9  Decreased Interest 0  0 0 0  Down, Depressed, Hopeless 1 -- 1 0 1  PHQ - 2 Score 1  1 0 1  Altered sleeping 1      Tired, decreased energy 2      Change in appetite 1      Feeling bad or failure about yourself  1      Trouble concentrating 0      Moving slowly or fidgety/restless 0      Suicidal thoughts 0      PHQ-9 Score 6            08/09/2023   10:59 AM 07/22/2021    1:53 PM  GAD 7 : Generalized Anxiety Score  Nervous, Anxious, on Edge 1 2  Control/stop worrying 1 2  Worry too much - different things 1 1  Trouble relaxing 1 1  Restless 0 0  Easily  annoyed or irritable 1 1  Afraid - awful might happen 0 1  Total GAD 7 Score 5 8  Anxiety Difficulty  Somewhat difficult     Review of Systems:   Pertinent items are noted in HPI Denies any headaches, blurred vision, fatigue, shortness of breath, chest pain, abdominal pain, abnormal vaginal discharge/itching/odor/irritation, problems with periods, bowel movements, urination, or intercourse unless otherwise stated above. Pertinent History Reviewed:  Reviewed past medical,surgical, social and family history.  Reviewed problem list, medications and allergies. Physical Assessment:   Vitals:   08/09/23 1051  BP: 124/83  Pulse: 84  There is no height or weight on file to calculate BMI.        Physical Examination:   General appearance - well appearing, and in no distress  Mental status - alert, oriented  Psych:  She has a normal mood and affect  Chest - effort normal  Breasts - breasts appear normal, no suspicious masses, no skin or nipple changes or  axillary nodes  Abdomen - soft,  nontender  Pelvic - VULVA: normal appearing vulva with no masses, tenderness or lesions  VAGINA: normal appearing vagina with normal color and discharge, no lesions  CERVIX: normal appearing cervix without discharge or lesions, no CMT  Thin prep pap is done w HR HPV cotesting  UTERUS: uterus is felt to be normal size, shape, consistency and nontender   ADNEXA: No adnexal masses or tenderness noted.  Chaperone present for exam  No results found for this or any previous visit (from the past 24 hours).  Assessment & Plan:  1. Encounter for annual routine gynecological examination (Primary) Pap today Foley catheter in place, continue meds for neurogenic  Ordered mammogram today Continue care with specialites   2. Encounter for gynecological examination with Papanicolaou smear of cervix  - Cytology - PAP( [facility])  3. Cauda equina injury without spinal bone injury, sequela   4. Encounter for  breast cancer screening other than mammogram  - MM 3D SCREENING MAMMOGRAM BILATERAL BREAST; Future  Labs/procedures today:   Mammogram: schedule screening mammo as soon as possible, or sooner if problems   Orders Placed This Encounter  Procedures   MM 3D SCREENING MAMMOGRAM BILATERAL BREAST    Meds: No orders of the defined types were placed in this encounter.   Follow-up: Return in about 1 year (around 08/08/2024) for Zoanne Hinders, FNP

## 2023-08-11 DIAGNOSIS — Z515 Encounter for palliative care: Secondary | ICD-10-CM | POA: Diagnosis not present

## 2023-08-11 DIAGNOSIS — M62462 Contracture of muscle, left lower leg: Secondary | ICD-10-CM | POA: Diagnosis not present

## 2023-08-11 DIAGNOSIS — G834 Cauda equina syndrome: Secondary | ICD-10-CM | POA: Diagnosis not present

## 2023-08-11 DIAGNOSIS — M21371 Foot drop, right foot: Secondary | ICD-10-CM | POA: Diagnosis not present

## 2023-08-12 DIAGNOSIS — Z9889 Other specified postprocedural states: Secondary | ICD-10-CM | POA: Diagnosis not present

## 2023-08-12 DIAGNOSIS — E46 Unspecified protein-calorie malnutrition: Secondary | ICD-10-CM | POA: Diagnosis not present

## 2023-08-12 DIAGNOSIS — L89224 Pressure ulcer of left hip, stage 4: Secondary | ICD-10-CM | POA: Diagnosis not present

## 2023-08-12 DIAGNOSIS — F17210 Nicotine dependence, cigarettes, uncomplicated: Secondary | ICD-10-CM | POA: Diagnosis not present

## 2023-08-12 DIAGNOSIS — L89214 Pressure ulcer of right hip, stage 4: Secondary | ICD-10-CM | POA: Diagnosis not present

## 2023-08-12 DIAGNOSIS — N3289 Other specified disorders of bladder: Secondary | ICD-10-CM | POA: Diagnosis not present

## 2023-08-12 DIAGNOSIS — D649 Anemia, unspecified: Secondary | ICD-10-CM | POA: Diagnosis not present

## 2023-08-12 DIAGNOSIS — L89323 Pressure ulcer of left buttock, stage 3: Secondary | ICD-10-CM | POA: Diagnosis not present

## 2023-08-12 DIAGNOSIS — L89313 Pressure ulcer of right buttock, stage 3: Secondary | ICD-10-CM | POA: Diagnosis not present

## 2023-08-13 ENCOUNTER — Telehealth: Payer: Self-pay | Admitting: Physician Assistant

## 2023-08-13 LAB — CYTOLOGY - PAP
Adequacy: ABSENT
Comment: NEGATIVE
Diagnosis: NEGATIVE
High risk HPV: NEGATIVE

## 2023-08-14 NOTE — Telephone Encounter (Signed)
 Called patient to inform pap result d/t no Mychart access

## 2023-08-15 ENCOUNTER — Other Ambulatory Visit: Payer: Self-pay | Admitting: Family

## 2023-08-15 DIAGNOSIS — G6289 Other specified polyneuropathies: Secondary | ICD-10-CM

## 2023-08-16 ENCOUNTER — Other Ambulatory Visit: Payer: Self-pay | Admitting: Podiatry

## 2023-08-23 DIAGNOSIS — L89324 Pressure ulcer of left buttock, stage 4: Secondary | ICD-10-CM | POA: Diagnosis not present

## 2023-08-26 DIAGNOSIS — N319 Neuromuscular dysfunction of bladder, unspecified: Secondary | ICD-10-CM | POA: Diagnosis not present

## 2023-09-09 DIAGNOSIS — L89224 Pressure ulcer of left hip, stage 4: Secondary | ICD-10-CM | POA: Diagnosis not present

## 2023-09-09 DIAGNOSIS — F17210 Nicotine dependence, cigarettes, uncomplicated: Secondary | ICD-10-CM | POA: Diagnosis not present

## 2023-09-09 DIAGNOSIS — L89319 Pressure ulcer of right buttock, unspecified stage: Secondary | ICD-10-CM | POA: Diagnosis not present

## 2023-09-09 DIAGNOSIS — L989 Disorder of the skin and subcutaneous tissue, unspecified: Secondary | ICD-10-CM | POA: Diagnosis not present

## 2023-09-09 DIAGNOSIS — G8220 Paraplegia, unspecified: Secondary | ICD-10-CM | POA: Diagnosis not present

## 2023-09-09 DIAGNOSIS — L89324 Pressure ulcer of left buttock, stage 4: Secondary | ICD-10-CM | POA: Diagnosis not present

## 2023-09-09 DIAGNOSIS — L89214 Pressure ulcer of right hip, stage 4: Secondary | ICD-10-CM | POA: Diagnosis not present

## 2023-09-09 DIAGNOSIS — E46 Unspecified protein-calorie malnutrition: Secondary | ICD-10-CM | POA: Diagnosis not present

## 2023-09-09 DIAGNOSIS — D649 Anemia, unspecified: Secondary | ICD-10-CM | POA: Diagnosis not present

## 2023-09-09 DIAGNOSIS — Z79899 Other long term (current) drug therapy: Secondary | ICD-10-CM | POA: Diagnosis not present

## 2023-09-15 DIAGNOSIS — L89324 Pressure ulcer of left buttock, stage 4: Secondary | ICD-10-CM | POA: Diagnosis not present

## 2023-09-25 ENCOUNTER — Other Ambulatory Visit: Payer: Self-pay | Admitting: Family

## 2023-09-25 DIAGNOSIS — G6289 Other specified polyneuropathies: Secondary | ICD-10-CM

## 2023-09-27 ENCOUNTER — Other Ambulatory Visit: Payer: Self-pay | Admitting: Family

## 2023-09-27 ENCOUNTER — Encounter: Payer: Self-pay | Admitting: Family

## 2023-09-27 DIAGNOSIS — G6289 Other specified polyneuropathies: Secondary | ICD-10-CM

## 2023-09-27 NOTE — Telephone Encounter (Signed)
 Copied from CRM [id number]. Topic: Clinical - Medication Refill >> Sep 27, 2023 11:55 AM Avram MATSU wrote: Medication: abapentin (NEURONTIN ) 300 MG capsule [[id number]]  Has the patient contacted their pharmacy? Yes (Agent: If no, request that the patient contact the pharmacy for the refill. If patient does not wish to contact the pharmacy document the reason why and proceed with request.) (Agent: If yes, when and what did the pharmacy advise?)  This is the patient's preferred pharmacy:  [facility] - [place], [place] - [address] [place] [address] Phone: [phone] Fax: [phone]  Is this the correct pharmacy for this prescription? Yes If no, delete pharmacy and type the correct one.   Has the prescription been filled recently? No  Is the patient out of the medication? Yes  Has the patient been seen for an appointment in the last year OR does the patient have an upcoming appointment? Yes  Can we respond through MyChart? Yes  Agent: Please be advised that Rx refills may take up to 3 business days. We ask that you follow-up with your pharmacy.

## 2023-09-27 NOTE — Telephone Encounter (Signed)
 Christy pt NTBS 30-d given 08/16/23

## 2023-09-27 NOTE — Telephone Encounter (Signed)
 Called to schedule appt no answer no voicemail Letter mailed

## 2023-09-29 ENCOUNTER — Other Ambulatory Visit: Payer: Self-pay | Admitting: Family

## 2023-09-29 DIAGNOSIS — G6289 Other specified polyneuropathies: Secondary | ICD-10-CM

## 2023-09-30 ENCOUNTER — Telehealth: Payer: Self-pay

## 2023-09-30 DIAGNOSIS — L89214 Pressure ulcer of right hip, stage 4: Secondary | ICD-10-CM | POA: Diagnosis not present

## 2023-09-30 DIAGNOSIS — F17210 Nicotine dependence, cigarettes, uncomplicated: Secondary | ICD-10-CM | POA: Diagnosis not present

## 2023-09-30 DIAGNOSIS — G8220 Paraplegia, unspecified: Secondary | ICD-10-CM | POA: Diagnosis not present

## 2023-09-30 DIAGNOSIS — E46 Unspecified protein-calorie malnutrition: Secondary | ICD-10-CM | POA: Diagnosis not present

## 2023-09-30 DIAGNOSIS — L989 Disorder of the skin and subcutaneous tissue, unspecified: Secondary | ICD-10-CM | POA: Diagnosis not present

## 2023-09-30 DIAGNOSIS — L89319 Pressure ulcer of right buttock, unspecified stage: Secondary | ICD-10-CM | POA: Diagnosis not present

## 2023-09-30 DIAGNOSIS — Z79899 Other long term (current) drug therapy: Secondary | ICD-10-CM | POA: Diagnosis not present

## 2023-09-30 DIAGNOSIS — L89224 Pressure ulcer of left hip, stage 4: Secondary | ICD-10-CM | POA: Diagnosis not present

## 2023-09-30 DIAGNOSIS — D649 Anemia, unspecified: Secondary | ICD-10-CM | POA: Diagnosis not present

## 2023-09-30 DIAGNOSIS — L89324 Pressure ulcer of left buttock, stage 4: Secondary | ICD-10-CM | POA: Diagnosis not present

## 2023-09-30 NOTE — Telephone Encounter (Signed)
 Ok to do video visit?

## 2023-09-30 NOTE — Telephone Encounter (Signed)
 Copied from CRM [id number]. Topic: Clinical - Medication Refill >> Sep 27, 2023 11:55 AM Avram MATSU wrote: Medication: abapentin (NEURONTIN ) 300 MG capsule [[id number]]  Has the patient contacted their pharmacy? Yes (Agent: If no, request that the patient contact the pharmacy for the refill. If patient does not wish to contact the pharmacy document the reason why and proceed with request.) (Agent: If yes, when and what did the pharmacy advise?)  This is the patient's preferred pharmacy:  [facility] - [place], [place] - [address] [place] [address] Phone: [phone] Fax: [phone]  Is this the correct pharmacy for this prescription? Yes If no, delete pharmacy and type the correct one.   Has the prescription been filled recently? No  Is the patient out of the medication? Yes  Has the patient been seen for an appointment in the last year OR does the patient have an upcoming appointment? Yes  Can we respond through MyChart? Yes  Agent: Please be advised that Rx refills may take up to 3 business days. We ask that you follow-up with your pharmacy. >> Sep 30, 2023 10:10 AM Zane F wrote: Patient is actively bed ridden and cannot come into the office due to PCP schedule and limited mobility concerns. Patient is requesting that the prescription be filled until her next appointment. Patient is experiencing spasms in her left leg below her knee. Patient cannot feel her feet either. Patient has been without the medication since 09/26/2023.

## 2023-09-30 NOTE — Telephone Encounter (Signed)
 Kim [name], are you alright to do a video visit since she is bed ridden? You have one opening in the am around 10

## 2023-10-01 ENCOUNTER — Encounter: Payer: Self-pay | Admitting: Family

## 2023-10-01 ENCOUNTER — Telehealth (INDEPENDENT_AMBULATORY_CARE_PROVIDER_SITE_OTHER): Admitting: Family

## 2023-10-01 DIAGNOSIS — N319 Neuromuscular dysfunction of bladder, unspecified: Secondary | ICD-10-CM | POA: Diagnosis not present

## 2023-10-01 DIAGNOSIS — S343XXS Injury of cauda equina, sequela: Secondary | ICD-10-CM | POA: Diagnosis not present

## 2023-10-01 DIAGNOSIS — F17200 Nicotine dependence, unspecified, uncomplicated: Secondary | ICD-10-CM

## 2023-10-01 DIAGNOSIS — E43 Unspecified severe protein-calorie malnutrition: Secondary | ICD-10-CM

## 2023-10-01 DIAGNOSIS — G6289 Other specified polyneuropathies: Secondary | ICD-10-CM

## 2023-10-01 DIAGNOSIS — F411 Generalized anxiety disorder: Secondary | ICD-10-CM | POA: Diagnosis not present

## 2023-10-01 DIAGNOSIS — S343XXD Injury of cauda equina, subsequent encounter: Secondary | ICD-10-CM | POA: Diagnosis not present

## 2023-10-01 DIAGNOSIS — E559 Vitamin D deficiency, unspecified: Secondary | ICD-10-CM | POA: Diagnosis not present

## 2023-10-01 MED ORDER — GABAPENTIN 300 MG PO CAPS
ORAL_CAPSULE | ORAL | 3 refills | Status: DC
Start: 2023-10-01 — End: 2024-01-28

## 2023-10-01 NOTE — Progress Notes (Signed)
 Virtual Visit Consent   Kim [name], you are scheduled for a virtual visit with a [facility] provider today. Just as with appointments in the office, your consent must be obtained to participate. Your consent will be active for this visit and any virtual visit you may have with one of our providers in the next 365 days. If you have a MyChart account, a copy of this consent can be sent to you electronically.  As this is a virtual visit, video technology does not allow for your provider to perform a traditional examination. This may limit your provider's ability to fully assess your condition. If your provider identifies any concerns that need to be evaluated in person or the need to arrange testing (such as labs, EKG, etc.), we will make arrangements to do so. Although advances in technology are sophisticated, we cannot ensure that it will always work on either your end or our end. If the connection with a video visit is poor, the visit may have to be switched to a telephone visit. With either a video or telephone visit, we are not always able to ensure that we have a secure connection.  By engaging in this virtual visit, you consent to the provision of healthcare and authorize for your insurance to be billed (if applicable) for the services provided during this visit. Depending on your insurance coverage, you may receive a charge related to this service.  I need to obtain your verbal consent now. Are you willing to proceed with your visit today? Kim [name] has provided verbal consent on 10/01/2023 for a virtual visit (video or telephone). Bari Learn, FNP  Date: 10/01/2023 10:34 AM   Virtual Visit via Video Note   I, Bari Learn, connected with  Kim [name]  ([id number], 1975/10/05) on 10/01/23 at 10:25 AM EDT by a video-enabled telemedicine application and verified that I am speaking with the correct person using two identifiers.  Location: Patient: Virtual Visit Location  Patient: Home Provider: Virtual Visit Location Provider: Home Office   I discussed the limitations of evaluation and management by telemedicine and the availability of in person appointments. The patient expressed understanding and agreed to proceed.    History of Present Illness: Kim [name] is a 47 y.o. who identifies as a female who was assigned female at birth, and is being seen today for She has cauda equina spinal cord injury and multiple pressure ulcers. She had a wound vac right buttocks and left buttock and is followed by wound care every three weeks. She was having home health nurse coming M,W, F, but having trouble finding an agency to come change her wound vac. She currently does not have a wound vac and doing silver  AG.    She has an aid 11 hours a day to help with daily activities since she is bed bound. She is suppose to be out of bed only a hour a day.   She has neurogenic bladder and has a foley cathter.She is followed by Urologists monthly to have this changed.   She is smoking < 1 pack a day.   She has neuropathy constant burning pain in left lower leg that is a 6-7 out 10.   HPI: Depression        This is a chronic problem.  The current episode started more than 1 year ago.   The onset quality is gradual.   The problem occurs intermittently.  Associated symptoms include fatigue, restlessness and sad.  Associated  symptoms include no helplessness and no hopelessness.  Past medical history includes anxiety.   Nicotine  Dependence Presents for follow-up visit. Symptoms include fatigue. She smokes 1 pack of cigarettes per day.  Anxiety Presents for follow-up visit. Symptoms include depressed mood, excessive worry, nervous/anxious behavior and restlessness. Symptoms occur occasionally. The severity of symptoms is moderate.      Problems:  Patient Active Problem List   Diagnosis Date Noted   Hypotension 05/19/2022   Peripheral neuropathy 05/19/2022   Osteomyelitis of  right femur (HCC) 07/09/2021   Malnutrition of moderate degree 04/16/2021   Chronic osteomyelitis of left foot with draining sinus (HCC)    Severe protein-calorie malnutrition (HCC) 04/08/2021   Pressure injury of left hip, stage 3 (HCC) 03/04/2021   Pressure injury of contiguous region involving right buttock and hip, stage 4 (HCC) 02/10/2021   Pressure injury of left heel, stage 4 (HCC) 02/10/2021   Pressure injury of skin of right hip 01/28/2021   Non-healing open wound of heel, initial encounter 04/12/2020   At risk for falls 02/09/2018   Post-menopausal osteoporosis 02/09/2018   Loosening of prosthetic hip (HCC) 06/01/2016   Enlarged uterus 03/03/2016   Postmenopausal 03/03/2016   GAD (generalized anxiety disorder) 01/21/2016   Dislocation of right hip (HCC) 08/01/2015   Status post right hip replacement 07/24/2015   Neurogenic bladder 06/28/2015   Current smoker 05/13/2015   Height loss 05/13/2015   Wheelchair dependent 05/13/2015   History of developmental dysplasia of the hip 04/29/2015   Primary osteoarthritis of knees, bilateral 04/29/2015   Osteopenia    Vitamin D  deficiency 05/01/2014   Heterotopic ossification of bone 08/02/2013   Pain of right hip joint 08/02/2013   Cauda equina injury without spinal bone injury (HCC) 05/26/2011   Low back pain 05/26/2011   Osteoarthritis of knee 03/23/2011   Osteoarthritis of thoracolumbar spine 03/23/2011   Left knee pain 02/08/2011   KNEE PAIN 05/23/2007   SPONDYLOSIS WITH MYELOPATHY LUMBAR REGION 05/23/2007    Allergies:  Allergies  Allergen Reactions   Keflex  [Cephalexin ] Diarrhea   Medications:  Current Outpatient Medications:    acetaminophen  (TYLENOL ) 650 MG suppository, Place 650 mg rectally every 4 (four) hours as needed., Disp: , Rfl:    Ascorbic Acid  (VITAMIN C ) 500 MG CAPS, Take 500 mg by mouth daily., Disp: 90 capsule, Rfl: 11   baclofen  (LIORESAL ) 10 MG tablet, Take 1 tablet (10 mg total) by mouth 3 (three)  times daily., Disp: 60 each, Rfl: 3   Buprenorphine  HCl-Naloxone  HCl 8-2 MG FILM, Place 1 Film under the tongue 3 (three) times daily., Disp: , Rfl:    buPROPion  (WELLBUTRIN  SR) 200 MG 12 hr tablet, Take 1 tablet (200 mg total) by mouth 2 (two) times daily., Disp: 180 tablet, Rfl: 1   CALCIUM-VITAMIN D  PO, Take 1 tablet by mouth daily., Disp: , Rfl:    clonazePAM  (KLONOPIN ) 0.5 MG tablet, Take 0.5 mg by mouth 2 (two) times daily., Disp: , Rfl:    clotrimazole -betamethasone  (LOTRISONE ) cream, Apply 1 Application topically daily., Disp: 30 g, Rfl: 2   collagenase  (SANTYL ) ointment, Apply topically daily., Disp: 30 g, Rfl: 0   Elastic Bandages & Supports (HEEL/ANKLE PROTECTOR) MISC, 1 each by Does not apply route at bedtime., Disp: 2 each, Rfl: 1   ferrous sulfate  325 (65 FE) MG tablet, Take 1 tablet (325 mg total) by mouth 2 (two) times daily with a meal., Disp: 60 tablet, Rfl: 5   folic acid  (FOLVITE ) 1 MG tablet, Take  1 tablet (1 mg total) by mouth daily., Disp: 30 tablet, Rfl: 5   Foot Care Products (SOCK AID) MISC, 1 Device by Does not apply route daily., Disp: 1 each, Rfl: 0   gabapentin  (NEURONTIN ) 300 MG capsule, TAKE 2 CAPSULES BY MOUTH 2 TIMES A DAY **NEEDS TO BE SEEN BEFORE NEXT REFILL**, Disp: 120 capsule, Rfl: 3   midodrine  (PROAMATINE ) 10 MG tablet, Take 1 tablet (10 mg total) by mouth 3 (three) times daily with meals., Disp: 90 tablet, Rfl: 2   mirabegron  ER (MYRBETRIQ ) 25 MG TB24 tablet, 25 mg., Disp: , Rfl:    mirtazapine  (REMERON ) 7.5 MG tablet, Take 1 tablet (7.5 mg total) by mouth at bedtime., Disp: 90 tablet, Rfl: 1   NARCAN  4 MG/0.1ML LIQD nasal spray kit, , Disp: , Rfl:    Nutritional Supplements (ENSURE HIGH PROTEIN) LIQD, Take 237 mLs by mouth 4 (four) times daily - after meals and at bedtime., Disp: 3792 mL, Rfl: 11   Nutritional Supplements (JUVEN NUTRIVIGOR) PACK, Take 1 each by mouth 3 (three) times daily., Disp: 180 each, Rfl: 3   Nutritional Supplements (PROMOD) LIQD,  Take 30 mLs by mouth 2 (two) times daily., Disp: 946 mL, Rfl: 11   nystatin  (MYCOSTATIN /NYSTOP ) powder, Apply 1 Application topically 3 (three) times daily., Disp: 60 g, Rfl: 2   OLANZapine (ZYPREXA) 15 MG tablet, Take 15 mg by mouth at bedtime., Disp: , Rfl:    Plecanatide  (TRULANCE ) 3 MG TABS, Take 3 mg by mouth daily., Disp: 90 tablet, Rfl: 1   polyethylene glycol powder (GLYCOLAX /MIRALAX ) 17 GM/SCOOP powder, Take 17 g by mouth daily., Disp: 3350 g, Rfl: 1   POTASSIUM PO, Take 50 mg by mouth daily., Disp: , Rfl:    Zinc  50 MG CAPS, Take 1 capsule (50 mg total) by mouth daily., Disp: 90 capsule, Rfl: 1  Observations/Objective: Patient is well-developed, well-nourished in no acute distress.  Resting comfortably  at home.  Head is normocephalic, atraumatic.  No labored breathing.  Speech is clear and coherent with logical content.  Patient is alert and oriented at baseline.  Laying in bed In good spirits at this time.   Assessment and Plan: 1. Cauda equina injury without spinal bone injury, sequela (Primary)  2. GAD (generalized anxiety disorder)  3. Other polyneuropathy - gabapentin  (NEURONTIN ) 300 MG capsule; TAKE 2 CAPSULES BY MOUTH 2 TIMES A DAY **NEEDS TO BE SEEN BEFORE NEXT REFILL**  Dispense: 120 capsule; Refill: 3  4. Vitamin D  deficiency  5. Severe protein-calorie malnutrition (HCC)  6. Current smoker  7. Neurogenic bladder   Continue current medications  Keep all follow up with specialists  Continue with wound care and keeping wound clean and dry Smoking cessation discussed Follow up in 3 months   Follow Up Instructions: I discussed the assessment and treatment plan with the patient. The patient was provided an opportunity to ask questions and all were answered. The patient agreed with the plan and demonstrated an understanding of the instructions.  A copy of instructions were sent to the patient via MyChart unless otherwise noted below.     The patient was  advised to call back or seek an in-person evaluation if the symptoms worsen or if the condition fails to improve as anticipated.    Bari Learn, FNP

## 2023-10-01 NOTE — Telephone Encounter (Signed)
 Appointment made

## 2023-10-21 DIAGNOSIS — L89224 Pressure ulcer of left hip, stage 4: Secondary | ICD-10-CM | POA: Diagnosis not present

## 2023-10-21 DIAGNOSIS — Z9889 Other specified postprocedural states: Secondary | ICD-10-CM | POA: Diagnosis not present

## 2023-10-21 DIAGNOSIS — L89214 Pressure ulcer of right hip, stage 4: Secondary | ICD-10-CM | POA: Diagnosis not present

## 2023-10-21 DIAGNOSIS — D649 Anemia, unspecified: Secondary | ICD-10-CM | POA: Diagnosis not present

## 2023-10-21 DIAGNOSIS — L89313 Pressure ulcer of right buttock, stage 3: Secondary | ICD-10-CM | POA: Diagnosis not present

## 2023-10-21 DIAGNOSIS — E46 Unspecified protein-calorie malnutrition: Secondary | ICD-10-CM | POA: Diagnosis not present

## 2023-10-21 DIAGNOSIS — N3289 Other specified disorders of bladder: Secondary | ICD-10-CM | POA: Diagnosis not present

## 2023-10-21 DIAGNOSIS — L89323 Pressure ulcer of left buttock, stage 3: Secondary | ICD-10-CM | POA: Diagnosis not present

## 2023-10-21 DIAGNOSIS — F17210 Nicotine dependence, cigarettes, uncomplicated: Secondary | ICD-10-CM | POA: Diagnosis not present

## 2023-10-21 DIAGNOSIS — L89324 Pressure ulcer of left buttock, stage 4: Secondary | ICD-10-CM | POA: Diagnosis not present

## 2023-10-26 ENCOUNTER — Ambulatory Visit

## 2023-10-26 ENCOUNTER — Other Ambulatory Visit: Payer: Self-pay | Admitting: Family

## 2023-10-26 VITALS — BP 124/83 | HR 84 | Ht 66.0 in

## 2023-10-26 DIAGNOSIS — Z0000 Encounter for general adult medical examination without abnormal findings: Secondary | ICD-10-CM | POA: Diagnosis not present

## 2023-10-26 DIAGNOSIS — M62838 Other muscle spasm: Secondary | ICD-10-CM

## 2023-10-26 DIAGNOSIS — M25551 Pain in right hip: Secondary | ICD-10-CM

## 2023-10-26 NOTE — Patient Instructions (Signed)
 Kim [name] , Thank you for taking time out of your busy schedule to complete your Annual Wellness Visit with me. I enjoyed our conversation and look forward to speaking with you again next year. I, as well as your care team,  appreciate your ongoing commitment to your health goals. Please review the following plan we discussed and let me know if I can assist you in the future. Your Game plan/ To Do List    Follow up Visits: Next Medicare AWV with our clinical staff: 10/26/24 at 11:20a.m.   Next Office Visit with your provider: n/a  Clinician Recommendations:  Aim for 30 minutes of exercise or brisk walking, 6-8 glasses of water, and 5 servings of fruits and vegetables each day.       This is a list of the screening recommended for you and due dates:  Health Maintenance  Topic Date Due   COVID-19 Vaccine (1) Never done   Pneumococcal Vaccination (1 of 2 - PCV) Never done   Hepatitis B Vaccine (1 of 3 - 19+ 3-dose series) Never done   HPV Vaccine (1 - 3-dose SCDM series) Never done   Mammogram  12/31/2011   DEXA scan (bone density measurement)  05/20/2017   Colon Cancer Screening  Never done   Medicare Annual Wellness Visit  05/21/2023   Flu Shot  11/05/2023   DTaP/Tdap/Td vaccine (2 - Td or Tdap) 05/01/2024   Pap with HPV screening  08/08/2028   Hepatitis C Screening  Completed   HIV Screening  Completed   Meningitis B Vaccine  Aged Out    Advanced directives: (Declined) Advance directive discussed with you today. Even though you declined this today, please call our office should you change your mind, and we can give you the proper paperwork for you to fill out. Advance Care Planning is important because it:  [x]  Makes sure you receive the medical care that is consistent with your values, goals, and preferences  [x]  It provides guidance to your family and loved ones and reduces their decisional burden about whether or not they are making the right decisions based on your  wishes.  Follow the link provided in your after visit summary or read over the paperwork we have mailed to you to help you started getting your Advance Directives in place. If you need assistance in completing these, please reach out to us  so that we can help you!  See attachments for Preventive Care and Fall Prevention Tips.
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
## 2023-10-27 DIAGNOSIS — N319 Neuromuscular dysfunction of bladder, unspecified: Secondary | ICD-10-CM | POA: Diagnosis not present

## 2023-11-13 ENCOUNTER — Other Ambulatory Visit: Payer: Self-pay | Admitting: Family

## 2023-11-13 DIAGNOSIS — Z716 Tobacco abuse counseling: Secondary | ICD-10-CM

## 2023-11-13 DIAGNOSIS — F320 Major depressive disorder, single episode, mild: Secondary | ICD-10-CM

## 2023-11-13 DIAGNOSIS — F17200 Nicotine dependence, unspecified, uncomplicated: Secondary | ICD-10-CM

## 2023-11-15 ENCOUNTER — Encounter: Payer: Self-pay | Admitting: Family

## 2023-11-15 NOTE — Telephone Encounter (Signed)
 [facility] NTBS for 3 mos FU in Sept RF sent to pharmacy

## 2023-11-15 NOTE — Telephone Encounter (Signed)
 NO ANSWER TO MAKE APPOINTMENT. WILL MAIL LETTER

## 2023-11-18 DIAGNOSIS — Z87440 Personal history of urinary (tract) infections: Secondary | ICD-10-CM | POA: Diagnosis not present

## 2023-11-18 DIAGNOSIS — L89313 Pressure ulcer of right buttock, stage 3: Secondary | ICD-10-CM | POA: Diagnosis not present

## 2023-11-18 DIAGNOSIS — L89323 Pressure ulcer of left buttock, stage 3: Secondary | ICD-10-CM | POA: Diagnosis not present

## 2023-11-18 DIAGNOSIS — L89314 Pressure ulcer of right buttock, stage 4: Secondary | ICD-10-CM | POA: Diagnosis not present

## 2023-11-18 DIAGNOSIS — F17210 Nicotine dependence, cigarettes, uncomplicated: Secondary | ICD-10-CM | POA: Diagnosis not present

## 2023-11-18 DIAGNOSIS — G8220 Paraplegia, unspecified: Secondary | ICD-10-CM | POA: Diagnosis not present

## 2023-11-18 DIAGNOSIS — Z720 Tobacco use: Secondary | ICD-10-CM | POA: Diagnosis not present

## 2023-12-09 DIAGNOSIS — L89214 Pressure ulcer of right hip, stage 4: Secondary | ICD-10-CM | POA: Diagnosis not present

## 2023-12-09 DIAGNOSIS — L89324 Pressure ulcer of left buttock, stage 4: Secondary | ICD-10-CM | POA: Diagnosis not present

## 2023-12-09 DIAGNOSIS — M869 Osteomyelitis, unspecified: Secondary | ICD-10-CM | POA: Diagnosis not present

## 2023-12-09 DIAGNOSIS — I96 Gangrene, not elsewhere classified: Secondary | ICD-10-CM | POA: Diagnosis not present

## 2023-12-09 DIAGNOSIS — F17210 Nicotine dependence, cigarettes, uncomplicated: Secondary | ICD-10-CM | POA: Diagnosis not present

## 2023-12-09 DIAGNOSIS — G8220 Paraplegia, unspecified: Secondary | ICD-10-CM | POA: Diagnosis not present

## 2023-12-09 DIAGNOSIS — L89314 Pressure ulcer of right buttock, stage 4: Secondary | ICD-10-CM | POA: Diagnosis not present

## 2023-12-09 DIAGNOSIS — L89224 Pressure ulcer of left hip, stage 4: Secondary | ICD-10-CM | POA: Diagnosis not present

## 2023-12-20 DIAGNOSIS — L89324 Pressure ulcer of left buttock, stage 4: Secondary | ICD-10-CM | POA: Diagnosis not present

## 2023-12-21 DIAGNOSIS — L89324 Pressure ulcer of left buttock, stage 4: Secondary | ICD-10-CM | POA: Diagnosis not present

## 2024-01-03 ENCOUNTER — Ambulatory Visit: Admitting: Nurse Practitioner

## 2024-01-13 DIAGNOSIS — L89224 Pressure ulcer of left hip, stage 4: Secondary | ICD-10-CM | POA: Diagnosis not present

## 2024-01-13 DIAGNOSIS — G8220 Paraplegia, unspecified: Secondary | ICD-10-CM | POA: Diagnosis not present

## 2024-01-13 DIAGNOSIS — L89214 Pressure ulcer of right hip, stage 4: Secondary | ICD-10-CM | POA: Diagnosis not present

## 2024-01-13 DIAGNOSIS — F17200 Nicotine dependence, unspecified, uncomplicated: Secondary | ICD-10-CM | POA: Diagnosis not present

## 2024-01-13 DIAGNOSIS — L97511 Non-pressure chronic ulcer of other part of right foot limited to breakdown of skin: Secondary | ICD-10-CM | POA: Diagnosis not present

## 2024-01-19 DIAGNOSIS — L89324 Pressure ulcer of left buttock, stage 4: Secondary | ICD-10-CM | POA: Diagnosis not present

## 2024-01-25 ENCOUNTER — Ambulatory Visit: Admitting: Family

## 2024-01-28 ENCOUNTER — Other Ambulatory Visit: Payer: Self-pay | Admitting: Family

## 2024-01-28 DIAGNOSIS — G6289 Other specified polyneuropathies: Secondary | ICD-10-CM

## 2024-02-04 ENCOUNTER — Ambulatory Visit: Admitting: Family

## 2024-02-04 ENCOUNTER — Encounter: Payer: Self-pay | Admitting: Family

## 2024-02-04 VITALS — BP 106/98 | HR 86 | Temp 98.4°F

## 2024-02-04 DIAGNOSIS — M79601 Pain in right arm: Secondary | ICD-10-CM | POA: Diagnosis not present

## 2024-02-04 DIAGNOSIS — S343XXS Injury of cauda equina, sequela: Secondary | ICD-10-CM

## 2024-02-04 DIAGNOSIS — F17200 Nicotine dependence, unspecified, uncomplicated: Secondary | ICD-10-CM

## 2024-02-04 DIAGNOSIS — N319 Neuromuscular dysfunction of bladder, unspecified: Secondary | ICD-10-CM | POA: Diagnosis not present

## 2024-02-04 DIAGNOSIS — Z716 Tobacco abuse counseling: Secondary | ICD-10-CM

## 2024-02-04 DIAGNOSIS — Z23 Encounter for immunization: Secondary | ICD-10-CM | POA: Diagnosis not present

## 2024-02-04 DIAGNOSIS — L89219 Pressure ulcer of right hip, unspecified stage: Secondary | ICD-10-CM | POA: Diagnosis not present

## 2024-02-04 MED ORDER — VARENICLINE TARTRATE (STARTER) 0.5 MG X 11 & 1 MG X 42 PO TBPK: ORAL_TABLET | ORAL | 0 refills | Status: DC

## 2024-02-04 MED ORDER — VARENICLINE TARTRATE 1 MG PO TABS: 1.0000 mg | ORAL_TABLET | Freq: Two times a day (BID) | ORAL | 2 refills | Status: AC

## 2024-02-04 NOTE — Progress Notes (Signed)
 Subjective:    Patient ID: Kim [name], female    DOB: 11-16-1975, 48 y.o.   MRN: [id number]  Chief Complaint  Patient presents with   arm discomfort    Right foot   Pt presents to the office today with right foot pain. Reports she hit her foot against the wall and wheelchair.   She has cauda equina injury and wheelchair dependent.   She is complaining of right arm, shoulder, and back pain.   She is doing arm stretching and exercises daily. She has had PT in the past, but not in the last year.   She has two pressure ulcers on right her hip. Reports both areas are scabbed and doing much better. Followed by Wound Care every 3 weeks.   She has foley and followed by Urologists monthly for neurogenic bladder.  Shoulder Pain  This is a chronic problem. The current episode started more than 1 year ago. The problem occurs intermittently.  Arm Pain  The incident occurred more than 1 week ago. There was no injury mechanism. Pain location: right arm. The quality of the pain is described as aching. The pain is at a severity of 7/10. The pain is moderate. The pain has been Intermittent since the incident. She has tried rest for the symptoms. The treatment provided mild relief.  Nicotine  Dependence Presents for follow-up visit. Symptoms include fatigue. Her urge triggers include company of smokers. The symptoms have been stable. She smokes 1 pack of cigarettes per day.      Review of Systems  Constitutional:  Positive for fatigue.  Musculoskeletal:  Positive for arthralgias, back pain and myalgias.  Skin:  Positive for color change and wound.  [name] other systems reviewed and are negative.   Social History   Socioeconomic History   Marital status: Single    Spouse name: Not on file   Number of children: 2   Years of education: Not on file   Highest education level: Not on file  Occupational History   Occupation: disability  Tobacco Use   Smoking status: Every Day    Current  packs/day: 0.50    Average packs/day: 0.5 packs/day for 20.0 years (10.0 ttl pk-yrs)    Types: Cigarettes   Smokeless tobacco: Never   Tobacco comments:    States cutting back  Vaping Use   Vaping status: Never Used  Substance and Sexual Activity   Alcohol  use: No   Drug use: Yes    Types: Marijuana    Comment: occasionally   Sexual activity: Not Currently    Birth control/protection: Post-menopausal  Other Topics Concern   Not on file  Social History Narrative   05/19/2021 - Her 2 sons live with her - age 58 and 60   05/19/2021 - she has PT, OT, RN HH twice per week and aide with her daily   Social Drivers of Health   Financial Resource Strain: Low Risk  (08/09/2023)   Overall Financial Resource Strain (CARDIA)    Difficulty of Paying Living Expenses: Not very hard  Food Insecurity: No Food Insecurity (10/26/2023)   Hunger Vital Sign    Worried About Running Out of Food in the Last Year: Never true    Ran Out of Food in the Last Year: Never true  Transportation Needs: No Transportation Needs (10/26/2023)   PRAPARE - Administrator, Civil Service (Medical): No    Lack of Transportation (Non-Medical): No  Physical Activity: Insufficiently Active (10/26/2023)  Exercise Vital Sign    Days of Exercise per Week: 3 days    Minutes of Exercise per Session: 20 min  Stress: Stress Concern Present (10/26/2023)   [employer] of Occupational Health - Occupational Stress Questionnaire    Feeling of Stress: To some extent  Social Connections: Moderately Isolated (10/26/2023)   Social Connection and Isolation Panel    Frequency of Communication with Friends and Family: More than three times a week    Frequency of Social Gatherings with Friends and Family: More than three times a week    Attends Religious Services: More than 4 times per year    Active Member of [employer] or Organizations: No    Attends Banker Meetings: Never    Marital Status: Never married    Family History  Problem Relation Age of Onset   Cancer Mother    Rheum arthritis Mother    Diabetes Mother    Mental illness Mother    Mental illness Father    Cancer Other    Diabetes Other    Heart attack Maternal Grandmother    Cancer Maternal Grandmother    ADD / ADHD Son    SIDS Son         Objective:   Physical Exam Vitals reviewed.  Constitutional:      General: She is not in acute distress.    Appearance: She is well-developed.  HENT:     Head: Normocephalic and atraumatic.     Right Ear: Tympanic membrane normal.     Left Ear: Tympanic membrane normal.  Eyes:     Pupils: Pupils are equal, round, and reactive to light.  Neck:     Thyroid : No thyromegaly.  Cardiovascular:     Rate and Rhythm: Normal rate and regular rhythm.     Heart sounds: Normal heart sounds. No murmur heard. Pulmonary:     Effort: Pulmonary effort is normal. No respiratory distress.     Breath sounds: Normal breath sounds. No wheezing.  Abdominal:     General: Bowel sounds are normal. There is no distension.     Palpations: Abdomen is soft.     Tenderness: There is no abdominal tenderness.  Musculoskeletal:        General: No tenderness.     Cervical back: Normal range of motion and neck supple.     Comments: Pain in right arm with flexion and extension  Skin:    General: Skin is warm and dry.  Neurological:     Mental Status: She is alert and oriented to person, place, and time.     Cranial Nerves: No cranial nerve deficit.     Motor: Weakness present.     Coordination: Coordination abnormal.     Deep Tendon Reflexes: Reflexes are normal and symmetric.     Comments: Unable to flex or extend BLE , warm to touch, discoloration present, pt wheelchair bound. Unable to rotate right hip  Psychiatric:        Behavior: Behavior normal.        Thought Content: Thought content normal.        Judgment: Judgment normal.       BP (!) 106/98   Pulse 86   Temp 98.4 F (36.9 C)   LMP  11/24/2012   SpO2 98%      Assessment & Plan:  Kim [name] comes in today with chief complaint of arm discomfort (Right foot)   Diagnosis and orders addressed:  1. Right  arm pain (Primary) - Ambulatory referral to Home Health  2. Pressure injury of skin of right hip, unspecified injury stage - Ambulatory referral to Home Health  3. Neurogenic bladder  4. Current smoker   5. Cauda equina injury without spinal bone injury, sequela  6. Encounter for smoking cessation counseling - Start Chantix  Discussed how to take medication Discussed black box warning, stop medication with any worsening depression or SI - Varenicline Tartrate, Starter, (CHANTIX STARTING MONTH PAK) 0.5 MG X 11 & 1 MG X 42 TBPK; 0.5 mg PO qd X3 days, then 0.5 mg PO BID for 4. Then 1 mg BID.  Dispense: 53 each; Refill: 0 - varenicline (CHANTIX CONTINUING MONTH PAK) 1 MG tablet; Take 1 tablet (1 mg total) by mouth 2 (two) times daily.  Dispense: 60 tablet; Refill: 2   Referral to PT Continue current medications  Keep follow up with specialists  Health Maintenance reviewed Diet and exercise encouraged Keep follow up visit   Bari Learn, FNP

## 2024-02-04 NOTE — Patient Instructions (Signed)
 Varenicline Tablets What is this medication? VARENICLINE (var e NI kleen) helps you quit smoking. It reduces cravings for nicotine, the addictive substance found in tobacco. It is most effective when used in combination with a stop-smoking program. This medicine may be used for other purposes; ask your health care provider or pharmacist if you have questions. COMMON BRAND NAME(S): Chantix What should I tell my care team before I take this medication? They need to know if you have any of these conditions: Heart disease Frequently drink alcohol Kidney disease Mental health condition On hemodialysis Seizures History of stroke Suicidal thoughts, plans, or attempt by you or a family member An unusual or allergic reaction to varenicline, other medications, foods, dyes, or preservatives Pregnant or trying to get pregnant Breast-feeding How should I use this medication? Take this medication by mouth after eating. Take with a full glass of water. Follow the directions on the prescription label. Take your doses at regular intervals. Do not take your medication more often than directed. There are 3 ways you can use this medication to help you quit smoking; talk to your care team to decide which plan is right for you: 1) you can choose a quit date and start this medication 1 week before the quit date, or, 2) you can start taking this medication before you choose a quit date, and then pick a quit date between day 8 and 35 days of treatment, or, 3) if you are not sure that you are able or willing to quit smoking right away, start taking this medication and slowly decrease the amount you smoke as directed by your care team with the goal of being cigarette-free by week 12 of treatment. Stick to your plan; ask about support groups or other ways to help you remain cigarette-free. If you are motivated to quit smoking and did not succeed during a previous attempt with this medication for reasons other than side  effects, or if you returned to smoking after this treatment, speak with your care team about whether another course of this medication may be right for you. A special MedGuide will be given to you by the pharmacist with each prescription and refill. Be sure to read this information carefully each time. Talk to your care team about the use of this medication in children. This medication is not approved for use in children. Overdosage: If you think you have taken too much of this medicine contact a poison control center or emergency room at once. NOTE: This medicine is only for you. Do not share this medicine with others. What if I miss a dose? If you miss a dose, take it as soon as you can. If it is almost time for your next dose, take only that dose. Do not take double or extra doses. What may interact with this medication? Alcohol Insulin Other medications used to help people quit smoking Theophylline Warfarin This list may not describe all possible interactions. Give your health care provider a list of all the medicines, herbs, non-prescription drugs, or dietary supplements you use. Also tell them if you smoke, drink alcohol, or use illegal drugs. Some items may interact with your medicine. What should I watch for while using this medication? It is okay if you do not succeed at your attempt to quit and have a cigarette. You can still continue your quit attempt and keep using this medication as directed. Just throw away your cigarettes and get back to your quit plan. Talk to your care team  before using other treatments to quit smoking. Using this medication with other treatments to quit smoking may increase the risk for side effects compared to using a treatment alone. This medication may affect your coordination, reaction time, or judgment. Do not drive or operate machinery until you know how this medication affects you. Sit up or stand slowly to reduce the risk of dizzy or fainting  spells. Decrease the number of alcoholic beverages that you drink during treatment with this medication until you know if this medication affects your ability to tolerate alcohol. Some people have experienced increased drunkenness (intoxication), unusual or sometimes aggressive behavior, or no memory of things that have happened (amnesia) during treatment with this medication. You may do unusual sleep behaviors or activities you do not remember the day after taking this medication. Activities include driving, making or eating food, talking on the phone, sexual activity, or sleep walking. Stop taking this medication and call your care team right away if you find out you have done activities like this. Patients and their families should watch out for new or worsening depression or thoughts of suicide. Also watch out for sudden changes in feelings such as feeling anxious, agitated, panicky, irritable, hostile, aggressive, impulsive, severely restless, overly excited and hyperactive, or not being able to sleep. If this happens, call your care team. If you have diabetes, and you quit smoking, the effects of insulin may be increased. You may need to reduce your insulin dose. Check with your care team about how you should adjust your insulin dose. What side effects may I notice from receiving this medication? Side effects that you should report to your care team as soon as possible: Allergic reactions or angioedema--skin rash, itching or hives, swelling of the face, eyes, lips, tongue, arms, or legs, trouble swallowing or breathing Heart attack--pain or tightness in the chest, shoulders, arms, or jaw, nausea, shortness of breath, cold or clammy skin, feeling faint or lightheaded Mood and behavior changes--anxiety, nervousness, confusion, hallucinations, irritability, hostility, thoughts of suicide or self-harm, worsening mood, feelings of depression Redness, blistering, peeling, or loosening of the skin,  including inside the mouth Stroke--sudden numbness or weakness of the face, arm, or leg, trouble speaking, confusion, trouble walking, loss of balance or coordination, dizziness, severe headache, change in vision Seizures Side effects that usually do not require medical attention (report to your care team if they continue or are bothersome): Constipation Drowsiness Gas Nausea Trouble sleeping Upset stomach Vivid dreams or nightmares Vomiting This list may not describe all possible side effects. Call your doctor for medical advice about side effects. You may report side effects to FDA at 1-800-FDA-1088. Where should I keep my medication? Keep out of the reach of children and pets. Store at room temperature between 15 and 30 degrees C (59 and 86 degrees F). Throw away any unused medication after the expiration date. NOTE: This sheet is a summary. It may not cover all possible information. If you have questions about this medicine, talk to your doctor, pharmacist, or health care provider.  2024 Elsevier/Gold Standard (2021-02-13 00:00:00)

## 2024-02-08 NOTE — Telephone Encounter (Unsigned)
 Copied from CRM [id number]. Topic: General - Other >> Feb 08, 2024  2:33 PM Donna BRAVO wrote: Reason for CRM: patient asking where can she get her true weight without the wheel chair.   Patient was informed they will receive a call back within 1 business day.

## 2024-02-09 NOTE — Telephone Encounter (Signed)
 Called and spoke with the health department and they said for the patient to reach out to the nearest hospital to see if they could do it. Tried to call patient back to advise her of what I found out and I could not not get an answer. Please advise when she calls back.

## 2024-02-23 ENCOUNTER — Other Ambulatory Visit: Payer: Self-pay | Admitting: Family

## 2024-02-23 DIAGNOSIS — G6289 Other specified polyneuropathies: Secondary | ICD-10-CM

## 2024-03-08 ENCOUNTER — Ambulatory Visit: Payer: Self-pay

## 2024-03-08 NOTE — Telephone Encounter (Signed)
 Pt unavailable for appt before Friday 03/10/24, scheduled with DOD in PCP office.  FYI Only or Action Required?: FYI only for provider: appointment scheduled on 03/10/24.  Patient was last seen in primary care on 02/04/2024 by Lavell Bari LABOR, FNP.  Called Nurse Triage reporting Pain.  Symptoms began several months ago.  Interventions attempted: Rest, hydration, or home remedies.  Symptoms are: gradually worsening.  Triage Disposition: See PCP When Office is Open (Within 3 Days)  Patient/caregiver understands and will follow disposition?: Yes Reason for Disposition  [1] MODERATE pain (e.g., interferes with normal activities) AND [2] present > 3 days  Answer Assessment - Initial Assessment Questions Pt experiencing bilateral arm pain, right is worse. Seen for same recently. States never improved. Pain is primarily in the bicep and worse with reaching or lifting herself. Patient is wheelchair bound and relies on her arms for mobility.   1. ONSET: When did the pain start?     Chronic  2. LOCATION: Where is the pain located?     Bilateral biceps  3. PAIN: How bad is the pain? (Scale 0-10; or none, mild, moderate, severe)     Severe  4. WORK OR EXERCISE: Has there been any recent work or exercise that involved this part of the body?     Lifting herself, using wheelchair  5. OTHER SYMPTOMS: Do you have any other symptoms? (e.g., neck pain, swelling, rash, fever, numbness, weakness)     Denies  Protocols used: Arm Pain-A-AH Copied from CRM #[id number]. Topic: Clinical - Red Word Triage >> Mar 08, 2024  2:47 PM Debby BROCKS wrote: Red Word that prompted transfer to Nurse Triage: Patient currently has pain in her arm whenever she uses it to try to get out of her wheelchair or put any strength on it. It builds up to excruciating pain when attempting to use it, but lessens when arm is stationary

## 2024-03-09 NOTE — Telephone Encounter (Signed)
 Apt scheduled.

## 2024-03-10 ENCOUNTER — Ambulatory Visit

## 2024-03-14 ENCOUNTER — Ambulatory Visit: Admitting: Family

## 2024-03-16 ENCOUNTER — Encounter: Payer: Self-pay | Admitting: Family

## 2024-03-16 ENCOUNTER — Ambulatory Visit

## 2024-03-16 ENCOUNTER — Ambulatory Visit: Admitting: Family

## 2024-03-16 VITALS — BP 130/87 | HR 103 | Temp 98.0°F | Ht 66.0 in

## 2024-03-16 DIAGNOSIS — F17200 Nicotine dependence, unspecified, uncomplicated: Secondary | ICD-10-CM | POA: Diagnosis not present

## 2024-03-16 DIAGNOSIS — S343XXS Injury of cauda equina, sequela: Secondary | ICD-10-CM

## 2024-03-16 DIAGNOSIS — F411 Generalized anxiety disorder: Secondary | ICD-10-CM | POA: Diagnosis not present

## 2024-03-16 DIAGNOSIS — R5383 Other fatigue: Secondary | ICD-10-CM | POA: Diagnosis not present

## 2024-03-16 DIAGNOSIS — Z0000 Encounter for general adult medical examination without abnormal findings: Secondary | ICD-10-CM | POA: Diagnosis not present

## 2024-03-16 DIAGNOSIS — M25512 Pain in left shoulder: Secondary | ICD-10-CM | POA: Diagnosis not present

## 2024-03-16 DIAGNOSIS — M25511 Pain in right shoulder: Secondary | ICD-10-CM | POA: Diagnosis not present

## 2024-03-16 DIAGNOSIS — Z993 Dependence on wheelchair: Secondary | ICD-10-CM

## 2024-03-16 NOTE — Patient Instructions (Signed)
Shoulder Pain Many things can cause shoulder pain, including: An injury to the shoulder. Overuse of the shoulder. Arthritis. The source of the pain can be: Inflammation. An injury to the shoulder joint. An injury to a tendon, ligament, or bone. Follow these instructions at home: Pay attention to changes in your symptoms. Let your health care provider know about them. Follow these instructions to relieve your pain. If you have a removable sling: Wear the sling as told by your provider. Remove it only as told by your provider. Check the skin around the sling every day. Tell your provider about any concerns. Loosen the sling if your fingers tingle, become numb, or become cold. Keep the sling clean. If the sling is not waterproof: Do not let it get wet. Remove it to shower or bathe. Move your arm as little as possible, but keep your hand moving to prevent swelling. Managing pain, stiffness, and swelling  If told, put ice on the painful area. If you have a removable sling or immobilizer, remove it as told by your provider. Put ice in a plastic bag. Place a towel between your skin and the bag. Leave the ice on for 20 minutes, 2-3 times a day. If your skin turns bright red, remove the ice right away to prevent skin damage. The risk of damage is higher if you cannot feel pain, heat, or cold. Move your fingers often to reduce stiffness and swelling. Squeeze a soft ball or a foam pad as much as possible. This helps to keep the shoulder from swelling. It also helps to strengthen the arm. General instructions Take over-the-counter and prescription medicines only as told by your provider. Exercise may help with pain management. Perform exercises if told by your provider. You may be referred to a physical therapist to help in your recovery process. Keep all follow-up visits in order to avoid any type of permanent shoulder disability or chronic pain problems. Contact a health care provider  if: Your pain is not relieved with medicines. New pain develops in your arm, hand, or fingers. You loosen your sling and your arm, hand, or fingers remain tingly, numb, swollen, or painful. Get help right away if: Your arm, hand, or fingers turn white or blue. This information is not intended to replace advice given to you by your health care provider. Make sure you discuss any questions you have with your health care provider. Document Revised: 10/24/2021 Document Reviewed: 10/24/2021 Elsevier Patient Education  2024 Elsevier Inc.  

## 2024-03-16 NOTE — Progress Notes (Signed)
 Subjective:    Patient ID: Kim [name], female    DOB: 01/28/1976, 48 y.o.   MRN: [id number]  Chief Complaint  Patient presents with   Arm Pain    Right arm and left arm. Right is the worse no injury hurts with ROM   Pt presents to the office today with CPE and bilateral bicep and shoulder pain that has been on going for 3 months. Denies any injury. Reports it was improving, but now worsening.   She has spine cord injury and wheelchair bound.  Arm Pain  The incident occurred more than 1 week ago. There was no injury mechanism. The pain is present in the right shoulder and left shoulder. The quality of the pain is described as aching. The pain radiates to the right arm. The pain is at a severity of 8/10. The pain is moderate. The pain has been Intermittent since the incident. Pertinent negatives include no numbness or tingling. Nothing aggravates the symptoms. She has tried rest, heat, NSAIDs and weight bearing for the symptoms. The treatment provided mild relief.  Nicotine  Dependence Presents for follow-up visit. Symptoms include irritability. Her urge triggers include company of smokers. The symptoms have been stable. She smokes < 1/2 a pack of cigarettes per day.  Anxiety Presents for follow-up visit. Symptoms include excessive worry, irritability, nervous/anxious behavior and restlessness. Symptoms occur occasionally. The severity of symptoms is mild.        Review of Systems  Constitutional:  Positive for irritability.  Neurological:  Negative for tingling and numbness.  Psychiatric/Behavioral:  The patient is nervous/anxious.   [name] other systems reviewed and are negative.   Social History   Socioeconomic History   Marital status: Single    Spouse name: Not on file   Number of children: 2   Years of education: Not on file   Highest education level: Not on file  Occupational History   Occupation: disability  Tobacco Use   Smoking status: Every Day    Current  packs/day: 0.50    Average packs/day: 0.5 packs/day for 20.0 years (10.0 ttl pk-yrs)    Types: Cigarettes   Smokeless tobacco: Never   Tobacco comments:    States cutting back  Vaping Use   Vaping status: Never Used  Substance and Sexual Activity   Alcohol  use: No   Drug use: Yes    Types: Marijuana    Comment: occasionally   Sexual activity: Not Currently    Birth control/protection: Post-menopausal  Other Topics Concern   Not on file  Social History Narrative   05/19/2021 - Her 2 sons live with her - age 25 and 38   05/19/2021 - she has PT, OT, RN HH twice per week and aide with her daily   Social Drivers of Health   Tobacco Use: High Risk (03/16/2024)   Patient History    Smoking Tobacco Use: Every Day    Smokeless Tobacco Use: Never    Passive Exposure: Not on file  Financial Resource Strain: Low Risk (08/09/2023)   Overall Financial Resource Strain (CARDIA)    Difficulty of Paying Living Expenses: Not very hard  Food Insecurity: No Food Insecurity (10/26/2023)   Epic    Worried About Radiation Protection Practitioner of Food in the Last Year: Never true    Ran Out of Food in the Last Year: Never true  Transportation Needs: No Transportation Needs (10/26/2023)   Epic    Lack of Transportation (Medical): No    Lack of Transportation (  Non-Medical): No  Physical Activity: Insufficiently Active (10/26/2023)   Exercise Vital Sign    Days of Exercise per Week: 3 days    Minutes of Exercise per Session: 20 min  Stress: Stress Concern Present (10/26/2023)   [employer] of Occupational Health - Occupational Stress Questionnaire    Feeling of Stress: To some extent  Social Connections: Moderately Isolated (10/26/2023)   Social Connection and Isolation Panel    Frequency of Communication with Friends and Family: More than three times a week    Frequency of Social Gatherings with Friends and Family: More than three times a week    Attends Religious Services: More than 4 times per year    Active  Member of Clubs or Organizations: No    Attends Banker Meetings: Never    Marital Status: Never married  Depression (PHQ2-9): Medium Risk (03/16/2024)   Depression (PHQ2-9)    PHQ-2 Score: 6  Alcohol  Screen: Low Risk (10/26/2023)   Alcohol  Screen    Last Alcohol  Screening Score (AUDIT): 0  Housing: Unknown (10/26/2023)   Epic    Unable to Pay for Housing in the Last Year: No    Number of Times Moved in the Last Year: Not on file    Homeless in the Last Year: No  Utilities: Not At Risk (10/26/2023)   Epic    Threatened with loss of utilities: No  Health Literacy: Adequate Health Literacy (10/26/2023)   B1300 Health Literacy    Frequency of need for help with medical instructions: Never   Family History  Problem Relation Age of Onset   Cancer Mother    Rheum arthritis Mother    Diabetes Mother    Mental illness Mother    Mental illness Father    Cancer Other    Diabetes Other    Heart attack Maternal Grandmother    Cancer Maternal Grandmother    ADD / ADHD Son    SIDS Son         Objective:   Physical Exam Vitals reviewed.  Constitutional:      General: She is not in acute distress.    Appearance: She is well-developed.  HENT:     Head: Normocephalic and atraumatic.     Right Ear: Tympanic membrane normal.     Left Ear: Tympanic membrane normal.  Eyes:     Pupils: Pupils are equal, round, and reactive to light.  Neck:     Thyroid : No thyromegaly.  Cardiovascular:     Rate and Rhythm: Normal rate and regular rhythm.     Heart sounds: Normal heart sounds. No murmur heard. Pulmonary:     Effort: Pulmonary effort is normal. No respiratory distress.     Breath sounds: Normal breath sounds. No wheezing.  Abdominal:     General: Bowel sounds are normal. There is no distension.     Palpations: Abdomen is soft.     Tenderness: There is no abdominal tenderness.  Musculoskeletal:        General: No tenderness. Normal range of motion.     Cervical back:  Normal range of motion and neck supple.  Skin:    General: Skin is warm and dry.  Neurological:     Mental Status: She is alert and oriented to person, place, and time.     Cranial Nerves: No cranial nerve deficit.     Deep Tendon Reflexes: Reflexes are normal and symmetric.  Psychiatric:        Behavior: Behavior normal.  Thought Content: Thought content normal.        Judgment: Judgment normal.       BP 130/87   Pulse (!) 103   Temp 98 F (36.7 C) (Temporal)   Ht 5' 6 (1.676 m)   LMP 11/24/2012   BMI 22.27 kg/m      Assessment & Plan:  Kim [name] comes in today with chief complaint of Arm Pain (Right arm and left arm. Right is the worse no injury hurts with ROM)   Diagnosis and orders addressed:  1. Acute pain of right shoulder Rest Ice ROM exercises encouraged Referral to Ortho pending  - DG Shoulder Right; Future - CMP14+EGFR - Ambulatory referral to Orthopedic Surgery  2. Acute pain of left shoulder Rest Ice ROM exercises encouraged Referral to Ortho pending  - CMP14+EGFR - Ambulatory referral to Orthopedic Surgery  3. Cauda equina injury without spinal bone injury, sequela - CMP14+EGFR  4. GAD (generalized anxiety disorder) Stress management  - CMP14+EGFR  5. Annual physical exam (Primary) - CMP14+EGFR - Lipid panel - TSH - Anemia Profile B  6. Current smoker Smoking cessation  - CMP14+EGFR  7. Wheelchair dependent - CMP14+EGFR  8. Other fatigue - CMP14+EGFR - TSH - Anemia Profile B   Labs pending Continue current medications  Keep follow up with specialists  Health Maintenance reviewed Diet and exercise encouraged  Return if symptoms worsen or fail to improve.    Bari Learn, FNP

## 2024-03-17 ENCOUNTER — Other Ambulatory Visit: Payer: Self-pay | Admitting: Family

## 2024-03-17 ENCOUNTER — Ambulatory Visit: Payer: Self-pay | Admitting: Family

## 2024-03-17 DIAGNOSIS — F17200 Nicotine dependence, unspecified, uncomplicated: Secondary | ICD-10-CM

## 2024-03-17 DIAGNOSIS — Z716 Tobacco abuse counseling: Secondary | ICD-10-CM

## 2024-03-17 DIAGNOSIS — F320 Major depressive disorder, single episode, mild: Secondary | ICD-10-CM

## 2024-03-17 LAB — ANEMIA PROFILE B
Basophils Absolute: 0.1 x10E3/uL (ref 0.0–0.2)
Basos: 1 %
EOS (ABSOLUTE): 0 x10E3/uL (ref 0.0–0.4)
Eos: 0 %
Ferritin: 33 ng/mL (ref 15–150)
Folate: 10.8 ng/mL (ref >3.0)
Hematocrit: 47 % — ABNORMAL HIGH (ref 34.0–46.6)
Hemoglobin: 15.3 g/dL (ref 11.1–15.9)
Immature Grans (Abs): 0 x10E3/uL (ref 0.0–0.1)
Immature Granulocytes: 0 %
Iron Saturation: 17 % (ref 15–55)
Iron: 64 ug/dL (ref 27–159)
Lymphocytes Absolute: 1.9 x10E3/uL (ref 0.7–3.1)
Lymphs: 27 %
MCH: 30.5 pg (ref 26.6–33.0)
MCHC: 32.6 g/dL (ref 31.5–35.7)
MCV: 94 fL (ref 79–97)
Monocytes Absolute: 0.4 x10E3/uL (ref 0.1–0.9)
Monocytes: 5 %
Neutrophils Absolute: 4.8 x10E3/uL (ref 1.4–7.0)
Neutrophils: 67 %
Platelets: 221 x10E3/uL (ref 150–450)
RBC: 5.01 x10E6/uL (ref 3.77–5.28)
RDW: 13.2 % (ref 11.7–15.4)
Retic Ct Pct: 1 % (ref 0.6–2.6)
Total Iron Binding Capacity: 371 ug/dL (ref 250–450)
UIBC: 307 ug/dL (ref 131–425)
Vitamin B-12: 520 pg/mL (ref 232–1245)
WBC: 7.2 x10E3/uL (ref 3.4–10.8)

## 2024-03-17 LAB — LIPID PANEL
Chol/HDL Ratio: 4.2 ratio (ref 0.0–4.4)
Cholesterol, Total: 212 mg/dL — ABNORMAL HIGH (ref 100–199)
HDL: 50 mg/dL (ref >39)
LDL Chol Calc (NIH): 136 mg/dL — ABNORMAL HIGH (ref 0–99)
Triglycerides: 145 mg/dL (ref 0–149)
VLDL Cholesterol Cal: 26 mg/dL (ref 5–40)

## 2024-03-17 LAB — CMP14+EGFR
ALT: 11 IU/L (ref 0–32)
AST: 16 IU/L (ref 0–40)
Albumin: 4 g/dL (ref 3.9–4.9)
Alkaline Phosphatase: 159 IU/L — ABNORMAL HIGH (ref 41–116)
BUN/Creatinine Ratio: 16 (ref 9–23)
BUN: 9 mg/dL (ref 6–24)
Bilirubin Total: 0.3 mg/dL (ref 0.0–1.2)
CO2: 27 mmol/L (ref 20–29)
Calcium: 9 mg/dL (ref 8.7–10.2)
Chloride: 98 mmol/L (ref 96–106)
Creatinine, Ser: 0.56 mg/dL — ABNORMAL LOW (ref 0.57–1.00)
Globulin, Total: 3.5 g/dL (ref 1.5–4.5)
Glucose: 96 mg/dL (ref 70–99)
Potassium: 4.9 mmol/L (ref 3.5–5.2)
Sodium: 136 mmol/L (ref 134–144)
Total Protein: 7.5 g/dL (ref 6.0–8.5)
eGFR: 114 mL/min/1.73 (ref >59)

## 2024-03-17 LAB — TSH: TSH: 1.4 u[IU]/mL (ref 0.450–4.500)

## 2024-04-12 ENCOUNTER — Ambulatory Visit: Admitting: Surgical

## 2024-04-19 ENCOUNTER — Ambulatory Visit: Admitting: Surgical

## 2024-04-19 DIAGNOSIS — M7542 Impingement syndrome of left shoulder: Secondary | ICD-10-CM | POA: Diagnosis not present

## 2024-04-23 ENCOUNTER — Encounter: Payer: Self-pay | Admitting: Surgical

## 2024-04-23 MED ORDER — METHYLPREDNISOLONE ACETATE 40 MG/ML IJ SUSP
40.0000 mg | INTRAMUSCULAR | Status: AC | PRN
  Administered 2024-04-19: 40 mg via INTRA_ARTICULAR

## 2024-04-23 MED ORDER — BUPIVACAINE HCL 0.5 % IJ SOLN
9.0000 mL | INTRAMUSCULAR | Status: AC | PRN
  Administered 2024-04-19: 9 mL via INTRA_ARTICULAR

## 2024-04-23 MED ORDER — LIDOCAINE HCL 1 % IJ SOLN
5.0000 mL | INTRAMUSCULAR | Status: AC | PRN
  Administered 2024-04-19: 5 mL
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
## 2024-05-02 ENCOUNTER — Ambulatory Visit: Admitting: Podiatry

## 2024-05-04 ENCOUNTER — Ambulatory Visit: Admitting: Podiatry

## 2024-10-26 ENCOUNTER — Ambulatory Visit: Payer: Self-pay
